# Patient Record
Sex: Female | Born: 1955 | Race: White | Hispanic: No | Marital: Married | Smoking: Former smoker
Health system: Southern US, Community
[De-identification: ages and names within clinical notes are randomized; demographics above are authoritative.]

## PROBLEM LIST (undated history)

## (undated) DIAGNOSIS — E785 Hyperlipidemia, unspecified: Secondary | ICD-10-CM

## (undated) DIAGNOSIS — D649 Anemia, unspecified: Secondary | ICD-10-CM

## (undated) DIAGNOSIS — F419 Anxiety disorder, unspecified: Secondary | ICD-10-CM

## (undated) DIAGNOSIS — Z9889 Other specified postprocedural states: Secondary | ICD-10-CM

## (undated) DIAGNOSIS — Z8489 Family history of other specified conditions: Secondary | ICD-10-CM

## (undated) DIAGNOSIS — R7303 Prediabetes: Secondary | ICD-10-CM

## (undated) DIAGNOSIS — T4145XA Adverse effect of unspecified anesthetic, initial encounter: Secondary | ICD-10-CM

## (undated) DIAGNOSIS — L409 Psoriasis, unspecified: Secondary | ICD-10-CM

## (undated) DIAGNOSIS — T8859XA Other complications of anesthesia, initial encounter: Secondary | ICD-10-CM

## (undated) DIAGNOSIS — I6529 Occlusion and stenosis of unspecified carotid artery: Secondary | ICD-10-CM

## (undated) DIAGNOSIS — R51 Headache: Secondary | ICD-10-CM

## (undated) DIAGNOSIS — R112 Nausea with vomiting, unspecified: Secondary | ICD-10-CM

## (undated) DIAGNOSIS — K219 Gastro-esophageal reflux disease without esophagitis: Secondary | ICD-10-CM

## (undated) DIAGNOSIS — H269 Unspecified cataract: Secondary | ICD-10-CM

## (undated) DIAGNOSIS — I2510 Atherosclerotic heart disease of native coronary artery without angina pectoris: Secondary | ICD-10-CM

## (undated) DIAGNOSIS — M1990 Unspecified osteoarthritis, unspecified site: Secondary | ICD-10-CM

## (undated) DIAGNOSIS — E039 Hypothyroidism, unspecified: Secondary | ICD-10-CM

## (undated) DIAGNOSIS — G709 Myoneural disorder, unspecified: Secondary | ICD-10-CM

## (undated) HISTORY — PX: DILATION AND CURETTAGE OF UTERUS: SHX78

## (undated) HISTORY — PX: TUBAL LIGATION: SHX77

---

## 2004-12-09 ENCOUNTER — Ambulatory Visit: Payer: Self-pay

## 2007-02-05 ENCOUNTER — Other Ambulatory Visit: Admission: RE | Admit: 2007-02-05 | Discharge: 2007-02-05 | Payer: Self-pay | Admitting: Gynecology

## 2007-03-26 HISTORY — PX: ABDOMINAL HYSTERECTOMY: SHX81

## 2007-04-01 ENCOUNTER — Ambulatory Visit (HOSPITAL_BASED_OUTPATIENT_CLINIC_OR_DEPARTMENT_OTHER): Admission: RE | Admit: 2007-04-01 | Discharge: 2007-04-01 | Payer: Self-pay | Admitting: Gynecology

## 2007-04-01 ENCOUNTER — Encounter: Payer: Self-pay | Admitting: Gynecology

## 2008-04-03 ENCOUNTER — Ambulatory Visit: Payer: Self-pay | Admitting: Internal Medicine

## 2008-04-28 ENCOUNTER — Ambulatory Visit: Payer: Self-pay | Admitting: Gynecology

## 2008-04-28 ENCOUNTER — Encounter: Payer: Self-pay | Admitting: Gynecology

## 2008-04-28 ENCOUNTER — Other Ambulatory Visit: Admission: RE | Admit: 2008-04-28 | Discharge: 2008-04-28 | Payer: Self-pay | Admitting: Gynecology

## 2008-07-24 ENCOUNTER — Ambulatory Visit: Payer: Self-pay | Admitting: Gynecology

## 2008-07-28 ENCOUNTER — Ambulatory Visit: Payer: Self-pay | Admitting: Gynecology

## 2008-08-25 HISTORY — PX: THYROID SURGERY: SHX805

## 2009-04-25 DIAGNOSIS — E039 Hypothyroidism, unspecified: Secondary | ICD-10-CM

## 2009-04-25 HISTORY — DX: Hypothyroidism, unspecified: E03.9

## 2009-05-29 ENCOUNTER — Ambulatory Visit: Payer: Self-pay | Admitting: Internal Medicine

## 2010-10-30 ENCOUNTER — Ambulatory Visit: Payer: Self-pay | Admitting: Internal Medicine

## 2011-01-07 NOTE — H&P (Signed)
NAME:  Annette [name], Annette [name] NO.:  [id number]   MEDICAL RECORD NO.:  [id number]          PATIENT TYPE:  AMB   LOCATION:  [facility]                         FACILITY:  [facility]   PHYSICIAN:  Juan H. Lily Peer, M.D.DATE OF BIRTH:  March 31, 1956   DATE OF ADMISSION:  DATE OF DISCHARGE:                              HISTORY & PHYSICAL   The patient is scheduled for surgery in [facility] on  Thursday, August 7th at 1:00 p.m.  Please have history and physical  available.   CHIEF COMPLAINT:  Menomenorrhagia and left ovarian cyst.   HISTORY:  The patient is a 55 year old gravida 3, para 2, AB 1, who is  scheduled to undergo a laparoscopic-assisted vaginal hysterectomy and  bilateral salpingo-oophorectomy in August 2008, secondary to  menomenorrhagia and ovarian cyst.  Her evaluation had consisted of  endometrial biopsy with proliferative endometrium.  Sonohystogram  demonstrated endometrial polyp, and her ultrasound recently had  demonstrated at that time of the sonohystogram that she had a new  vascular thin-wall cyst measuring 25 x 17 x 16 mm on the left ovary and  a thickened endometrium.  She had been kept on Megace 20 mg that was  increased to 40 mg to help her bleeding.   PAST MEDICAL HISTORY:  SHE IS ALLERGIC TO PENICILLIN.   PAST SURGICAL HISTORY:  She has had two cesarean sections.  She has a  midline Pfannenstiel scar.  She has had one spontaneous AB.   MEDICATIONS:  1. Cymbalta 30 mg q. daily for depression.  2. Megace 40 mg she is taking b.i.d. to help with her bleeding.  She      was treated for upper respiratory tract infection back in July, and      her hospitalizations have included two c-sections and one D&C.   FAMILY HISTORY:  Sister with non-insulin dependent diabetes.   PHYSICAL EXAMINATION:  VITAL SIGNS:  The patient weighs 162 pounds.  HEENT:  Unremarkable.  GENERAL:  Well-developed, well-nourished female in no acute distress.  NECK:  Supple,  trachea midline. No carotid bruits, no thyromegaly.  LUNGS:  Clear to auscultation without rhonchi or wheezes.  HEART:  Regular rate and rhythm without any murmurs or gallops.  BREAST:  Exam not done.  ABDOMEN:  Soft, nontender, no rebound or guarding.  PELVIC:  Bartholin's, urethra and Skene was within normal limits.  Vagina with some menstrual blood present.  Bimanual examination:  Anteverted uterus, upper limits of normal.  No palpable adnexal masses.  RECTAL:  Exam deferred.   ASSESSMENT:  A 55 year old gravida 3, para 2, AB 1 with menomenorrhagia,  evidence of endometrial polyp and right ovarian cyst.  The patient now  menopausal.  The patient is scheduled to proceed with a laparoscopic-  assisted vaginal hysterectomy and bilateral salpingo-oophorectomy.  The  risks, benefits and pros and cons of the operation have been discussed,  including infection, bleeding, trauma to internal organs.  In the event  of technical difficulty, open laparotomy technique may need to be  performed to complete the operation.  The risk of blood transfusion and  potential risk  of anaphylactic reaction, hepatitis and AIDs were  discussed as well.  All of these issues were discussed with the patient,  and all questions were answered and will follow accordingly to plan.  The patient was scheduled for laparoscopic-assisted vaginal  hysterectomy, bilateral salpingo-oophorectomy Thursday August 7th at  1:00 p.m. at [facility].  Please have history and  physical available.      Juan H. Lily Peer, M.D.  Electronically Signed     JHF/MEDQ  D:  03/31/2007  T:  03/31/2007  Job:  [id number]

## 2011-01-07 NOTE — Op Note (Signed)
NAME:  Annette [name], Annette [name] NO.:  [id number]   MEDICAL RECORD NO.:  [id number]          PATIENT TYPE:  AMB   LOCATION:  [facility]                         FACILITY:  [facility]   PHYSICIAN:  Juan H. Lily Peer, M.D.DATE OF BIRTH:  [birth year]   DATE OF PROCEDURE:  04/01/2007  DATE OF DISCHARGE:                               OPERATIVE REPORT   SURGEON:  Juan H. Lily Peer, M.D.   FIRST ASSISTANT:  Colin Broach, M.D.   INDICATIONS FOR OPERATION:  A 55 year old, gravida 3, para 2, AB1 with  symptomatic leiomyomatous uteri contributing to dysmenorrhea and  menorrhagia, along with a small left ovarian cyst.   PREOPERATIVE DIAGNOSES:  1. Symptomatic leiomyomatous uteri.  2. Left ovarian cyst.   POSTOPERATIVE DIAGNOSES:  1. Symptomatic leiomyomatous uteri.  2. Left ovarian cyst.   ANESTHESIA:  General endotracheal anesthesia.   SURGEON:  Juan H. Lily Peer, M.D.   FIRST ASSISTANT:  Colin Broach, M.D.   PROCEDURE PERFORMED:  Laparoscopic-assisted vaginal hysterectomy with  bilateral salpingo-oophorectomy.   FINDINGS:  A globular-appearing uterus, small what appeared to be a  hemorrhagic cyst of the left ovary.  No other abnormalities noted in the  pelvic cavity.   DESCRIPTION OF OPERATION:  After the patient was adequately counseled,  she was taken to the operating room where she underwent a successful  general endotracheal anesthesia.  The abdomen and vagina were prepped  and draped in usual sterile fashion.  The patient had received a gram of  cefoxitin IV for prophylaxis, and a Foley catheter had been placed for  monitoring urinary output.  She did have PSA stockings for DVT  prophylaxis.  Her legs were placed in the low lithotomy position.  Once  the drapes were in place, a small stab incision was made underneath the  umbilicus, and with the OptiVu, the peritoneal cavity was entered under  direct visualization with a laparoscopic and the OptiVu trocar.  The  sheath was left in place.  The trocar was removed.  The scope was  inserted.  The pneumoperitoneum was established to approximately 3  liters of carbon oxide.  Two additional ports were made approximately  five fingerbreadths from the midline in the lower abdomen on each side  under laparoscopic guidance.  Attention was placed to the right adnexal  region.  The right tube and ovary were placed on traction.  The right  infundibulopelvic ligament was identified, and the ureter was found to  be far from the infundibulopelvic ligament, and with the tripolar unit,  the infundibulopelvic ligament was cauterized and transected as was the  remainder of the round ligament, and the bladder flap was established  with the Endo shears to the level of the anterior cervical os.  Similar  procedure was carried out on the contralateral side, and both sides of  the parametrial tissue was cauterized and transected to include up to  the level of the uterine artery to the level of both lateral fornices.  The second part of the operation consisted of placing the patient in  high lithotomy position and completing the hysterectomy.  A short-  weighted speculum  was placed in the posterior vaginal vault.  Anterior,  posterior and cervical lip were grasped with a Lahey thyroid clamp and  placed under traction.  One percent lidocaine with 1:100,000 epinephrine  was infiltrated to the cervicovaginal fold in a circumferential manner  for a total 10 mL.  With a scalpel, a circumferential incision was made  at the cervicovaginal fold, and the posterior colpotomy was  accomplished, and the short-weighted speculum was changed for a long-  weighted billed speculum.  Both uterosacral ligaments were then clamped,  cut and suture ligated with 0 Vicryl, sutured and tagged.  An anterior  colpotomy was established, and the Deaver retractor was placed in the  anterior peritoneal cavity.  The remaining broad and cardinal ligaments   were serially clamped, cut and suture ligated with 0 Vicryl suture.  The  uterus required morcellation in an effort to be removed from the pelvic  cavity where the remaining pedicles were clamped, cut and suture  ligated.  The uterus, tubes and ovary were passed off the operative  field.  The area was copiously irrigated with normal saline solution.  After ascertaining adequate hemostasis, the short-weighted speculum was  then placed, and the long-weighted billed speculum was passed off the  operative field.  The posterior vaginal mucosa and peritoneum were  secured with a running stitch of 0 Vicryl suture in a basal stitch like  fashion, and the remaining vaginal cuff was then closed with interrupted  sutures of 0 Vicryl suture.  Once again, a second look of the pelvic  cavity was accomplished with the laparoscope.  After the  pneumoperitoneum was reestablished, the pelvic cavity was copiously  irrigated with normal saline solution.  Both infundibulopelvic ligaments  appeared to be dry, and the vaginal cuff was dry as well.  The  pneumoperitoneum was removed.  Both 5-mm trocars and 10-mm cord trocar  were removed.  The subumbilical fascial incision was closed with running  stitch of 0 Vicryl suture, and the subcutaneous tissue was  reapproximated with 3-0 Vicryl suture.  Both 5-mm trocar sites were  closed with Dermabond glue superficially.  Three Band-Aids were placed  on each incision port.  Prior to that, 0.25% percent Marcaine was  infiltrated at all three incision sites for postoperative analgesia for  a total 10 mL.  The patient was extubated and transferred to recovery  room with stable vital signs.  Blood loss was 300 mL, IV fluids 1700 mL  of lactated Ringer's and urine output was 350 mL.  Uterine weight was  232 grams.      Juan H. Lily Peer, M.D.  Electronically Signed     JHF/MEDQ  D:  04/01/2007  T:  04/02/2007  Job:  [id number]

## 2011-06-09 LAB — CBC
HCT: 29.3 — ABNORMAL LOW
MCV: 88.9
Platelets: 362
RDW: 13.3

## 2011-06-09 LAB — DIFFERENTIAL
Basophils Absolute: 0
Basophils Relative: 0
Eosinophils Absolute: 0.1
Eosinophils Relative: 1

## 2012-03-23 ENCOUNTER — Ambulatory Visit: Payer: Self-pay | Admitting: Internal Medicine

## 2012-11-07 ENCOUNTER — Emergency Department: Payer: Self-pay | Admitting: Emergency Medicine

## 2013-03-24 ENCOUNTER — Ambulatory Visit: Payer: Self-pay | Admitting: Internal Medicine

## 2013-10-26 ENCOUNTER — Ambulatory Visit: Payer: Self-pay | Admitting: Internal Medicine

## 2013-10-28 ENCOUNTER — Ambulatory Visit: Payer: Self-pay | Admitting: Internal Medicine

## 2014-01-04 DIAGNOSIS — M1990 Unspecified osteoarthritis, unspecified site: Secondary | ICD-10-CM | POA: Insufficient documentation

## 2014-01-04 DIAGNOSIS — G43909 Migraine, unspecified, not intractable, without status migrainosus: Secondary | ICD-10-CM | POA: Insufficient documentation

## 2014-01-04 DIAGNOSIS — L409 Psoriasis, unspecified: Secondary | ICD-10-CM | POA: Insufficient documentation

## 2014-01-31 ENCOUNTER — Ambulatory Visit: Payer: Self-pay | Admitting: Rheumatology

## 2014-05-02 DIAGNOSIS — S83239A Complex tear of medial meniscus, current injury, unspecified knee, initial encounter: Secondary | ICD-10-CM | POA: Insufficient documentation

## 2014-05-12 ENCOUNTER — Ambulatory Visit: Payer: Self-pay | Admitting: Unknown Physician Specialty

## 2014-05-12 HISTORY — PX: KNEE ARTHROSCOPY W/ PARTIAL MEDIAL MENISCECTOMY: SHX1882

## 2014-08-15 DIAGNOSIS — M25461 Effusion, right knee: Secondary | ICD-10-CM | POA: Insufficient documentation

## 2014-12-20 ENCOUNTER — Other Ambulatory Visit: Payer: Self-pay | Admitting: Internal Medicine

## 2014-12-20 DIAGNOSIS — E785 Hyperlipidemia, unspecified: Secondary | ICD-10-CM | POA: Insufficient documentation

## 2014-12-20 DIAGNOSIS — F32A Depression, unspecified: Secondary | ICD-10-CM | POA: Insufficient documentation

## 2014-12-20 DIAGNOSIS — Z1231 Encounter for screening mammogram for malignant neoplasm of breast: Secondary | ICD-10-CM

## 2014-12-20 DIAGNOSIS — E890 Postprocedural hypothyroidism: Secondary | ICD-10-CM | POA: Insufficient documentation

## 2014-12-20 DIAGNOSIS — F419 Anxiety disorder, unspecified: Secondary | ICD-10-CM

## 2014-12-20 DIAGNOSIS — F329 Major depressive disorder, single episode, unspecified: Secondary | ICD-10-CM | POA: Insufficient documentation

## 2015-01-09 ENCOUNTER — Ambulatory Visit
Admission: RE | Admit: 2015-01-09 | Discharge: 2015-01-09 | Disposition: A | Discharge status: Home / Self Care | Payer: PRIVATE HEALTH INSURANCE | Source: Ambulatory Visit | Attending: Internal Medicine | Admitting: Internal Medicine

## 2015-01-09 DIAGNOSIS — Z1231 Encounter for screening mammogram for malignant neoplasm of breast: Secondary | ICD-10-CM | POA: Diagnosis not present

## 2015-08-22 ENCOUNTER — Other Ambulatory Visit: Payer: Self-pay | Admitting: Unknown Physician Specialty

## 2015-08-22 DIAGNOSIS — M25562 Pain in left knee: Secondary | ICD-10-CM

## 2015-09-05 ENCOUNTER — Ambulatory Visit: Payer: PRIVATE HEALTH INSURANCE

## 2015-09-11 ENCOUNTER — Encounter: Payer: Self-pay | Admitting: *Deleted

## 2015-09-14 ENCOUNTER — Encounter
Admission: RE | Disposition: A | Discharge status: Home / Self Care | Payer: Self-pay | Source: Ambulatory Visit | Attending: Unknown Physician Specialty

## 2015-09-14 ENCOUNTER — Ambulatory Visit: Payer: PRIVATE HEALTH INSURANCE | Admitting: Anesthesiology

## 2015-09-14 ENCOUNTER — Ambulatory Visit
Admission: RE | Admit: 2015-09-14 | Discharge: 2015-09-14 | Disposition: A | Discharge status: Home / Self Care | Payer: PRIVATE HEALTH INSURANCE | Source: Ambulatory Visit | Attending: Unknown Physician Specialty | Admitting: Unknown Physician Specialty

## 2015-09-14 ENCOUNTER — Encounter: Payer: Self-pay | Admitting: *Deleted

## 2015-09-14 DIAGNOSIS — M94262 Chondromalacia, left knee: Secondary | ICD-10-CM | POA: Diagnosis not present

## 2015-09-14 DIAGNOSIS — E890 Postprocedural hypothyroidism: Secondary | ICD-10-CM | POA: Diagnosis not present

## 2015-09-14 DIAGNOSIS — M1991 Primary osteoarthritis, unspecified site: Secondary | ICD-10-CM | POA: Diagnosis not present

## 2015-09-14 DIAGNOSIS — Z9889 Other specified postprocedural states: Secondary | ICD-10-CM | POA: Insufficient documentation

## 2015-09-14 DIAGNOSIS — Z90710 Acquired absence of both cervix and uterus: Secondary | ICD-10-CM | POA: Insufficient documentation

## 2015-09-14 DIAGNOSIS — S83242A Other tear of medial meniscus, current injury, left knee, initial encounter: Secondary | ICD-10-CM | POA: Insufficient documentation

## 2015-09-14 DIAGNOSIS — Z803 Family history of malignant neoplasm of breast: Secondary | ICD-10-CM | POA: Insufficient documentation

## 2015-09-14 DIAGNOSIS — Z881 Allergy status to other antibiotic agents status: Secondary | ICD-10-CM | POA: Insufficient documentation

## 2015-09-14 DIAGNOSIS — Z8249 Family history of ischemic heart disease and other diseases of the circulatory system: Secondary | ICD-10-CM | POA: Insufficient documentation

## 2015-09-14 DIAGNOSIS — Z8261 Family history of arthritis: Secondary | ICD-10-CM | POA: Diagnosis not present

## 2015-09-14 DIAGNOSIS — E785 Hyperlipidemia, unspecified: Secondary | ICD-10-CM | POA: Diagnosis not present

## 2015-09-14 DIAGNOSIS — Z79899 Other long term (current) drug therapy: Secondary | ICD-10-CM | POA: Insufficient documentation

## 2015-09-14 DIAGNOSIS — X58XXXA Exposure to other specified factors, initial encounter: Secondary | ICD-10-CM | POA: Insufficient documentation

## 2015-09-14 DIAGNOSIS — Z8262 Family history of osteoporosis: Secondary | ICD-10-CM | POA: Insufficient documentation

## 2015-09-14 DIAGNOSIS — Z886 Allergy status to analgesic agent status: Secondary | ICD-10-CM | POA: Insufficient documentation

## 2015-09-14 DIAGNOSIS — Z8371 Family history of colonic polyps: Secondary | ICD-10-CM | POA: Diagnosis not present

## 2015-09-14 DIAGNOSIS — M1712 Unilateral primary osteoarthritis, left knee: Secondary | ICD-10-CM | POA: Insufficient documentation

## 2015-09-14 DIAGNOSIS — Z880 Allergy status to penicillin: Secondary | ICD-10-CM | POA: Insufficient documentation

## 2015-09-14 DIAGNOSIS — S83222A Peripheral tear of medial meniscus, current injury, left knee, initial encounter: Secondary | ICD-10-CM | POA: Diagnosis present

## 2015-09-14 HISTORY — DX: Headache: R51

## 2015-09-14 HISTORY — DX: Hyperlipidemia, unspecified: E78.5

## 2015-09-14 HISTORY — PX: KNEE ARTHROSCOPY: SHX127

## 2015-09-14 HISTORY — DX: Psoriasis, unspecified: L40.9

## 2015-09-14 HISTORY — DX: Hypothyroidism, unspecified: E03.9

## 2015-09-14 HISTORY — DX: Unspecified osteoarthritis, unspecified site: M19.90

## 2015-09-14 HISTORY — DX: Anxiety disorder, unspecified: F41.9

## 2015-09-14 SURGERY — ARTHROSCOPY, KNEE
Anesthesia: General | Laterality: Left

## 2015-09-14 MED ORDER — DEXAMETHASONE SODIUM PHOSPHATE 4 MG/ML IJ SOLN
INTRAMUSCULAR | Status: DC | PRN
  Administered 2015-09-14: 8 mg via INTRAVENOUS

## 2015-09-14 MED ORDER — LACTATED RINGERS IV SOLN
INTRAVENOUS | Status: DC
  Administered 2015-09-14 (×2): via INTRAVENOUS

## 2015-09-14 MED ORDER — NORCO 5-325 MG PO TABS: 1.0000 | ORAL_TABLET | Freq: Four times a day (QID) | ORAL | Status: DC | PRN

## 2015-09-14 MED ORDER — LIDOCAINE HCL (CARDIAC) 20 MG/ML IV SOLN
INTRAVENOUS | Status: DC | PRN
  Administered 2015-09-14: 50 mg via INTRATRACHEAL

## 2015-09-14 MED ORDER — ONDANSETRON HCL 4 MG/2ML IJ SOLN
INTRAMUSCULAR | Status: DC | PRN
  Administered 2015-09-14: 4 mg via INTRAVENOUS

## 2015-09-14 MED ORDER — ONDANSETRON HCL 4 MG/2ML IJ SOLN: 4.0000 mg | Freq: Once | INTRAMUSCULAR | Status: DC | PRN

## 2015-09-14 MED ORDER — PROPOFOL 10 MG/ML IV BOLUS
INTRAVENOUS | Status: DC | PRN
  Administered 2015-09-14: 50 mg via INTRAVENOUS
  Administered 2015-09-14: 150 mg via INTRAVENOUS

## 2015-09-14 MED ORDER — FENTANYL CITRATE (PF) 100 MCG/2ML IJ SOLN
INTRAMUSCULAR | Status: DC | PRN
  Administered 2015-09-14 (×2): 50 ug via INTRAVENOUS

## 2015-09-14 MED ORDER — FENTANYL CITRATE (PF) 100 MCG/2ML IJ SOLN
25.0000 ug | INTRAMUSCULAR | Status: DC | PRN
  Administered 2015-09-14 (×2): 25 ug via INTRAVENOUS

## 2015-09-14 MED ORDER — GLYCOPYRROLATE 0.2 MG/ML IJ SOLN
INTRAMUSCULAR | Status: DC | PRN
  Administered 2015-09-14: 0.2 mg via INTRAVENOUS

## 2015-09-14 MED ORDER — BUPIVACAINE HCL (PF) 0.5 % IJ SOLN
INTRAMUSCULAR | Status: DC | PRN
  Administered 2015-09-14: 20 mL

## 2015-09-14 MED ORDER — MIDAZOLAM HCL 5 MG/5ML IJ SOLN
INTRAMUSCULAR | Status: DC | PRN
  Administered 2015-09-14: 2 mg via INTRAVENOUS

## 2015-09-14 SURGICAL SUPPLY — 53 items
ARTHROWAND PARAGON T2 (SURGICAL WAND) ×2
BLADE ABRADER 4.5 (BLADE) ×1 IMPLANT
BLADE FULL RADIUS 3.5 (BLADE) IMPLANT
BLADE SHAVER 4.5X7 STR FR (MISCELLANEOUS) ×1 IMPLANT
BLADE SHAVER AGGRES 5.5  STR (CUTTER)
BLADE SHAVER AGGRES 5.5 STR (CUTTER) IMPLANT
BNDG ESMARK 6X12 TAN STRL LF (GAUZE/BANDAGES/DRESSINGS) IMPLANT
BUR 5.5 NOTCHBLASTER STR (BURR) IMPLANT
BUR ABRADER 4.0 W/FLUTE AQUA (MISCELLANEOUS) IMPLANT
BUR ABRADER 5.5 BLK (MISCELLANEOUS) IMPLANT
BUR ACROMIONIZER 4.0 (BURR) IMPLANT
BUR BR 5.5 WIDE MOUTH (BURR) IMPLANT
BUR RADIUS 3.5 (BURR) IMPLANT
BUR RADIUS 4.0X18.5 (BURR) IMPLANT
BUR ROUND 5.5 (BURR) IMPLANT
BURR 5.5 NOTCHBLASTER STR (BURR)
BURR ABRADER 4.0 W/FLUTE AQUA (MISCELLANEOUS) ×2
BURR ABRADER 5.5 BLK (MISCELLANEOUS)
BURR ROUND 12 FLUTE 4.0MM (BURR) IMPLANT
CANNULA THRD 8.5X72 DISP (CANNULA) IMPLANT
COVER LIGHT HANDLE UNIVERSAL (MISCELLANEOUS) ×4 IMPLANT
CUFF TOURN SGL QUICK 24 (TOURNIQUET CUFF) ×2
CUFF TOURN SGL QUICK 30 (MISCELLANEOUS)
CUFF TOURN SGL QUICK 34 (TOURNIQUET CUFF)
CUFF TRNQT CYL 24X4X40X1 (TOURNIQUET CUFF) IMPLANT
CUFF TRNQT CYL 34X4X40X1 (TOURNIQUET CUFF) IMPLANT
CUFF TRNQT CYL LO 30X4X (MISCELLANEOUS) IMPLANT
CUTTER SLOTTED WHISKER 4.0 (BURR) IMPLANT
DRAPE LEGGINS SURG 28X43 STRL (DRAPES) ×2 IMPLANT
DURAPREP 26ML APPLICATOR (WOUND CARE) ×2 IMPLANT
GAUZE SPONGE 4X4 12PLY STRL (GAUZE/BANDAGES/DRESSINGS) ×2 IMPLANT
GLOVE BIO SURGEON STRL SZ7.5 (GLOVE) ×2 IMPLANT
GLOVE BIO SURGEON STRL SZ8 (GLOVE) ×2 IMPLANT
GLOVE INDICATOR 8.0 STRL GRN (GLOVE) ×2 IMPLANT
GOWN STRL REIN 2XL XLG LVL4 (GOWN DISPOSABLE) ×4 IMPLANT
GOWN STRL REUS W/TWL 2XL LVL3 (GOWN DISPOSABLE) IMPLANT
IV LACTATED RINGER IRRG 3000ML (IV SOLUTION) ×4
IV LR IRRIG 3000ML ARTHROMATIC (IV SOLUTION) ×2 IMPLANT
KIT ROOM TURNOVER OR (KITS) ×2 IMPLANT
MANIFOLD 4PT FOR NEPTUNE1 (MISCELLANEOUS) ×2 IMPLANT
PACK ARTHROSCOPY KNEE (MISCELLANEOUS) ×2 IMPLANT
SET TUBE SUCT SHAVER OUTFL 24K (TUBING) ×2 IMPLANT
SOL PREP PVP 2OZ (MISCELLANEOUS) ×2
SOLUTION PREP PVP 2OZ (MISCELLANEOUS) ×1 IMPLANT
SUT ETHILON 3-0 FS-10 30 BLK (SUTURE) ×2
SUTURE EHLN 3-0 FS-10 30 BLK (SUTURE) ×1 IMPLANT
TAPE MICROFOAM 4IN (TAPE) ×2 IMPLANT
TUBING ARTHRO INFLOW-ONLY STRL (TUBING) ×2 IMPLANT
WAND ARTHRO PARAGON T2 (SURGICAL WAND) IMPLANT
WAND HAND CNTRL MULTIVAC 50 (MISCELLANEOUS) ×1 IMPLANT
WAND HAND CNTRL MULTIVAC 90 (MISCELLANEOUS) IMPLANT
WAND MEGAVAC 90 (MISCELLANEOUS) IMPLANT
WRAP KNEE W/COLD PACKS 25.5X14 (SOFTGOODS) ×1 IMPLANT

## 2015-09-14 NOTE — Anesthesia Postprocedure Evaluation (Signed)
Anesthesia Post Note  Patient: Annette [name]  Procedure(s) Performed: Procedure(s) (LRB): ARTHROSCOPY KNEE (Left)  Patient location during evaluation: PACU Anesthesia Type: General Level of consciousness: awake and alert and oriented Pain management: satisfactory to patient Vital Signs Assessment: post-procedure vital signs reviewed and stable Respiratory status: spontaneous breathing, nonlabored ventilation and respiratory function stable Cardiovascular status: blood pressure returned to baseline and stable Postop Assessment: Adequate PO intake and No signs of nausea or vomiting Anesthetic complications: no    Raliegh Ip

## 2015-09-14 NOTE — Anesthesia Procedure Notes (Signed)
Procedure Name: LMA Insertion Date/Time: 09/14/2015 10:31 AM Performed by: Londell Moh Pre-anesthesia Checklist: Patient identified, Emergency Drugs available, Suction available, Timeout performed and Patient being monitored Patient Re-evaluated:Patient Re-evaluated prior to inductionOxygen Delivery Method: Circle system utilized Preoxygenation: Pre-oxygenation with 100% oxygen Intubation Type: IV induction LMA: LMA inserted LMA Size: 4.0 Number of attempts: 1 Placement Confirmation: positive ETCO2 and breath sounds checked- equal and bilateral Tube secured with: Tape

## 2015-09-14 NOTE — H&P (Signed)
  H and P reviewed. No changes. Uploaded at later date. 

## 2015-09-14 NOTE — Discharge Instructions (Signed)
General Anesthesia, Adult, Care After °Refer to this sheet in the next few weeks. These instructions provide you with information on caring for yourself after your procedure. Your health care provider may also give you more specific instructions. Your treatment has been planned according to current medical practices, but problems sometimes occur. Call your health care provider if you have any problems or questions after your procedure. °WHAT TO EXPECT AFTER THE PROCEDURE °After the procedure, it is typical to experience: °· Sleepiness. °· Nausea and vomiting. °HOME CARE INSTRUCTIONS °· For the first 24 hours after general anesthesia: °¨ Have a responsible person with you. °¨ Do not drive a car. If you are alone, do not take public transportation. °¨ Do not drink alcohol. °¨ Do not take medicine that has not been prescribed by your health care provider. °¨ Do not sign important papers or make important decisions. °¨ You may resume a normal diet and activities as directed by your health care provider. °· Change bandages (dressings) as directed. °· If you have questions or problems that seem related to general anesthesia, call the hospital and ask for the anesthetist or anesthesiologist on call. °SEEK MEDICAL CARE IF: °· You have nausea and vomiting that continue the day after anesthesia. °· You develop a rash. °SEEK IMMEDIATE MEDICAL CARE IF:  °· You have difficulty breathing. °· You have chest pain. °· You have any allergic problems. °  °This information is not intended to replace advice given to you by your health care provider. Make sure you discuss any questions you have with your health care provider. °  °Document Released: 11/17/2000 Document Revised: 09/01/2014 Document Reviewed: 12/10/2011 °Elsevier Interactive Patient Education ©2016 [employer] ° ° °[facility]  °Jaquese Irving B. Elecia Serafin, Jr., M.D. [phone]  ° °KNEE ARTHROSCOPY POST OPERATION INSTRUCTIONS: ° °PLEASE  READ THESE INSTRUCTIONS ABOUT POST OPERATION CARE. THEY WILL ANSWER MOST OF YOUR QUESTIONS.  °You have been given a prescription for pain. Please take as directed for pain.  °You can walk, keeping the knee slightly stiff-avoid doing too much bending the first day. (if ACL reconstruction is performed, keep brace locked in extension when walking.)  °You will use crutches or cane if needed. Can weight bear as tolerated  °Plan to take three to four days off from work. You can resume work when you are comfortable. (This can be a week or more, depending on the type of work you do.)  °To reduce pain and swelling, place one to two pillows under the knee the first two or three days when sitting or lying. An ice pack may be placed on top of the area over the dressing. Instructions for making homemade icepack are as follow:  °Flexible homemade alcohol water ice pack  °2 cups water  °1 cup rubbing alcohol  °food coloring for the blue tint (optional)  °2 zip-top bags - gallon-size  °Mix the water and alcohol together in one of your zip-top bags and add food coloring. Release as much air as possible and seal the bag. Place in freezer for at least 12 hours.  °The small incisions in your knee are closed with nylon stitches. They will be removed in the office.  °The bulky dressing may be removed in the third day after surgery. (If ACL surgery-DO NOT REMOVE BANDAGES). Put a waterproof band-aid over each stitch. Do not put any creams or ointments on wounds. You may shower at this time, but change waterproof band-aids after showering. KEEP INCISIONS CLEAN   AND DRY UNTIL YOU RETURN TO THE OFFICE.  °Sometimes the operative area remains somewhat painful and swollen for several weeks. This is usually nothing to worry about, but call if you have any excessive symptoms, especially fever. It is not unusual to have a low grade fever of 99 degrees for the first few days. If persist after 3-4 days call the office. It is not uncommon for the pain  to be a little worse on the third day after surgery.  °Begin doing gentle exercises right away. They will be limited by the amount of pain and swelling you have.  Exercising will reduce the swelling, increase motion, and prevent muscle weakness. Exercises: Straight leg raising and gentle knee bending.  °Take 81 milligram aspirin twice a day for 2 weeks after meals or milk. This along with elevation will help reduce the possibility of phlebitis in your operated leg.  °Avoid strenuous athletics for a minimum of 4 to 6 weeks after arthroscopic surgery (approximately five months if ACL surgery).  °If the surgery included ACL reconstruction the brace that is supplied to the extremity post surgery is to be locked in extension when you are asleep and is to be locked in extension when you are ambulating. It can be unlocked for exercises or sitting.  °Keep your post surgery appointment that has been made for you. If you do not remember the date call [phone]. Your follow up appointment should be between 7-10 days.  ° °

## 2015-09-14 NOTE — Transfer of Care (Signed)
Immediate Anesthesia Transfer of Care Note  Patient: Annette [name]  Procedure(s) Performed: Procedure(s): ARTHROSCOPY KNEE (Left)  Patient Location: PACU  Anesthesia Type: General LMA  Level of Consciousness: awake, alert  and patient cooperative  Airway and Oxygen Therapy: Patient Spontanous Breathing and Patient connected to supplemental oxygen  Post-op Assessment: Post-op Vital signs reviewed, Patient's Cardiovascular Status Stable, Respiratory Function Stable, Patent Airway and No signs of Nausea or vomiting  Post-op Vital Signs: Reviewed and stable  Complications: No apparent anesthesia complications

## 2015-09-14 NOTE — Anesthesia Preprocedure Evaluation (Signed)
Anesthesia Evaluation  Patient identified by MRN, date of birth, ID band  Reviewed: Allergy & Precautions, H&P , NPO status , Patient's Chart, lab work & pertinent test results  Airway Mallampati: II  TM Distance: >3 FB Neck ROM: full    Dental no notable dental hx.    Pulmonary former smoker,    Pulmonary exam normal        Cardiovascular  Rhythm:regular Rate:Normal     Neuro/Psych    GI/Hepatic   Endo/Other  Hypothyroidism   Renal/GU      Musculoskeletal   Abdominal   Peds  Hematology   Anesthesia Other Findings   Reproductive/Obstetrics                             Anesthesia Physical Anesthesia Plan  ASA: II  Anesthesia Plan: General LMA   Post-op Pain Management:    Induction:   Airway Management Planned:   Additional Equipment:   Intra-op Plan:   Post-operative Plan:   Informed Consent: I have reviewed the patients History and Physical, chart, labs and discussed the procedure including the risks, benefits and alternatives for the proposed anesthesia with the patient or authorized representative who has indicated his/her understanding and acceptance.     Plan Discussed with: CRNA  Anesthesia Plan Comments:         Anesthesia Quick Evaluation

## 2015-09-14 NOTE — Op Note (Signed)
Patient: Annette [name], Annette [name].  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   History: Patient's had a long history of left knee pain.  The plain films revealed some medial compartment narrowing .  The patient had an MRI which revealed torn medial meniscus and some medial compartment chondral changes.The patient was scheduled for surgery due to persistent discomfort despite conservative treatment.  The patient was taken the operating room where satisfactory general anesthesia was achieved. A tourniquet and leg holder were was applied to the left thigh. A well leg support was applied to the nonoperative extremity. The left knee was prepped and draped in usual fashion for an arthroscopic procedure. An inflow cannula was introduced superomedially. The joint was distended with lactated Ringer's. Scope was introduced through an inferolateral puncture wound and a probe through an inferomedial puncture wound. Inspection of the medial compartment revealed  a torn posterior horn of the medial meniscus along with a grade 3 chondral lesion in the mid weightbearing portion of the medial femoral condyle. I went ahead and performed a partial medial meniscectomy using combination of basket biters and a motorized resector. The remaining meniscal rim was contoured with an angled ArthroCare wand. I then debrided the medial femoral chondral lesion. It measured about 6 mm in diameter and about a centimeter and a half in length. I used a small curette to remove the calcified cartilage layer and then a small round power bur was used to lightly  decorticate the lesion followed by microfracture of the lesion with a 45 awl. Inspection of the intercondylar notch revealed intact cruciates. Inspection of the the lateral compartment revealed no meniscal or chondral pathology.   Trochlear groove was inspected and appeared to be fairly smooth.  Retropatellar surface was visualized. There were grade 3 chondral changes about the lateral facet. I went ahead and coblated the lesion with a [employer] ArthroCare wand. I observed patella tracking from the superomedial portal. The patella seemed to track fairly well.  The instruments were removed from the joint at this time. The puncture wounds were closed with 3-0 nylon in vertical mattress fashion. I injected each puncture wound with several cc of half percent Marcaine without epinephrine. Betadine was applied the wounds followed by sterile dressing. An ice pack was applied to the right knee. The patient was awakened and transferred to the stretcher bed. The patient was taken to the recovery room in satisfactory condition.  The tourniquet was not inflated during the course of the procedure. Blood loss was negligible.

## 2015-09-17 ENCOUNTER — Encounter: Payer: Self-pay | Admitting: Unknown Physician Specialty

## 2015-11-12 ENCOUNTER — Other Ambulatory Visit: Payer: Self-pay | Admitting: Unknown Physician Specialty

## 2015-11-12 DIAGNOSIS — S83222A Peripheral tear of medial meniscus, current injury, left knee, initial encounter: Secondary | ICD-10-CM

## 2015-12-04 ENCOUNTER — Ambulatory Visit: Payer: PRIVATE HEALTH INSURANCE

## 2016-06-23 ENCOUNTER — Encounter: Payer: Self-pay | Admitting: Emergency Medicine

## 2016-06-23 ENCOUNTER — Emergency Department: Payer: PRIVATE HEALTH INSURANCE

## 2016-06-23 ENCOUNTER — Emergency Department
Admission: EM | Admit: 2016-06-23 | Discharge: 2016-06-23 | Disposition: A | Discharge status: Home / Self Care | Payer: PRIVATE HEALTH INSURANCE | Attending: Emergency Medicine | Admitting: Emergency Medicine

## 2016-06-23 DIAGNOSIS — E039 Hypothyroidism, unspecified: Secondary | ICD-10-CM | POA: Diagnosis not present

## 2016-06-23 DIAGNOSIS — R197 Diarrhea, unspecified: Secondary | ICD-10-CM | POA: Diagnosis not present

## 2016-06-23 DIAGNOSIS — Z87891 Personal history of nicotine dependence: Secondary | ICD-10-CM | POA: Diagnosis not present

## 2016-06-23 DIAGNOSIS — R1032 Left lower quadrant pain: Secondary | ICD-10-CM | POA: Diagnosis not present

## 2016-06-23 DIAGNOSIS — Z79899 Other long term (current) drug therapy: Secondary | ICD-10-CM | POA: Insufficient documentation

## 2016-06-23 DIAGNOSIS — K625 Hemorrhage of anus and rectum: Secondary | ICD-10-CM

## 2016-06-23 DIAGNOSIS — K649 Unspecified hemorrhoids: Secondary | ICD-10-CM | POA: Insufficient documentation

## 2016-06-23 LAB — CBC
HCT: 45 % (ref 35.0–47.0)
Hemoglobin: 15.4 g/dL (ref 12.0–16.0)
MCH: 31.6 pg (ref 26.0–34.0)
MCHC: 34.2 g/dL (ref 32.0–36.0)
MCV: 92.4 fL (ref 80.0–100.0)
PLATELETS: 275 10*3/uL (ref 150–440)
RBC: 4.87 MIL/uL (ref 3.80–5.20)
RDW: 13.2 % (ref 11.5–14.5)
WBC: 13.6 10*3/uL — ABNORMAL HIGH (ref 3.6–11.0)

## 2016-06-23 LAB — COMPREHENSIVE METABOLIC PANEL
ALBUMIN: 4.5 g/dL (ref 3.5–5.0)
ALK PHOS: 79 U/L (ref 38–126)
ALT: 45 U/L (ref 14–54)
AST: 34 U/L (ref 15–41)
Anion gap: 10 (ref 5–15)
BUN: 16 mg/dL (ref 6–20)
CALCIUM: 9.7 mg/dL (ref 8.9–10.3)
CHLORIDE: 104 mmol/L (ref 101–111)
CO2: 24 mmol/L (ref 22–32)
CREATININE: 0.81 mg/dL (ref 0.44–1.00)
GFR calc Af Amer: >60 mL/min (ref >60)
GFR calc non Af Amer: >60 mL/min (ref >60)
GLUCOSE: 123 mg/dL — AB (ref 65–99)
Potassium: 4.4 mmol/L (ref 3.5–5.1)
SODIUM: 138 mmol/L (ref 135–145)
Total Bilirubin: 0.5 mg/dL (ref 0.3–1.2)
Total Protein: 7.9 g/dL (ref 6.5–8.1)

## 2016-06-23 LAB — TYPE AND SCREEN
ABO/RH(D): A POS
Antibody Screen: NEGATIVE

## 2016-06-23 MED ORDER — IOPAMIDOL (ISOVUE-300) INJECTION 61%
30.0000 mL | Freq: Once | INTRAVENOUS | Status: AC | PRN
  Administered 2016-06-23: 30 mL via ORAL

## 2016-06-23 MED ORDER — IOPAMIDOL (ISOVUE-300) INJECTION 61%
100.0000 mL | Freq: Once | INTRAVENOUS | Status: AC | PRN
  Administered 2016-06-23: 100 mL via INTRAVENOUS

## 2016-06-23 NOTE — ED Notes (Signed)
Patient transported to CT 

## 2016-06-23 NOTE — ED Notes (Signed)
Pt states she does not need to have a BM at this time.

## 2016-06-23 NOTE — ED Provider Notes (Addendum)
[facility] Emergency Department Provider Note  ____________________________________________   I have reviewed the triage vital signs and the nursing notes.   HISTORY  Chief Complaint Rectal Bleeding    HPI Annette [name] is a 60 y.o. female with a history of chronic knee pain, anxiety, thyroid issues, who is not on any blood thinners, she states that she woke up this morning at 2:30 with a significant left lower quadrant pain and cramping, had a bowel movement that was diarrhea and then went back to bed. She has had multiple different diarrheal stools since that time but no significant recurrence of her pain. She called her primary care doctor who told her she needed to come immediately here for a CT scan, at least, this is what the patient understood from the conversation. She presents therefore no status soon and anxiety worried about having a stat CT. The patient has no history of GI bleeding in the past, she does take Excedrin sometimes. She's had no hematemesis or emesis or nausea. She denies any ongoing abdominal discomfort. She denies history of diverticular disease. She's never had a colonoscopy. She does have a recent urinary tract infection for which she took Bactrim but that was approximately week and a half ago.      Past Medical History:  Diagnosis Date  . Anxiety   . Arthritis    hands, knees  . Headache    migraines - precipitated by extended use of celebrex/meloxicam  . Hyperlipidemia   . Hypothyroidism 9/10   s/p ablation  . Psoriasis     There are no active problems to display for this patient.   Past Surgical History:  Procedure Laterality Date  . ABDOMINAL HYSTERECTOMY  8/08  . CESAREAN SECTION     x2  . KNEE ARTHROSCOPY Left 09/14/2015   Procedure: ARTHROSCOPY KNEE;  Surgeon: Leanor Kail, MD;  Location: [place];  Service: Orthopedics;  Laterality: Left;  . KNEE ARTHROSCOPY W/ PARTIAL MEDIAL MENISCECTOMY Right  05/12/14  . THYROID SURGERY  2010   ablation    Prior to Admission medications   Medication Sig Start Date End Date Taking? Authorizing Provider  ALPRAZolam (XANAX) 0.25 MG tablet Take 0.25 mg by mouth daily as needed for anxiety (1/2 to 1 tab. (usually uses at bedtime)).   Yes Historical Provider, MD  azelastine (ASTELIN) 0.1 % nasal spray Place into both nostrils as needed for rhinitis. Use in each nostril as directed   Yes Historical Provider, MD  calcium carbonate (OS-CAL) 600 MG tablet Take 600 mg by mouth daily.   Yes Historical Provider, MD  citalopram (CELEXA) 20 MG tablet Take 20 mg by mouth daily. am   Yes Historical Provider, MD  levothyroxine (SYNTHROID, LEVOTHROID) 50 MCG tablet Take 50 mcg by mouth daily before breakfast.   Yes Historical Provider, MD  NORCO 5-325 MG tablet Take 1-2 tablets by mouth every 6 (six) hours as needed for moderate pain. MAXIMUM TOTAL ACETAMINOPHEN DOSE IS 4000 MG PER DAY 09/14/15  Yes Leanor Kail, MD  Omega 3 1000 MG CAPS Take by mouth daily.   Yes Historical Provider, MD  traMADol (ULTRAM) 50 MG tablet Take 50 mg by mouth every 6 (six) hours as needed.   Yes Historical Provider, MD    Allergies Celebrex [celecoxib]; Clindamycin/lincomycin; and Penicillins  Family History  Problem Relation Age of Onset  . Breast cancer Paternal Grandmother 68  . Breast cancer Maternal Grandmother 68  . Breast cancer Sister 41  Social History Social History  Substance Use Topics  . Smoking status: Former Smoker    Quit date: 06/25/1990  . Smokeless tobacco: Never Used  . Alcohol use Yes     Comment: rare - 1 drink/month    Review of Systems Constitutional: No fever/chills Eyes: No visual changes. ENT: No sore throat. No stiff neck no neck pain Cardiovascular: Denies chest pain. Respiratory: Denies shortness of breath. Gastrointestinal:   no vomiting.  See history of present illness Genitourinary: Negative for dysuria. Musculoskeletal: Negative  lower extremity swelling Skin: Negative for rash. Neurological: Negative for severe headaches, focal weakness or numbness. 10-point ROS otherwise negative.  ____________________________________________   PHYSICAL EXAM:  VITAL SIGNS: ED Triage Vitals  Enc Vitals Group     BP 06/23/16 1036 (!) 176/67     Pulse Rate 06/23/16 1036 85     Resp 06/23/16 1036 18     Temp 06/23/16 1036 97.8 F (36.6 C)     Temp Source 06/23/16 1036 Oral     SpO2 06/23/16 1036 98 %     Weight 06/23/16 1038 156 lb (70.8 kg)     Height 06/23/16 1038 5\' 4"  (1.626 m)     Head Circumference --      Peak Flow --      Pain Score 06/23/16 1202 0     Pain Loc --      Pain Edu? --      Excl. in [place]? --     Constitutional: Alert and oriented. Well appearing and in no acute distress. Eyes: Conjunctivae are normal. PERRL. EOMI. Head: Atraumatic. Nose: No congestion/rhinnorhea. Mouth/Throat: Mucous membranes are moist.  Oropharynx non-erythematous. Neck: No stridor.   Nontender with no meningismus Cardiovascular: Normal rate, regular rhythm. Grossly normal heart sounds.  Good peripheral circulation. Respiratory: Normal respiratory effort.  No retractions. Lungs CTAB. Abdominal: Soft and nontender. No distention. No guarding no rebound Back:  There is no focal tenderness or step off.  there is no midline tenderness there are no lesions noted. there is no CVA tenderness Musculoskeletal: No lower extremity tenderness, no upper extremity tenderness. No joint effusions, no DVT signs strong distal pulses no edema Neurologic:  Normal speech and language. No gross focal neurologic deficits are appreciated.  Skin:  Skin is warm, dry and intact. No rash noted. Psychiatric: Mood and affect are quite anxious. Speech and behavior are normal.  ____________________________________________   LABS (all labs ordered are listed, but only abnormal results are displayed)  Labs Reviewed  COMPREHENSIVE METABOLIC PANEL -  Abnormal; Notable for the following:       Result Value   Glucose, Bld 123 (*)    All other components within normal limits  CBC - Abnormal; Notable for the following:    WBC 13.6 (*)    All other components within normal limits  C DIFFICILE QUICK SCREEN W PCR REFLEX  GASTROINTESTINAL PANEL BY PCR, STOOL (REPLACES STOOL CULTURE)  POC OCCULT BLOOD, ED  TYPE AND SCREEN   ____________________________________________  EKG  I personally interpreted any EKGs ordered by me or triage  ____________________________________________  RADIOLOGY  I reviewed any imaging ordered by me or triage that were performed during my shift and, if possible, patient and/or family made aware of any abnormal findings. ____________________________________________   PROCEDURES  Procedure(s) performed: None  Procedures  Critical Care performed: None  ____________________________________________   INITIAL IMPRESSION / ASSESSMENT AND PLAN / ED COURSE  Pertinent labs & imaging results that were available during my care  of the patient were reviewed by me and considered in my medical decision making (see chart for details).  Patient with left lower quadrant discomfort and diarrhea. Abdomen this time is benign. Concern for diverticular other pathologies exist. Also concern for infectious diarrhea which seems to be more likely. Patient has had some scant bright red blood per rectum however her hemoglobin is reassuring blood pressure is certainly not low and now that she is starting to calm down her blood pressures actually come down to more reassuring level. We will obtain CT scan although have low suspicion of surgical pathology, and we will see if she can send a stool sample for [place] to identify a possible enteroinvasive pathogen. fortunately, C. difficile does not usually cause bleeding, although patient has had recent antibiotics we will check that as well  ----------------------------------------- 2:05 PM  on 06/23/2016 -----------------------------------------  Patient remains in no acute distress abdomen benign CT shows only evidence of mild colitis, she has not been able to produce a bowel movement here. With a female nurse chaperone present, did do a rectal exam. Patient does have an actively inflamed hemorrhoid although it is not thrombosed and this certainly could be the cause of her rectal bleeding. There is no other seizure or other pathology noted. She is faintly guaiac positive clear secretions from her bottom. Patient therefore has hemorrhoids and diarrhea and minimal bleeding with reassuring blood work and vitals and no evidence of ongoing pathology or bleeding at this time. She had not have discussed further observation and she would prefer to be discharged at this time with close outpatient follow-up. I do not think this is unreasonable. There is no role for antibiotics at this point. Obviously we cannot [place] what the pathology is that is causing this is most likely a foodborne pathogen either viral or bacterial. Is unclear if the bleeding is from the diarrhea or the hemorrhoid because apparently she has had to wipe her bottom many times and there is some slight erythema down there. In any event, antibiotics certainly are not indicated. Extensive return precautions and follow-up has been given and understood and patient is very comfortable with this plan requesting discharge.  Clinical Course   ____________________________________________   FINAL CLINICAL IMPRESSION(S) / ED DIAGNOSES  Final diagnoses:  None      This chart was dictated using voice recognition software.  Despite best efforts to proofread,  errors can occur which can change meaning.      Schuyler Amor, MD 06/23/16 1256    Schuyler Amor, MD 06/23/16 [phone]

## 2016-06-23 NOTE — ED Triage Notes (Signed)
Woke up at 0230 this morning with LLQ pain/ cramping.  Moved bowels and saw some red blood in stool.  Has had rectal bleeding hourly since then.    Patient reports a recent UTI and treated with bactrim.

## 2016-06-26 ENCOUNTER — Telehealth: Payer: Self-pay

## 2016-06-26 ENCOUNTER — Other Ambulatory Visit: Payer: Self-pay

## 2016-06-26 DIAGNOSIS — Z1211 Encounter for screening for malignant neoplasm of colon: Secondary | ICD-10-CM

## 2016-06-26 NOTE — Telephone Encounter (Signed)
Gastroenterology Pre-Procedure Review  Request Date: 07/24/16 Requesting Physician: Dr. Allen Norris  PATIENT REVIEW QUESTIONS: The patient responded to the following health history questions as indicated:    1. Are you having any GI issues? Yes, Seen in ER for Bright Red Bleeding on 06/23/16- No bleeding since 2. Do you have a personal history of Polyps? No 3. Do you have a family history of Colon Cancer or Polyps? no 4. Diabetes Mellitus? no 5. Joint replacements in the past 12 months?no 6. Major health problems in the past 3 months?no 7. Any artificial heart valves, MVP, or defibrillator?no    MEDICATIONS & ALLERGIES:    Patient reports the following regarding taking any anticoagulation/antiplatelet therapy:   Plavix, Coumadin, Eliquis, Xarelto, Lovenox, Pradaxa, Brilinta, or Effient? no Aspirin? no  Patient confirms/reports the following medications:  Current Outpatient Prescriptions  Medication Sig Dispense Refill  . ALPRAZolam (XANAX) 0.25 MG tablet Take 0.25 mg by mouth daily as needed for anxiety (1/2 to 1 tab. (usually uses at bedtime)).    Marland Kitchen azelastine (ASTELIN) 0.1 % nasal spray Place into both nostrils as needed for rhinitis. Use in each nostril as directed    . calcium carbonate (OS-CAL) 600 MG tablet Take 600 mg by mouth daily.    . citalopram (CELEXA) 20 MG tablet Take 20 mg by mouth daily. am    . levothyroxine (SYNTHROID, LEVOTHROID) 50 MCG tablet Take 50 mcg by mouth daily before breakfast.    . NORCO 5-325 MG tablet Take 1-2 tablets by mouth every 6 (six) hours as needed for moderate pain. MAXIMUM TOTAL ACETAMINOPHEN DOSE IS 4000 MG PER DAY 25 tablet 0  . Omega 3 1000 MG CAPS Take by mouth daily.    . traMADol (ULTRAM) 50 MG tablet Take 50 mg by mouth every 6 (six) hours as needed.     No current facility-administered medications for this visit.     Patient confirms/reports the following allergies:  Allergies  Allergen Reactions  . Celebrex [Celecoxib] Other (See  Comments)    Precipitates migraines with extended use (meloxicam also)  . Clindamycin/Lincomycin Nausea And Vomiting  . Penicillins Swelling    And rash    No orders of the defined types were placed in this encounter.   AUTHORIZATION INFORMATION Primary Insurance: 1D#: Group #:  Secondary Insurance: 1D#: Group #:  SCHEDULE INFORMATION: Date: 07/24/16  Time: Location: [place]

## 2016-07-15 ENCOUNTER — Telehealth: Payer: Self-pay | Admitting: Gastroenterology

## 2016-07-15 NOTE — Telephone Encounter (Signed)
Patient needs to reschedule her colonoscopy °

## 2016-07-16 NOTE — Telephone Encounter (Signed)
Rescheduled to January 11th

## 2016-08-12 DIAGNOSIS — M1711 Unilateral primary osteoarthritis, right knee: Secondary | ICD-10-CM | POA: Insufficient documentation

## 2016-09-01 ENCOUNTER — Telehealth: Payer: Self-pay | Admitting: Gastroenterology

## 2016-09-01 NOTE — Telephone Encounter (Signed)
Patient would like to reschedule her colonoscopy to a Thursday in [place].

## 2016-09-02 ENCOUNTER — Other Ambulatory Visit: Payer: Self-pay | Admitting: Internal Medicine

## 2016-09-02 DIAGNOSIS — Z1231 Encounter for screening mammogram for malignant neoplasm of breast: Secondary | ICD-10-CM

## 2016-09-03 NOTE — Telephone Encounter (Signed)
Pt has rescheduled Colonoscopy to 10/17/16.

## 2016-09-04 NOTE — Telephone Encounter (Signed)
Correction with date. Suppose to be 10/16/16. Thursday as requested by pt. Left vm with [place] to change.

## 2016-09-26 ENCOUNTER — Ambulatory Visit: Payer: PRIVATE HEALTH INSURANCE

## 2016-09-29 ENCOUNTER — Other Ambulatory Visit: Payer: Self-pay | Admitting: Internal Medicine

## 2016-09-29 ENCOUNTER — Ambulatory Visit
Admission: RE | Admit: 2016-09-29 | Discharge: 2016-09-29 | Disposition: A | Discharge status: Home / Self Care | Payer: PRIVATE HEALTH INSURANCE | Source: Ambulatory Visit | Attending: Internal Medicine | Admitting: Internal Medicine

## 2016-09-29 DIAGNOSIS — Z1231 Encounter for screening mammogram for malignant neoplasm of breast: Secondary | ICD-10-CM | POA: Insufficient documentation

## 2016-10-02 ENCOUNTER — Ambulatory Visit: Payer: PRIVATE HEALTH INSURANCE

## 2016-10-16 NOTE — Discharge Instructions (Signed)

## 2016-10-20 ENCOUNTER — Ambulatory Visit
Admission: RE | Admit: 2016-10-20 | Discharge: 2016-10-20 | Disposition: A | Discharge status: Skilled Nursing Facility | Payer: PRIVATE HEALTH INSURANCE | Source: Ambulatory Visit | Attending: Gastroenterology | Admitting: Gastroenterology

## 2016-10-20 ENCOUNTER — Ambulatory Visit: Payer: PRIVATE HEALTH INSURANCE | Admitting: Student in an Organized Health Care Education/Training Program

## 2016-10-20 ENCOUNTER — Encounter
Admission: RE | Disposition: A | Discharge status: Skilled Nursing Facility | Payer: Self-pay | Source: Ambulatory Visit | Attending: Gastroenterology

## 2016-10-20 DIAGNOSIS — Z79899 Other long term (current) drug therapy: Secondary | ICD-10-CM | POA: Diagnosis not present

## 2016-10-20 DIAGNOSIS — F419 Anxiety disorder, unspecified: Secondary | ICD-10-CM | POA: Insufficient documentation

## 2016-10-20 DIAGNOSIS — Z87891 Personal history of nicotine dependence: Secondary | ICD-10-CM | POA: Insufficient documentation

## 2016-10-20 DIAGNOSIS — Z1211 Encounter for screening for malignant neoplasm of colon: Secondary | ICD-10-CM | POA: Diagnosis present

## 2016-10-20 DIAGNOSIS — E039 Hypothyroidism, unspecified: Secondary | ICD-10-CM | POA: Diagnosis not present

## 2016-10-20 DIAGNOSIS — D124 Benign neoplasm of descending colon: Secondary | ICD-10-CM

## 2016-10-20 DIAGNOSIS — K641 Second degree hemorrhoids: Secondary | ICD-10-CM | POA: Diagnosis not present

## 2016-10-20 DIAGNOSIS — M1990 Unspecified osteoarthritis, unspecified site: Secondary | ICD-10-CM | POA: Diagnosis not present

## 2016-10-20 DIAGNOSIS — D122 Benign neoplasm of ascending colon: Secondary | ICD-10-CM | POA: Diagnosis not present

## 2016-10-20 DIAGNOSIS — E0500 Thyrotoxicosis with diffuse goiter without thyrotoxic crisis or storm: Secondary | ICD-10-CM | POA: Insufficient documentation

## 2016-10-20 DIAGNOSIS — E785 Hyperlipidemia, unspecified: Secondary | ICD-10-CM | POA: Insufficient documentation

## 2016-10-20 HISTORY — DX: Myoneural disorder, unspecified: G70.9

## 2016-10-20 HISTORY — PX: POLYPECTOMY: SHX5525

## 2016-10-20 HISTORY — DX: Family history of other specified conditions: Z84.89

## 2016-10-20 HISTORY — PX: COLONOSCOPY WITH PROPOFOL: SHX5780

## 2016-10-20 SURGERY — COLONOSCOPY WITH PROPOFOL
Anesthesia: Monitor Anesthesia Care | Wound class: Contaminated

## 2016-10-20 MED ORDER — PROPOFOL 10 MG/ML IV BOLUS
INTRAVENOUS | Status: DC | PRN
  Administered 2016-10-20: 80 mg via INTRAVENOUS
  Administered 2016-10-20 (×2): 30 mg via INTRAVENOUS
  Administered 2016-10-20 (×4): 20 mg via INTRAVENOUS

## 2016-10-20 MED ORDER — LIDOCAINE HCL (CARDIAC) 20 MG/ML IV SOLN
INTRAVENOUS | Status: DC | PRN
  Administered 2016-10-20: 40 mg via INTRAVENOUS

## 2016-10-20 MED ORDER — STERILE WATER FOR IRRIGATION IR SOLN
Status: DC | PRN
  Administered 2016-10-20: 11:00:00

## 2016-10-20 MED ORDER — LACTATED RINGERS IV SOLN
INTRAVENOUS | Status: DC
  Administered 2016-10-20: 11:00:00 via INTRAVENOUS

## 2016-10-20 SURGICAL SUPPLY — 23 items
CANISTER SUCT 1200ML W/VALVE (MISCELLANEOUS) ×3 IMPLANT
CLIP HMST 235XBRD CATH ROT (MISCELLANEOUS) IMPLANT
CLIP RESOLUTION 360 11X235 (MISCELLANEOUS)
FCP ESCP3.2XJMB 240X2.8X (MISCELLANEOUS)
FORCEPS BIOP RAD 4 LRG CAP 4 (CUTTING FORCEPS) IMPLANT
FORCEPS BIOP RJ4 240 W/NDL (MISCELLANEOUS)
FORCEPS ESCP3.2XJMB 240X2.8X (MISCELLANEOUS) IMPLANT
GOWN CVR UNV OPN BCK APRN NK (MISCELLANEOUS) ×4 IMPLANT
GOWN ISOL THUMB LOOP REG UNIV (MISCELLANEOUS) ×6
INJECTOR VARIJECT VIN23 (MISCELLANEOUS) IMPLANT
KIT DEFENDO VALVE AND CONN (KITS) IMPLANT
KIT ENDO PROCEDURE OLY (KITS) ×3 IMPLANT
MARKER SPOT ENDO TATTOO 5ML (MISCELLANEOUS) IMPLANT
PAD GROUND ADULT SPLIT (MISCELLANEOUS) IMPLANT
PROBE APC STR FIRE (PROBE) IMPLANT
RETRIEVER NET ROTH 2.5X230 LF (MISCELLANEOUS) IMPLANT
SNARE SHORT THROW 13M SML OVAL (MISCELLANEOUS) ×1 IMPLANT
SNARE SHORT THROW 30M LRG OVAL (MISCELLANEOUS) IMPLANT
SNARE SNG USE RND 15MM (INSTRUMENTS) IMPLANT
SPOT EX ENDOSCOPIC TATTOO (MISCELLANEOUS)
TRAP ETRAP POLY (MISCELLANEOUS) ×1 IMPLANT
VARIJECT INJECTOR VIN23 (MISCELLANEOUS)
WATER STERILE IRR 250ML POUR (IV SOLUTION) ×3 IMPLANT

## 2016-10-20 NOTE — Anesthesia Postprocedure Evaluation (Signed)
Anesthesia Post Note  Patient: Annette [name]  Procedure(s) Performed: Procedure(s) (LRB): COLONOSCOPY WITH PROPOFOL (N/A) POLYPECTOMY  Patient location during evaluation: PACU Anesthesia Type: MAC Level of consciousness: awake and alert Pain management: pain level controlled Vital Signs Assessment: post-procedure vital signs reviewed and stable Respiratory status: spontaneous breathing, nonlabored ventilation, respiratory function stable and patient connected to nasal cannula oxygen Cardiovascular status: stable and blood pressure returned to baseline Anesthetic complications: no    Trecia Rogers

## 2016-10-20 NOTE — Transfer of Care (Signed)
Immediate Anesthesia Transfer of Care Note  Patient: TRESIA REVOLORIO  Procedure(s) Performed: Procedure(s): COLONOSCOPY WITH PROPOFOL (N/A) POLYPECTOMY  Patient Location: PACU  Anesthesia Type: MAC  Level of Consciousness: awake, alert  and patient cooperative  Airway and Oxygen Therapy: Patient Spontanous Breathing and Patient connected to supplemental oxygen  Post-op Assessment: Post-op Vital signs reviewed, Patient's Cardiovascular Status Stable, Respiratory Function Stable, Patent Airway and No signs of Nausea or vomiting  Post-op Vital Signs: Reviewed and stable  Complications: No apparent anesthesia complications

## 2016-10-20 NOTE — Op Note (Signed)
[facility] Patient Name: Annette [name] Procedure Date: 10/20/2016 11:14 AM MRN: [id number] Account #: [id number] Date of Birth: 11/28/1955 Admit Type: Outpatient Age: 61 Room: [facility] OR ROOM 01 Gender: Female Note Status: Finalized Procedure:            Colonoscopy Indications:          Screening for colorectal malignant neoplasm Providers:            Lucilla Lame MD, MD Referring MD:         Ramonita Lab, MD (Referring MD) Medicines:            Propofol per Anesthesia Complications:        No immediate complications. Procedure:            Pre-Anesthesia Assessment:                       - Prior to the procedure, a History and Physical was                        performed, and patient medications and allergies were                        reviewed. The patient's tolerance of previous                        anesthesia was also reviewed. The risks and benefits of                        the procedure and the sedation options and risks were                        discussed with the patient. All questions were                        answered, and informed consent was obtained. Prior                        Anticoagulants: The patient has taken no previous                        anticoagulant or antiplatelet agents. ASA Grade                        Assessment: II - A patient with mild systemic disease.                        After reviewing the risks and benefits, the patient was                        deemed in satisfactory condition to undergo the                        procedure.                       After obtaining informed consent, the colonoscope was                        passed under direct vision. Throughout the procedure,  the patient's blood pressure, pulse, and oxygen                        saturations were monitored continuously. The [place] (S#: [id number]) was introduced through                         the anus and advanced to the the cecum, identified by                        appendiceal orifice and ileocecal valve. The                        colonoscopy was performed without difficulty. The                        patient tolerated the procedure well. The quality of                        the bowel preparation was excellent. Findings:      The perianal and digital rectal examinations were normal.      A 4 mm polyp was found in the ascending colon. The polyp was sessile.       The polyp was removed with a cold snare. Resection and retrieval were       complete.      A 4 mm polyp was found in the descending colon. The polyp was sessile.       The polyp was removed with a cold snare. Resection and retrieval were       complete.      Non-bleeding internal hemorrhoids were found during retroflexion. The       hemorrhoids were Grade II (internal hemorrhoids that prolapse but reduce       spontaneously). Impression:           - One 4 mm polyp in the ascending colon, removed with a                        cold snare. Resected and retrieved.                       - One 4 mm polyp in the descending colon, removed with                        a cold snare. Resected and retrieved.                       - Non-bleeding internal hemorrhoids. Recommendation:       - Discharge patient to home.                       - Resume previous diet.                       - Continue present medications.                       - Await pathology results.                       -  Repeat colonoscopy in 5 years if polyp adenoma and 10                        years if hyperplastic Procedure Code(s):    --- Professional ---                       [phone], Colonoscopy, flexible; with removal of tumor(s),                        polyp(s), or other lesion(s) by snare technique Diagnosis Code(s):    --- Professional ---                       Z12.11, Encounter for screening for malignant neoplasm                         of colon                       D12.4, Benign neoplasm of descending colon                       D12.2, Benign neoplasm of ascending colon CPT copyright 2016 American Medical Association. All rights reserved. The codes documented in this report are preliminary and upon coder review may  be revised to meet current compliance requirements. Lucilla Lame MD, MD 10/20/2016 11:40:49 AM This report has been signed electronically. Number of Addenda: 0 Note Initiated On: 10/20/2016 11:14 AM Scope Withdrawal Time: 0 hours 7 minutes 24 seconds  Total Procedure Duration: 0 hours 9 minutes 36 seconds       [facility]

## 2016-10-20 NOTE — H&P (Signed)
Lucilla Lame, MD [facility] [address] [place], [place] [address] Phone: [phone] Fax : [phone]  Primary Care Physician:  Adin Hector, MD Primary Gastroenterologist:  Dr. Allen Norris  Pre-Procedure History & Physical: HPI:  MAGDA BOLENBAUGH is a 61 y.o. female is here for a screening colonoscopy.   Past Medical History:  Diagnosis Date  . Anxiety   . Arthritis    FINGERS, knees  . Family history of adverse reaction to anesthesia    SISTERS BOTH GET NAUSEATED  . Headache    migraines - precipitated by extended use of celebrex/meloxicam  . Hyperlipidemia   . Hypothyroidism 9/10   s/p ablation/ GRAVES DISEASE  . 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 Psoriasis     Past Surgical History:  Procedure Laterality Date  . ABDOMINAL HYSTERECTOMY  8/08  . CESAREAN SECTION     x2  . KNEE ARTHROSCOPY Left 09/14/2015   Procedure: ARTHROSCOPY KNEE;  Surgeon: Leanor Kail, MD;  Location: [place];  Service: Orthopedics;  Laterality: Left;  . KNEE ARTHROSCOPY W/ PARTIAL MEDIAL MENISCECTOMY Right 05/12/14  . THYROID SURGERY  2010   ablation    Prior to Admission medications   Medication Sig Start Date End Date Taking? Authorizing Provider  ALPRAZolam (XANAX) 0.25 MG tablet Take 0.25 mg by mouth daily as needed for anxiety (1/2 to 1 tab. (usually uses at bedtime)).   Yes Historical Provider, MD  aspirin-acetaminophen-caffeine (EXCEDRIN MIGRAINE) [phone] MG tablet Take 1 tablet by mouth every 6 (six) hours as needed for headache.   Yes Historical Provider, MD  atorvastatin (LIPITOR) 20 MG tablet Take 20 mg by mouth daily. PM   Yes Historical Provider, MD  citalopram (CELEXA) 20 MG tablet Take 20 mg by mouth daily. am   Yes Historical Provider, MD  levothyroxine (SYNTHROID, LEVOTHROID) 50 MCG tablet Take 50 mcg by mouth daily before breakfast.   Yes Historical Provider, MD  azelastine (ASTELIN) 0.1 % nasal spray Place into both  nostrils as needed for rhinitis. Use in each nostril as directed    Historical Provider, MD  calcium carbonate (OS-CAL) 600 MG tablet Take 600 mg by mouth daily.    Historical Provider, MD  NORCO 5-325 MG tablet Take 1-2 tablets by mouth every 6 (six) hours as needed for moderate pain. MAXIMUM TOTAL ACETAMINOPHEN DOSE IS 4000 MG PER DAY Patient not taking: Reported on 10/07/2016 09/14/15   Leanor Kail, MD  Omega 3 1000 MG CAPS Take by mouth daily.    Historical Provider, MD  traMADol (ULTRAM) 50 MG tablet Take 50 mg by mouth every 6 (six) hours as needed.    Historical Provider, MD    Allergies as of 06/26/2016 - Review Complete 06/23/2016  Allergen Reaction Noted  . Celebrex [celecoxib] Other (See Comments) 09/11/2015  . Clindamycin/lincomycin Nausea And Vomiting 09/11/2015  . Penicillins Swelling 09/11/2015    Family History  Problem Relation Age of Onset  . Breast cancer Paternal Grandmother 32  . Breast cancer Maternal Grandmother 51  . Breast cancer Sister 59    Social History   Social History  . Marital status: Married    Spouse name: N/A  . Number of children: N/A  . Years of education: N/A   Occupational History  . Not on file.   Social History Main Topics  . Smoking status: Former Smoker    Packs/day: 1.00    Years: 12.00    Types: Cigarettes  Quit date: 06/25/1990  . Smokeless tobacco: Never Used  . Alcohol use Yes     Comment: rare - 1 drink/month  . Drug use: Unknown  . Sexual activity: Not on file   Other Topics Concern  . Not on file   Social History Narrative  . No narrative on file    Review of Systems: See HPI, otherwise negative ROS  Physical Exam: BP (!) 158/79   Pulse 70   Temp 98.1 F (36.7 C)   Resp 16   Ht 5' (1.524 m)   Wt 162 lb (73.5 kg)   SpO2 100%   BMI 31.64 kg/m  General:   Alert,  pleasant and cooperative in NAD Head:  Normocephalic and atraumatic. Neck:  Supple; no masses or thyromegaly. Lungs:  Clear throughout  to auscultation.    Heart:  Regular rate and rhythm. Abdomen:  Soft, nontender and nondistended. Normal bowel sounds, without guarding, and without rebound.   Neurologic:  Alert and  oriented x4;  grossly normal neurologically.  Impression/Plan: MILIANA DOZOIS is now here to undergo a screening colonoscopy.  Risks, benefits, and alternatives regarding colonoscopy have been reviewed with the patient.  Questions have been answered.  All parties agreeable.

## 2016-10-20 NOTE — Anesthesia Procedure Notes (Signed)
Procedure Name: MAC Performed by: Mayme Genta Pre-anesthesia Checklist: Patient identified, Emergency Drugs available, Suction available, Timeout performed and Patient being monitored Patient Re-evaluated:Patient Re-evaluated prior to inductionOxygen Delivery Method: Nasal cannula Placement Confirmation: positive ETCO2

## 2016-10-20 NOTE — Anesthesia Preprocedure Evaluation (Signed)
Anesthesia Evaluation  Patient identified by MRN, date of birth, ID band Patient awake    Reviewed: Allergy & Precautions, H&P , NPO status , Patient's Chart, lab work & pertinent test results, reviewed documented beta blocker date and time   Airway Mallampati: II  TM Distance: >3 FB Neck ROM: full    Dental no notable dental hx.    Pulmonary former smoker,    Pulmonary exam normal breath sounds clear to auscultation       Cardiovascular Exercise Tolerance: Good negative cardio ROS   Rhythm:regular Rate:Normal     Neuro/Psych negative neurological ROS  negative psych ROS   GI/Hepatic negative GI ROS, Neg liver ROS,   Endo/Other  negative endocrine ROS  Renal/GU negative Renal ROS  negative genitourinary   Musculoskeletal   Abdominal   Peds  Hematology negative hematology ROS (+)   Anesthesia Other Findings   Reproductive/Obstetrics negative OB ROS                             Anesthesia Physical Anesthesia Plan  ASA: II  Anesthesia Plan: MAC   Post-op Pain Management:    Induction:   Airway Management Planned:   Additional Equipment:   Intra-op Plan:   Post-operative Plan:   Informed Consent: I have reviewed the patients History and Physical, chart, labs and discussed the procedure including the risks, benefits and alternatives for the proposed anesthesia with the patient or authorized representative who has indicated his/her understanding and acceptance.   Dental Advisory Given  Plan Discussed with: CRNA  Anesthesia Plan Comments:         Anesthesia Quick Evaluation

## 2016-10-21 ENCOUNTER — Encounter: Payer: Self-pay | Admitting: Gastroenterology

## 2016-10-22 ENCOUNTER — Encounter: Payer: Self-pay | Admitting: Gastroenterology

## 2016-10-27 ENCOUNTER — Telehealth: Payer: Self-pay | Admitting: Gastroenterology

## 2016-10-27 NOTE — Telephone Encounter (Signed)
Spoke to pt about letter received.

## 2016-10-27 NOTE — Telephone Encounter (Signed)
Patient left a voice message that she received a letter regarding her colonoscopy and has some questions.

## 2017-05-11 ENCOUNTER — Other Ambulatory Visit: Payer: Self-pay | Admitting: Unknown Physician Specialty

## 2017-05-11 DIAGNOSIS — R0989 Other specified symptoms and signs involving the circulatory and respiratory systems: Secondary | ICD-10-CM

## 2017-05-14 ENCOUNTER — Ambulatory Visit
Admission: RE | Admit: 2017-05-14 | Discharge: 2017-05-14 | Disposition: A | Discharge status: Home / Self Care | Payer: PRIVATE HEALTH INSURANCE | Source: Ambulatory Visit | Attending: Unknown Physician Specialty | Admitting: Unknown Physician Specialty

## 2017-05-14 DIAGNOSIS — I6523 Occlusion and stenosis of bilateral carotid arteries: Secondary | ICD-10-CM | POA: Insufficient documentation

## 2017-05-14 DIAGNOSIS — R0989 Other specified symptoms and signs involving the circulatory and respiratory systems: Secondary | ICD-10-CM

## 2017-05-15 ENCOUNTER — Encounter (INDEPENDENT_AMBULATORY_CARE_PROVIDER_SITE_OTHER): Payer: Self-pay | Admitting: Vascular Surgery

## 2017-05-15 ENCOUNTER — Ambulatory Visit (INDEPENDENT_AMBULATORY_CARE_PROVIDER_SITE_OTHER): Payer: PRIVATE HEALTH INSURANCE | Admitting: Vascular Surgery

## 2017-05-15 VITALS — BP 180/83 | HR 78 | Resp 17 | Ht 60.0 in | Wt 171.0 lb

## 2017-05-15 DIAGNOSIS — E785 Hyperlipidemia, unspecified: Secondary | ICD-10-CM | POA: Diagnosis not present

## 2017-05-15 DIAGNOSIS — H93A1 Pulsatile tinnitus, right ear: Secondary | ICD-10-CM | POA: Insufficient documentation

## 2017-05-15 DIAGNOSIS — I6523 Occlusion and stenosis of bilateral carotid arteries: Secondary | ICD-10-CM | POA: Diagnosis not present

## 2017-05-15 DIAGNOSIS — I6529 Occlusion and stenosis of unspecified carotid artery: Secondary | ICD-10-CM | POA: Insufficient documentation

## 2017-05-15 NOTE — Assessment & Plan Note (Signed)
ultrasound that I have reviewed.  This demonstrated a greater than 70% stenosis of the right internal carotid artery by duplex criteria. The left carotid artery had a less than 50% stenosis by duplex criteria.   The patient remains asymptomatic with respect to the carotid stenosis.  However, the patient has now progressed and has a lesion the is >70%.  Patient should undergo CT angiography of the carotid arteries to define the degree of stenosis of the internal carotid arteries bilaterally and the anatomic suitability for surgery vs. intervention.  If the patient does indeed need surgery cardiac clearance will be required, once cleared the patient will be scheduled for surgery.  The risks, benefits and alternative therapies were reviewed in detail with the patient.  All questions were answered.  The patient agrees to proceed with imaging.  Continue antiplatelet therapy as prescribed. Continue management of CAD, HTN and Hyperlipidemia. Healthy heart diet, encouraged exercise at least 4 times per week.

## 2017-05-15 NOTE — Assessment & Plan Note (Signed)
Likely secondary to carotid disease.

## 2017-05-15 NOTE — Progress Notes (Signed)
Patient ID: Annette [name], female   DOB: September 12, 1955, 61 y.o.   MRN: [id number]  Chief Complaint  Patient presents with  . Carotid    ref Tami Ribas 90% right and left is mild    HPI Annette [name] is a 61 y.o. female.  I am asked to see the patient by Dr. Tami Ribas for evaluation of carotid stenosis.  The patient reports pulsatile tinnitus for several months.  She has had occasional dizziness as well for several months.  She has no left pulsatile tinnitus.  She denies focal neurologic symptoms. Specifically, the patient denies amaurosis fugax, speech or swallowing difficulties, or arm or leg weakness or numbness.  She was seen by her ENT surgeon who heard a bruit and ordered an ultrasound that I have reviewed.  This demonstrated a greater than 70% stenosis of the right internal carotid artery by duplex criteria. The left carotid artery had a less than 50% stenosis by duplex criteria. She is then referred for further evaluation and treatment.    Past Medical History:  Diagnosis Date  . Anxiety   . Arthritis    FINGERS, knees  . Family history of adverse reaction to anesthesia    SISTERS BOTH GET NAUSEATED  . Headache    migraines - precipitated by extended use of celebrex/meloxicam  . Hyperlipidemia   . Hypothyroidism 9/10   s/p ablation/ GRAVES DISEASE  . 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 Psoriasis     Past Surgical History:  Procedure Laterality Date  . ABDOMINAL HYSTERECTOMY  8/08  . CESAREAN SECTION     x2  . COLONOSCOPY WITH PROPOFOL N/A 10/20/2016   Procedure: COLONOSCOPY WITH PROPOFOL;  Surgeon: Lucilla Lame, MD;  Location: [facility];  Service: Endoscopy;  Laterality: N/A;  . KNEE ARTHROSCOPY Left 09/14/2015   Procedure: ARTHROSCOPY KNEE;  Surgeon: Leanor Kail, MD;  Location: [facility];  Service: Orthopedics;  Laterality: Left;  . KNEE ARTHROSCOPY W/ PARTIAL MEDIAL MENISCECTOMY Right 05/12/14  . POLYPECTOMY   10/20/2016   Procedure: POLYPECTOMY;  Surgeon: Lucilla Lame, MD;  Location: [facility];  Service: Endoscopy;;  . THYROID SURGERY  2010   ablation    Family History  Problem Relation Age of Onset  . Breast cancer Paternal Grandmother 17  . Breast cancer Maternal Grandmother 19  . Breast cancer Sister 1  No bleeding or clotting disorders  Social History Social History  Substance Use Topics  . Smoking status: Former Smoker    Packs/day: 1.00    Years: 12.00    Types: Cigarettes    Quit date: 06/25/1990  . Smokeless tobacco: Never Used  . Alcohol use Yes     Comment: rare - 1 drink/month  No IVDU  Allergies  Allergen Reactions  . Celebrex [Celecoxib] Other (See Comments)    Precipitates migraines with extended use (meloxicam also)  . Clindamycin/Lincomycin Nausea And Vomiting  . Penicillins Swelling    And rash    Current Outpatient Prescriptions  Medication Sig Dispense Refill  . ALPRAZolam (XANAX) 0.25 MG tablet Take 0.25 mg by mouth daily as needed for anxiety (1/2 to 1 tab. (usually uses at bedtime)).    Marland Kitchen aspirin-acetaminophen-caffeine (EXCEDRIN MIGRAINE) 250-250-65 MG tablet Take 1 tablet by mouth every 6 (six) hours as needed for headache.    Marland Kitchen atorvastatin (LIPITOR) 20 MG tablet Take 20 mg by mouth daily. PM    . calcium carbonate (OS-CAL) 600 MG tablet  Take 600 mg by mouth daily.    . citalopram (CELEXA) 20 MG tablet Take 20 mg by mouth daily. am    . levothyroxine (SYNTHROID, LEVOTHROID) 50 MCG tablet Take 50 mcg by mouth daily before breakfast.    . azelastine (ASTELIN) 0.1 % nasal spray Place into both nostrils as needed for rhinitis. Use in each nostril as directed    . NORCO 5-325 MG tablet Take 1-2 tablets by mouth every 6 (six) hours as needed for moderate pain. MAXIMUM TOTAL ACETAMINOPHEN DOSE IS 4000 MG PER DAY (Patient not taking: Reported on 10/07/2016) 25 tablet 0  . Omega 3 1000 MG CAPS Take by mouth daily.    . traMADol (ULTRAM) 50 MG tablet Take  50 mg by mouth every 6 (six) hours as needed.     No current facility-administered medications for this visit.       REVIEW OF SYSTEMS (Negative unless checked)  Constitutional: [] Weight loss  [] Fever  [] Chills Cardiac: [] Chest pain   [] Chest pressure   [] Palpitations   [] Shortness of breath when laying flat   [] Shortness of breath at rest   [] Shortness of breath with exertion. Vascular:  [] Pain in legs with walking   [] Pain in legs at rest   [] Pain in legs when laying flat   [] Claudication   [] Pain in feet when walking  [] Pain in feet at rest  [] Pain in feet when laying flat   [] History of DVT   [] Phlebitis   [] Swelling in legs   [] Varicose veins   [] Non-healing ulcers Pulmonary:   [] Uses home oxygen   [] Productive cough   [] Hemoptysis   [] Wheeze  [] COPD   [] Asthma Neurologic:  [x] Dizziness  [] Blackouts   [] Seizures   [] History of stroke   [] History of TIA  [] Aphasia   [] Temporary blindness   [] Dysphagia   [] Weakness or numbness in arms   [] Weakness or numbness in legs Musculoskeletal:  [] Arthritis   [] Joint swelling   [] Joint pain   [] Low back pain Hematologic:  [] Easy bruising  [] Easy bleeding   [] Hypercoagulable state   [] Anemic  [] Hepatitis Gastrointestinal:  [] Blood in stool   [] Vomiting blood  [] Gastroesophageal reflux/heartburn   [] Abdominal pain Genitourinary:  [] Chronic kidney disease   [] Difficult urination  [] Frequent urination  [] Burning with urination   [] Hematuria Skin:  [] Rashes   [] Ulcers   [] Wounds Psychological:  [x] History of anxiety   []  History of major depression.    Physical Exam BP (!) 180/83 (BP Location: Left Arm)   Pulse 78   Resp 17   Ht 5' (1.524 m)   Wt 171 lb (77.6 kg)   BMI 33.40 kg/m  Gen:  WD/WN, NAD Head: [place]/AT, No temporalis wasting.  Ear/Nose/Throat: Hearing grossly intact, nares w/o erythema or drainage, oropharynx w/o Erythema/Exudate Eyes: Conjunctiva clear, sclera non-icteric  Neck: trachea midline.  Soft right carotid bruit. No left  carotid bruit  Pulmonary:  Good air movement, clear to auscultation bilaterally.  Cardiac: RRR, normal S1, S2, no Murmurs, rubs or gallops. Vascular:  Vessel Right Left  Radial Palpable Palpable                          PT Palpable Palpable  DP Palpable Palpable   Gastrointestinal: soft, non-tender/non-distended.  Musculoskeletal: M/S 5/5 throughout.  Extremities without ischemic changes.  No deformity or atrophy. No lower extremity edema. Neurologic: Sensation grossly intact in extremities.  Symmetrical.  Speech is fluent. Motor exam as listed above. Psychiatric: Judgment  intact, Mood & affect appropriate for pt's clinical situation. Dermatologic: No rashes or ulcers noted.  No cellulitis or open wounds.   Data Reviewed [place] as below  Radiology US Carotid Bilateral  Result Date: 05/14/2017 CLINICAL DATA:  Carotid bruit. EXAM: BILATERAL CAROTID DUPLEX ULTRASOUND TECHNIQUE: Pearline Cables scale imaging, color Doppler and duplex ultrasound were performed of bilateral carotid and vertebral arteries in the neck. COMPARISON:  No recent prior. FINDINGS: Criteria: Quantification of carotid stenosis is based on velocity parameters that correlate the residual internal carotid diameter with NASCET-based stenosis levels, using the diameter of the distal internal carotid lumen as the denominator for stenosis measurement. The following velocity measurements were obtained: RIGHT ICA:  347/50 cm/sec CCA:  387/56 cm/sec SYSTOLIC ICA/CCA RATIO:  3.4 DIASTOLIC ICA/CCA RATIO:  1.8 ECA:  174 cm/sec LEFT ICA:  172/35 cm/sec CCA:  150/20 set cm/sec SYSTOLIC ICA/CCA RATIO:  1.2 DIASTOLIC ICA/CCA RATIO:  1.3 ECA:  331 cm/sec RIGHT CAROTID ARTERY: Mild right common carotid atherosclerotic vascular disease Moderate to severe right carotid bifurcation stenosis is noted visually. Stenosis may be severe given the elevated flow velocity and velocity ratios. Stenosis may be greater than 90%. Carotid CTA suggested to further  evaluate. RIGHT VERTEBRAL ARTERY:  Patent antegrade flow. LEFT CAROTID ARTERY: Mild left common carotid and carotid bifurcation atherosclerotic vascular disease . LEFT VERTEBRAL ARTERY:  Patent with antegrade flow. IMPRESSION: 1. Moderate to severe right carotid bifurcation atherosclerotic vascular disease noted visually . However flow velocities and velocity ratios are significantly elevated on the right and stenosis may be greater than 90%. CTA should be considered to further evaluate. 2.  Mild left carotid vascular disease. 3.  Vertebrals are patent with antegrade flow bilaterally. Electronically Signed   By: Marcello Moores  Register   On: 05/14/2017 16:19    Labs No results found for this or any previous visit (from the past 2160 hour(s)).  Assessment/Plan:  Hyperlipidemia, unspecified lipid control important in reducing the progression of atherosclerotic disease. 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  Carotid artery stenosis ultrasound that I have reviewed.  This demonstrated a greater than 70% stenosis of the right internal carotid artery by duplex criteria. The left carotid artery had a less than 50% stenosis by duplex criteria.   The patient remains asymptomatic with respect to the carotid stenosis.  However, the patient has now progressed and has a lesion the is >70%.  Patient should undergo CT angiography of the carotid arteries to define the degree of stenosis of the internal carotid arteries bilaterally and the anatomic suitability for surgery vs. intervention.  If the patient does indeed need surgery cardiac clearance will be required, once cleared the patient will be scheduled for surgery.  The risks, benefits and alternative therapies were reviewed in detail with the patient.  All questions were answered.  The patient agrees to proceed with imaging.  Continue antiplatelet therapy as prescribed. Continue management of CAD, HTN  and Hyperlipidemia. Healthy heart diet, encouraged exercise at least 4 times per week.        Leotis Pain 05/15/2017, 10:27 AM   This note was created with Dragon medical transcription system.  Any errors from dictation are unintentional.

## 2017-05-15 NOTE — Patient Instructions (Signed)
Carotid Artery Disease The carotid arteries are arteries on both sides of the neck. They carry blood to the brain. Carotid artery disease is when the arteries get smaller (narrow) or get blocked. If these arteries get smaller or get blocked, you are more likely to have a stroke or warning stroke (transient ischemic attack). Follow these instructions at home:  Take medicines as told by your doctor. Make sure you understand all your medicine instructions. Do not stop your medicines without talking to your doctor first.  Follow your doctor's diet instructions. It is important to eat a healthy diet that includes plenty of: ? Fresh fruits. ? Vegetables. ? Lean meats.  Avoid: ? High-fat foods. ? High-sodium foods. ? Foods that are fried, overly processed, or have poor nutritional value.  Stay a healthy weight.  Stay active. Get at least 30 minutes of activity every day.  Do not smoke.  Limit alcohol use to: ? No more than 2 drinks a day for men. ? No more than 1 drink a day for women who are not pregnant.  Do not use illegal drugs.  Keep all doctor visits as told. Get help right away if:  You have sudden weakness or loss of feeling (numbness) on one side of the body, such as the face, arm, or leg.  You have sudden confusion.  You have trouble speaking (aphasia) or understanding.  You have sudden trouble seeing out of one or both eyes.  You have sudden trouble walking.  You have dizziness or feel like you might pass out (faint).  You have a loss of balance or your movements are not steady (uncoordinated).  You have a sudden, severe headache with no known cause.  You have trouble swallowing (dysphagia). Call your local emergency services (911 in [place] Do notdrive yourself to the clinic or hospital. This information is not intended to replace advice given to you by your health care provider. Make sure you discuss any questions you have with your health care  provider. Document Released: 07/28/2012 Document Revised: 01/17/2016 Document Reviewed: 02/09/2013 Elsevier Interactive Patient Education  2018 [employer]  

## 2017-05-15 NOTE — Assessment & Plan Note (Signed)
lipid control important in reducing the progression of atherosclerotic disease. Continue statin therapy  

## 2017-05-25 DIAGNOSIS — R519 Headache, unspecified: Secondary | ICD-10-CM

## 2017-05-25 HISTORY — DX: Headache, unspecified: R51.9

## 2017-05-26 ENCOUNTER — Ambulatory Visit
Admission: RE | Admit: 2017-05-26 | Discharge: 2017-05-26 | Disposition: A | Discharge status: Home / Self Care | Payer: PRIVATE HEALTH INSURANCE | Source: Ambulatory Visit | Attending: Vascular Surgery | Admitting: Vascular Surgery

## 2017-05-26 DIAGNOSIS — I6523 Occlusion and stenosis of bilateral carotid arteries: Secondary | ICD-10-CM

## 2017-05-26 LAB — POCT I-STAT CREATININE: CREATININE: 0.9 mg/dL (ref 0.44–1.00)

## 2017-05-26 MED ORDER — IOPAMIDOL (ISOVUE-370) INJECTION 76%
75.0000 mL | Freq: Once | INTRAVENOUS | Status: AC | PRN
  Administered 2017-05-26: 75 mL via INTRAVENOUS

## 2017-05-29 ENCOUNTER — Ambulatory Visit (INDEPENDENT_AMBULATORY_CARE_PROVIDER_SITE_OTHER): Payer: PRIVATE HEALTH INSURANCE | Admitting: Vascular Surgery

## 2017-05-29 ENCOUNTER — Encounter (INDEPENDENT_AMBULATORY_CARE_PROVIDER_SITE_OTHER): Payer: Self-pay | Admitting: Vascular Surgery

## 2017-05-29 DIAGNOSIS — H93A1 Pulsatile tinnitus, right ear: Secondary | ICD-10-CM | POA: Diagnosis not present

## 2017-05-29 DIAGNOSIS — I6523 Occlusion and stenosis of bilateral carotid arteries: Secondary | ICD-10-CM | POA: Diagnosis not present

## 2017-05-29 DIAGNOSIS — E785 Hyperlipidemia, unspecified: Secondary | ICD-10-CM

## 2017-05-29 NOTE — Progress Notes (Signed)
MRN : [id number]  Annette [name] is a 61 y.o. (1956/05/05) female who presents with chief complaint of  Chief Complaint  Patient presents with  . Follow-up  .  History of Present Illness: Patient returns today in follow up of carotid disease. Patient reports continued pulsatile tinnitus but no other focal neurologic symptoms since her last visit a couple of weeks ago. She is undergone a CT angiogram which I have independently reviewed. This demonstrates an approximately 80% stenosis of the right internal carotid artery with both calcified and noncalcified plaque. The left carotid artery does have some calcified plaque degree of stenosis is less than 50% on my interpretation.       Past Medical History:  Diagnosis Date  . Anxiety   . Arthritis    FINGERS, knees  . Family history of adverse reaction to anesthesia    SISTERS BOTH GET NAUSEATED  . Headache    migraines - precipitated by extended use of celebrex/meloxicam  . Hyperlipidemia   . Hypothyroidism 9/10   s/p ablation/ GRAVES DISEASE  . 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 Psoriasis          Past Surgical History:  Procedure Laterality Date  . ABDOMINAL HYSTERECTOMY  8/08  . CESAREAN SECTION     x2  . COLONOSCOPY WITH PROPOFOL N/A 10/20/2016   Procedure: COLONOSCOPY WITH PROPOFOL;  Surgeon: Lucilla Lame, MD;  Location: [facility];  Service: Endoscopy;  Laterality: N/A;  . KNEE ARTHROSCOPY Left 09/14/2015   Procedure: ARTHROSCOPY KNEE;  Surgeon: Leanor Kail, MD;  Location: [facility];  Service: Orthopedics;  Laterality: Left;  . KNEE ARTHROSCOPY W/ PARTIAL MEDIAL MENISCECTOMY Right 05/12/14  . POLYPECTOMY  10/20/2016   Procedure: POLYPECTOMY;  Surgeon: Lucilla Lame, MD;  Location: [facility];  Service: Endoscopy;;  . THYROID SURGERY  2010   ablation         Family History  Problem Relation Age of Onset  . Breast cancer  Paternal Grandmother 21  . Breast cancer Maternal Grandmother 76  . Breast cancer Sister 69  No bleeding or clotting disorders  Social History        Social History  Substance Use Topics  . Smoking status: Former Smoker    Packs/day: 1.00    Years: 12.00    Types: Cigarettes    Quit date: 06/25/1990  . Smokeless tobacco: Never Used  . Alcohol use Yes      Comment: rare - 1 drink/month  No IVDU       Allergies  Allergen Reactions  . Celebrex [Celecoxib] Other (See Comments)    Precipitates migraines with extended use (meloxicam also)  . Clindamycin/Lincomycin Nausea And Vomiting  . Penicillins Swelling    And rash          Current Outpatient Prescriptions  Medication Sig Dispense Refill  . ALPRAZolam (XANAX) 0.25 MG tablet Take 0.25 mg by mouth daily as needed for anxiety (1/2 to 1 tab. (usually uses at bedtime)).    Marland Kitchen aspirin-acetaminophen-caffeine (EXCEDRIN MIGRAINE) 250-250-65 MG tablet Take 1 tablet by mouth every 6 (six) hours as needed for headache.    Marland Kitchen atorvastatin (LIPITOR) 20 MG tablet Take 20 mg by mouth daily. PM    . calcium carbonate (OS-CAL) 600 MG tablet Take 600 mg by mouth daily.    . citalopram (CELEXA) 20 MG tablet Take 20 mg by mouth daily. am    . levothyroxine (SYNTHROID, LEVOTHROID)  50 MCG tablet Take 50 mcg by mouth daily before breakfast.    . azelastine (ASTELIN) 0.1 % nasal spray Place into both nostrils as needed for rhinitis. Use in each nostril as directed    . NORCO 5-325 MG tablet Take 1-2 tablets by mouth every 6 (six) hours as needed for moderate pain. MAXIMUM TOTAL ACETAMINOPHEN DOSE IS 4000 MG PER DAY (Patient not taking: Reported on 10/07/2016) 25 tablet 0  . Omega 3 1000 MG CAPS Take by mouth daily.    . traMADol (ULTRAM) 50 MG tablet Take 50 mg by mouth every 6 (six) hours as needed.     No current facility-administered medications for this visit.       REVIEW OF SYSTEMS (Negative unless  checked)  Constitutional: [] Weight loss  [] Fever  [] Chills Cardiac: [] Chest pain   [] Chest pressure   [] Palpitations   [] Shortness of breath when laying flat   [] Shortness of breath at rest   [] Shortness of breath with exertion. Vascular:  [] Pain in legs with walking   [] Pain in legs at rest   [] Pain in legs when laying flat   [] Claudication   [] Pain in feet when walking  [] Pain in feet at rest  [] Pain in feet when laying flat   [] History of DVT   [] Phlebitis   [] Swelling in legs   [] Varicose veins   [] Non-healing ulcers Pulmonary:   [] Uses home oxygen   [] Productive cough   [] Hemoptysis   [] Wheeze  [] COPD   [] Asthma Neurologic:  [x] Dizziness  [] Blackouts   [] Seizures   [] History of stroke   [] History of TIA  [] Aphasia   [] Temporary blindness   [] Dysphagia   [] Weakness or numbness in arms   [] Weakness or numbness in legs Musculoskeletal:  [] Arthritis   [] Joint swelling   [] Joint pain   [] Low back pain Hematologic:  [] Easy bruising  [] Easy bleeding   [] Hypercoagulable state   [] Anemic  [] Hepatitis Gastrointestinal:  [] Blood in stool   [] Vomiting blood  [] Gastroesophageal reflux/heartburn   [] Abdominal pain Genitourinary:  [] Chronic kidney disease   [] Difficult urination  [] Frequent urination  [] Burning with urination   [] Hematuria Skin:  [] Rashes   [] Ulcers   [] Wounds Psychological:  [x] History of anxiety   []  History of major depression.    Physical Examination  There were no vitals taken for this visit. Gen:  WD/WN, NAD Head: [place]/AT, No temporalis wasting. Ear/Nose/Throat: Hearing grossly intact, nares w/o erythema or drainage, trachea midline Eyes: Conjunctiva clear. Sclera non-icteric Neck: Supple.  No JVD.  Pulmonary:  Good air movement, no use of accessory muscles.  Cardiac: RRR, normal S1, S2 Vascular:  Vessel Right Left  Radial Palpable Palpable                          PT Palpable Palpable  DP Palpable Palpable    Musculoskeletal: M/S 5/5 throughout.  No deformity or  atrophy. No edema. Neurologic: Sensation grossly intact in extremities.  Symmetrical.  Speech is fluent.  Psychiatric: Judgment intact, Mood & affect appropriate for pt's clinical situation. Dermatologic: No rashes or ulcers noted.  No cellulitis or open wounds.       Labs Recent Results (from the past 2160 hour(s))  I-STAT creatinine     Status: None   Collection Time: 05/26/17  1:25 PM  Result Value Ref Range   Creatinine, Ser 0.90 0.44 - 1.00 mg/dL    Radiology Ct Angio Neck W/cm &/or Wo/cm  Result Date: 05/26/2017 CLINICAL DATA:  Pulsatile tinnitus, sensation of heartbeat in RIGHT ear for 4-6 weeks. EXAM: CT ANGIOGRAPHY NECK TECHNIQUE: Multidetector CT imaging of the neck was performed using the standard protocol during bolus administration of intravenous contrast. Multiplanar CT image reconstructions and MIPs were obtained to evaluate the vascular anatomy. Carotid stenosis measurements (when applicable) are obtained utilizing NASCET criteria, using the distal internal carotid diameter as the denominator. CONTRAST:  Isovue 370, 75 mL. COMPARISON:  Carotid Dopplers 05/14/2017. FINDINGS: Aortic arch: Standard branching. Imaged portion shows no evidence of aneurysm or dissection. No significant stenosis of the major arch vessel origins. Right carotid system: Significant calcific and soft plaque at the bifurcation, extending into the RIGHT internal carotid artery. Luminal measurements of 1.1/4.6 proximal/ distal correlate with a 75-80% stenosis, flow reducing. No evidence for dissection or significant ulceration. Left carotid system: Calcific and soft plaque at the bifurcation. Luminal measurements of 2.2/4.4 proximal/ distal correlate with a 50% stenosis, non flow reducing. No significant ulceration or dissection. Vertebral arteries: BILATERAL patent, LEFT dominant. No focal stenosis in the neck. Skeleton: Spondylosis.  No worrisome osseous findings. Other neck: No neck masses. No temporal bone  inflammatory process or aberrant ICA. Upper chest: No pneumothorax or mass. Good correlation with prior carotid Doppler exam. IMPRESSION: 75-80% RIGHT ICA stenosis related to both calcific and soft plaque. 50% LEFT ICA stenosis again related to calcific and soft plaque. Electronically Signed   By: Staci Righter M.D.   On: 05/26/2017 14:07   US Carotid Bilateral  Result Date: 05/14/2017 CLINICAL DATA:  Carotid bruit. EXAM: BILATERAL CAROTID DUPLEX ULTRASOUND TECHNIQUE: Pearline Cables scale imaging, color Doppler and duplex ultrasound were performed of bilateral carotid and vertebral arteries in the neck. COMPARISON:  No recent prior. FINDINGS: Criteria: Quantification of carotid stenosis is based on velocity parameters that correlate the residual internal carotid diameter with NASCET-based stenosis levels, using the diameter of the distal internal carotid lumen as the denominator for stenosis measurement. The following velocity measurements were obtained: RIGHT ICA:  347/50 cm/sec CCA:  132/44 cm/sec SYSTOLIC ICA/CCA RATIO:  3.4 DIASTOLIC ICA/CCA RATIO:  1.8 ECA:  174 cm/sec LEFT ICA:  172/35 cm/sec CCA:  150/20 set cm/sec SYSTOLIC ICA/CCA RATIO:  1.2 DIASTOLIC ICA/CCA RATIO:  1.3 ECA:  331 cm/sec RIGHT CAROTID ARTERY: Mild right common carotid atherosclerotic vascular disease Moderate to severe right carotid bifurcation stenosis is noted visually. Stenosis may be severe given the elevated flow velocity and velocity ratios. Stenosis may be greater than 90%. Carotid CTA suggested to further evaluate. RIGHT VERTEBRAL ARTERY:  Patent antegrade flow. LEFT CAROTID ARTERY: Mild left common carotid and carotid bifurcation atherosclerotic vascular disease . LEFT VERTEBRAL ARTERY:  Patent with antegrade flow. IMPRESSION: 1. Moderate to severe right carotid bifurcation atherosclerotic vascular disease noted visually . However flow velocities and velocity ratios are significantly elevated on the right and stenosis may be greater than  90%. CTA should be considered to further evaluate. 2.  Mild left carotid vascular disease. 3.  Vertebrals are patent with antegrade flow bilaterally. Electronically Signed   By: Marcello Moores  Register   On: 05/14/2017 16:19     Assessment/Plan Hyperlipidemia, unspecified lipid control important in reducing the progression of atherosclerotic disease. 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  Carotid artery stenosis This demonstrates an approximately 80% stenosis of the right internal carotid artery with both calcified and noncalcified plaque. The left carotid artery does have some calcified plaque degree of stenosis is less than 50% on my interpretation.  Recommend:  The patient remains asymptomatic with respect to the carotid stenosis.  However, the patient has now progressed and has a lesion the is >75%.  Patient's CT angiography of the carotid arteries confirms >75% right ICA stenosis.  The anatomical considerations support surgery over stenting.  This was discussed in detail with the patient.  The patient does indeed need surgery, therefore, cardiac clearance will be arranged. Once cleared the patient will be scheduled for surgery.  The risks, benefits and alternative therapies were reviewed in detail with the patient.  All questions were answered.  The patient agrees to proceed with surgery of the right carotid artery.  Continue antiplatelet therapy as prescribed. Continue management of CAD, HTN and Hyperlipidemia. Healthy heart diet, encouraged exercise at least 4 times per week.      Leotis Pain, MD  05/29/2017 11:07 AM    This note was created with Dragon medical transcription system.  Any errors from dictation are purely unintentional

## 2017-05-29 NOTE — Assessment & Plan Note (Signed)
This demonstrates an approximately 80% stenosis of the right internal carotid artery with both calcified and noncalcified plaque. The left carotid artery does have some calcified plaque degree of stenosis is less than 50% on my interpretation.  Recommend:  The patient remains asymptomatic with respect to the carotid stenosis.  However, the patient has now progressed and has a lesion the is >75%.  Patient's CT angiography of the carotid arteries confirms >75% right ICA stenosis.  The anatomical considerations support surgery over stenting.  This was discussed in detail with the patient.  The patient does indeed need surgery, therefore, cardiac clearance will be arranged. Once cleared the patient will be scheduled for surgery.  The risks, benefits and alternative therapies were reviewed in detail with the patient.  All questions were answered.  The patient agrees to proceed with surgery of the right carotid artery.  Continue antiplatelet therapy as prescribed. Continue management of CAD, HTN and Hyperlipidemia. Healthy heart diet, encouraged exercise at least 4 times per week.

## 2017-05-29 NOTE — Patient Instructions (Signed)
Carotid Endarterectomy A carotid endarterectomy is a surgery to remove a blockage in the carotid arteries. The carotid arteries are the large blood vessels on both sides of the neck that supply blood to the brain. Carotid artery disease, also called carotid artery stenosis, is the narrowing or blockage of one or both carotid arteries. Carotid artery disease is usually caused by atherosclerosis, which is a buildup of fat and plaques in the arteries. Some buildup of plaques normally occurs with aging. The plaques may partially or totally block blood flow or cause a clot to form in the carotid arteries. This may cause a stroke. Tell a health care provider about:  Any allergies you have.  All medicines you are taking, including vitamins, herbs, eye drops, creams, and over-the-counter medicines.  Any problems you or family members have had with anesthetic medicines.  Any blood disorders you have.  Any surgeries you have had.  Any medical conditions you have, including diabetes, kidney problems, and infections.  Whether you are pregnant or may be pregnant. What are the risks? Generally, this is a safe procedure. However, problems may occur, including:  Infection.  Bleeding.  Blood clots.  Allergic reactions to medicines.  Damage to nerves near the carotid arteries. This can cause a hoarse voice or weakness of muscles your face.  Stroke.  Seizures.  Heart attack (myocardial infarction).  Narrowing of the opened blood vessel (re-stenosis) . This may require another surgery.  What happens before the procedure? Medicines  Ask your health care provider about: ? Changing or stopping your regular medicines. This is especially important if you are taking diabetes medicines or blood thinners. ? Taking medicines such as aspirin and ibuprofen. These medicines can thin your blood. Do not take these medicines before your procedure if your health care provider instructs you not to.  You may  be given antibiotic medicine to help prevent infection. Staying hydrated Follow instructions from your health care provider about hydration, which may include:  Up to 2 hours before the procedure - you may continue to drink clear liquids, such as water, clear fruit juice, black coffee, and plain tea.  Eating and drinking restrictions Follow instructions from your health care provider about eating and drinking, which may include:  8 hours before the procedure - stop eating heavy meals or foods such as meat, fried foods, or fatty foods.  6 hours before the procedure - stop eating light meals or foods, such as toast or cereal.  6 hours before the procedure - stop drinking milk or drinks that contain milk.  2 hours before the procedure - stop drinking clear liquids.  General instructions  Do not use any products that contain nicotine or tobacco, such as cigarettes and e-cigarettes. These can delay healing after surgery. If you need help quitting, ask your health care provider.  You may need to have blood tests, a test to check heart rhythm (electrocardiogram), or a test to check blood flow (angiogram).  Plan to have someone take you home from the hospital or clinic.  Ask your health care provider how your surgical site will be marked or identified.  You may be asked to shower with a germ-killing soap. What happens during the procedure?  To lower your risk of infection: ? Your health care team will wash or sanitize their hands. ? Your skin will be washed with soap. ? Hair may be removed from the surgical area.  An IV tube will be inserted into one of your veins.  You   will be given one or more of the following: ? A medicine to help you relax (sedative). ? A medicine to make you fall asleep (general anesthetic).  The surgeon will make a small incision in your neck to expose the carotid artery.  A tube may be inserted into the carotid artery above and below the blockage. This tube  will temporarily allow blood to flow around the blockage during the surgery.  An incision will be made in the carotid artery at the location of the blockage, then the blockage will be removed. In some cases, a section of the carotid artery may be removed and a graft patch may be used to repair the artery.  The carotid artery will be closed with stitches (sutures).  If a tube was inserted into the artery to allow blood flow around the blockage during surgery, the tube will be removed. With the tube removed, blood flow to the brain will be restored through the carotid artery.  The incision in the neck will be closed with sutures.  A bandage (dressing) will be placed over your incision. The procedure may vary among health care providers and hospitals. What happens after the procedure?  Your blood pressure, heart rate, breathing rate, and blood oxygen level will be monitored until the medicines you were given have worn off.  You may have some pain or an ache in your neck for a couple weeks. This is normal.  Do not drive for 24 hours if you were given a sedative. Summary  A carotid endarterectomy is a surgery to remove a blockage in the carotid arteries.  The carotid arteries are the large blood vessels on both sides of the neck that supply blood to the brain.  Before the procedure, ask your health care provider about changing or stopping your regular medicines.  Follow instructions from your health care provider about eating and drinking before the procedure.  After the procedure, do not drive for 24 hours if you were given a sedative. This information is not intended to replace advice given to you by your health care provider. Make sure you discuss any questions you have with your health care provider. Document Released: 04/13/2013 Document Revised: 06/30/2016 Document Reviewed: 06/30/2016 Elsevier Interactive Patient Education  2017 [employer]  

## 2017-06-17 ENCOUNTER — Encounter
Admission: RE | Admit: 2017-06-17 | Discharge: 2017-06-17 | Disposition: A | Discharge status: Home / Self Care | Payer: PRIVATE HEALTH INSURANCE | Source: Ambulatory Visit | Attending: Vascular Surgery | Admitting: Vascular Surgery

## 2017-06-17 ENCOUNTER — Other Ambulatory Visit (INDEPENDENT_AMBULATORY_CARE_PROVIDER_SITE_OTHER): Payer: Self-pay | Admitting: Vascular Surgery

## 2017-06-17 DIAGNOSIS — Z0183 Encounter for blood typing: Secondary | ICD-10-CM | POA: Insufficient documentation

## 2017-06-17 DIAGNOSIS — F419 Anxiety disorder, unspecified: Secondary | ICD-10-CM | POA: Diagnosis not present

## 2017-06-17 DIAGNOSIS — L409 Psoriasis, unspecified: Secondary | ICD-10-CM | POA: Insufficient documentation

## 2017-06-17 DIAGNOSIS — Z880 Allergy status to penicillin: Secondary | ICD-10-CM | POA: Insufficient documentation

## 2017-06-17 DIAGNOSIS — M1990 Unspecified osteoarthritis, unspecified site: Secondary | ICD-10-CM | POA: Diagnosis not present

## 2017-06-17 DIAGNOSIS — Z87891 Personal history of nicotine dependence: Secondary | ICD-10-CM | POA: Insufficient documentation

## 2017-06-17 DIAGNOSIS — E785 Hyperlipidemia, unspecified: Secondary | ICD-10-CM | POA: Diagnosis not present

## 2017-06-17 DIAGNOSIS — Z888 Allergy status to other drugs, medicaments and biological substances status: Secondary | ICD-10-CM | POA: Insufficient documentation

## 2017-06-17 DIAGNOSIS — Z01812 Encounter for preprocedural laboratory examination: Secondary | ICD-10-CM | POA: Insufficient documentation

## 2017-06-17 DIAGNOSIS — I6523 Occlusion and stenosis of bilateral carotid arteries: Secondary | ICD-10-CM | POA: Diagnosis not present

## 2017-06-17 DIAGNOSIS — Z79899 Other long term (current) drug therapy: Secondary | ICD-10-CM | POA: Diagnosis not present

## 2017-06-17 DIAGNOSIS — E890 Postprocedural hypothyroidism: Secondary | ICD-10-CM | POA: Diagnosis not present

## 2017-06-17 LAB — CBC WITH DIFFERENTIAL/PLATELET
BASOS PCT: 1 %
Basophils Absolute: 0 10*3/uL (ref 0–0.1)
EOS ABS: 0.2 10*3/uL (ref 0–0.7)
Eosinophils Relative: 3 %
HEMATOCRIT: 42.1 % (ref 35.0–47.0)
HEMOGLOBIN: 14.3 g/dL (ref 12.0–16.0)
LYMPHS ABS: 1.5 10*3/uL (ref 1.0–3.6)
Lymphocytes Relative: 23 %
MCH: 32.2 pg (ref 26.0–34.0)
MCHC: 34.1 g/dL (ref 32.0–36.0)
MCV: 94.6 fL (ref 80.0–100.0)
Monocytes Absolute: 0.6 10*3/uL (ref 0.2–0.9)
Monocytes Relative: 8 %
NEUTROS ABS: 4.4 10*3/uL (ref 1.4–6.5)
NEUTROS PCT: 65 %
Platelets: 296 10*3/uL (ref 150–440)
RBC: 4.45 MIL/uL (ref 3.80–5.20)
RDW: 12.9 % (ref 11.5–14.5)
WBC: 6.7 10*3/uL (ref 3.6–11.0)

## 2017-06-17 LAB — SURGICAL PCR SCREEN
MRSA, PCR: NEGATIVE
Staphylococcus aureus: POSITIVE — AB

## 2017-06-17 LAB — TYPE AND SCREEN
ABO/RH(D): A POS
ANTIBODY SCREEN: NEGATIVE

## 2017-06-17 LAB — BASIC METABOLIC PANEL
ANION GAP: 8 (ref 5–15)
BUN: 21 mg/dL — ABNORMAL HIGH (ref 6–20)
CO2: 25 mmol/L (ref 22–32)
CREATININE: 0.7 mg/dL (ref 0.44–1.00)
Calcium: 9.4 mg/dL (ref 8.9–10.3)
Chloride: 107 mmol/L (ref 101–111)
GFR calc non Af Amer: >60 mL/min (ref >60)
Glucose, Bld: 97 mg/dL (ref 65–99)
POTASSIUM: 3.9 mmol/L (ref 3.5–5.1)
SODIUM: 140 mmol/L (ref 135–145)

## 2017-06-17 LAB — PROTIME-INR
INR: 0.97
PROTHROMBIN TIME: 12.8 s (ref 11.4–15.2)

## 2017-06-17 LAB — APTT: APTT: 34 s (ref 24–36)

## 2017-06-17 NOTE — Patient Instructions (Signed)
Your procedure is scheduled on: Wed. 06/24/17 Report to Day Surgery. To find out your arrival time please call [phone] between 1PM - 3PM on Tues. 06/23/17.  Remember: Instructions that are not followed completely may result in serious medical risk, up to and including death, or upon the discretion of your surgeon and anesthesiologist your surgery may need to be rescheduled.     _X__ 1. Do not eat food after midnight the night before your procedure.                 No gum chewing or hard candies. You may drink clear liquids up to 2 hours                 before you are scheduled to arrive for your surgery- DO not drink clear                 liquids within 2 hours of the start of your surgery.                 Clear Liquids include:  water, apple juice without pulp, clear carbohydrate                 drink such as Clearfast of Gartorade, Black Coffee or Tea (Do not add                 anything to coffee or tea).     _X__ 2.  No Alcohol for 24 hours before or after surgery.   _3__ 3.  Do Not Smoke or use e-cigarettes For 24 Hours Prior to Your Surgery.                 Do not use any chewable tobacco products for at least 6 hours prior to                 surgery.  ____  4.  Bring all medications with you on the day of surgery if instructed.   ____  5.  Notify your doctor if there is any change in your medical condition      (cold, fever, infections).     Do not wear jewelry, make-up, hairpins, clips or nail polish. Do not wear lotions, powders, or perfumes. You may wear deodorant. Do not shave 48 hours prior to surgery. Men may shave face and neck. Do not bring valuables to the hospital.    [facility] is not responsible for any belongings or valuables.  Contacts, dentures or bridgework may not be worn into surgery. Leave your suitcase in the car. After surgery it may be brought to your room. For patients admitted to the hospital, discharge time is determined  by your treatment team.   Patients discharged the day of surgery will not be allowed to drive home.   Please read over the following fact sheets that you were given:   MRSA Information   x____ Take these medicines the morning of surgery with A SIP OF WATER:    1. azelastine (ASTELIN) 0.1 % nasal spray if needed  2. citalopram (CELEXA) 20 MG tablet  3. levothyroxine (SYNTHROID, LEVOTHROID) 50 MCG tablet  4.metoprolol tartrate (LOPRESSOR) 25 MG tablet  5.  6.  ____ Fleet Enema (as directed)   __x__ Use CHG Soap as directed  ____ Use inhalers on the day of surgery  ____ Stop metformin 2 days prior to surgery    ____ Take 1/2 of usual insulin dose the night before surgery. No insulin  the morning          of surgery.   ____ Continue aspirin as per Dr. Bunnie Domino Instructions  ____ Stop Anti-inflammatories on    ____ Stop supplements until after surgery.    ____ Bring C-Pap to the hospital.

## 2017-06-23 MED ORDER — VANCOMYCIN HCL IN DEXTROSE 1-5 GM/200ML-% IV SOLN
1000.0000 mg | INTRAVENOUS | Status: AC
Start: 2017-06-24 — End: 2017-06-24
  Administered 2017-06-24: 1000 mg via INTRAVENOUS

## 2017-06-24 ENCOUNTER — Inpatient Hospital Stay: Payer: PRIVATE HEALTH INSURANCE | Admitting: Certified Registered"

## 2017-06-24 ENCOUNTER — Encounter
Admission: RE | Disposition: A | Discharge status: Home / Self Care | Payer: Self-pay | Source: Ambulatory Visit | Attending: Vascular Surgery

## 2017-06-24 ENCOUNTER — Encounter: Payer: Self-pay | Admitting: *Deleted

## 2017-06-24 ENCOUNTER — Inpatient Hospital Stay
Admission: RE | Admit: 2017-06-24 | Discharge: 2017-06-25 | DRG: 039 | Disposition: A | Discharge status: Home / Self Care | Payer: PRIVATE HEALTH INSURANCE | Source: Ambulatory Visit | Attending: Vascular Surgery | Admitting: Vascular Surgery

## 2017-06-24 DIAGNOSIS — I779 Disorder of arteries and arterioles, unspecified: Secondary | ICD-10-CM | POA: Insufficient documentation

## 2017-06-24 DIAGNOSIS — Z23 Encounter for immunization: Secondary | ICD-10-CM

## 2017-06-24 DIAGNOSIS — F419 Anxiety disorder, unspecified: Secondary | ICD-10-CM | POA: Diagnosis present

## 2017-06-24 DIAGNOSIS — Z880 Allergy status to penicillin: Secondary | ICD-10-CM

## 2017-06-24 DIAGNOSIS — J029 Acute pharyngitis, unspecified: Secondary | ICD-10-CM | POA: Diagnosis not present

## 2017-06-24 DIAGNOSIS — I6521 Occlusion and stenosis of right carotid artery: Secondary | ICD-10-CM | POA: Diagnosis not present

## 2017-06-24 DIAGNOSIS — E785 Hyperlipidemia, unspecified: Secondary | ICD-10-CM | POA: Diagnosis present

## 2017-06-24 DIAGNOSIS — Z888 Allergy status to other drugs, medicaments and biological substances status: Secondary | ICD-10-CM

## 2017-06-24 HISTORY — PX: ENDARTERECTOMY: SHX5162

## 2017-06-24 LAB — GLUCOSE, CAPILLARY: Glucose-Capillary: 146 mg/dL — ABNORMAL HIGH (ref 65–99)

## 2017-06-24 SURGERY — ENDARTERECTOMY, CAROTID
Anesthesia: General | Laterality: Right | Wound class: Clean

## 2017-06-24 MED ORDER — ESMOLOL HCL 100 MG/10ML IV SOLN
INTRAVENOUS | Status: DC | PRN
  Administered 2017-06-24: 40 mg via INTRAVENOUS
  Administered 2017-06-24: 20 mg via INTRAVENOUS
  Administered 2017-06-24: 40 mg via INTRAVENOUS

## 2017-06-24 MED ORDER — SUGAMMADEX SODIUM 200 MG/2ML IV SOLN
INTRAVENOUS | Status: AC
  Filled 2017-06-24: qty 2

## 2017-06-24 MED ORDER — SODIUM CHLORIDE 0.9 % IV SOLN: 500.0000 mL | Freq: Once | INTRAVENOUS | Status: DC | PRN

## 2017-06-24 MED ORDER — OXYCODONE-ACETAMINOPHEN 5-325 MG PO TABS: 1.0000 | ORAL_TABLET | ORAL | Status: DC | PRN

## 2017-06-24 MED ORDER — LEVOTHYROXINE SODIUM 50 MCG PO TABS
50.0000 ug | ORAL_TABLET | Freq: Every day | ORAL | Status: DC
  Administered 2017-06-25: 50 ug via ORAL
  Filled 2017-06-24: qty 1

## 2017-06-24 MED ORDER — ONDANSETRON HCL 4 MG/2ML IJ SOLN
INTRAMUSCULAR | Status: DC | PRN
  Administered 2017-06-24 (×2): 4 mg via INTRAVENOUS

## 2017-06-24 MED ORDER — METOPROLOL TARTRATE 5 MG/5ML IV SOLN: 2.0000 mg | INTRAVENOUS | Status: DC | PRN

## 2017-06-24 MED ORDER — METOPROLOL TARTRATE 25 MG PO TABS
25.0000 mg | ORAL_TABLET | Freq: Once | ORAL | Status: AC
  Administered 2017-06-24: 25 mg via ORAL
  Filled 2017-06-24: qty 1

## 2017-06-24 MED ORDER — NITROGLYCERIN IN D5W 200-5 MCG/ML-% IV SOLN: 5.0000 ug/min | INTRAVENOUS | Status: DC

## 2017-06-24 MED ORDER — VANCOMYCIN HCL IN DEXTROSE 1-5 GM/200ML-% IV SOLN
1000.0000 mg | Freq: Two times a day (BID) | INTRAVENOUS | Status: AC
  Administered 2017-06-24 – 2017-06-25 (×2): 1000 mg via INTRAVENOUS
  Filled 2017-06-24 (×2): qty 200

## 2017-06-24 MED ORDER — FAMOTIDINE IN NACL 20-0.9 MG/50ML-% IV SOLN
20.0000 mg | Freq: Two times a day (BID) | INTRAVENOUS | Status: DC
  Administered 2017-06-24 – 2017-06-25 (×3): 20 mg via INTRAVENOUS
  Filled 2017-06-24 (×3): qty 50

## 2017-06-24 MED ORDER — PROPOFOL 10 MG/ML IV BOLUS
INTRAVENOUS | Status: DC | PRN
  Administered 2017-06-24: 150 mg via INTRAVENOUS

## 2017-06-24 MED ORDER — PHENYLEPHRINE HCL 10 MG/ML IJ SOLN
INTRAMUSCULAR | Status: AC
  Filled 2017-06-24: qty 1

## 2017-06-24 MED ORDER — ESMOLOL HCL 100 MG/10ML IV SOLN
INTRAVENOUS | Status: AC
  Filled 2017-06-24: qty 10

## 2017-06-24 MED ORDER — SODIUM CHLORIDE 0.9 % IV SOLN
INTRAVENOUS | Status: DC
  Administered 2017-06-24 – 2017-06-25 (×2): via INTRAVENOUS

## 2017-06-24 MED ORDER — ASPIRIN-ACETAMINOPHEN-CAFFEINE 250-250-65 MG PO TABS
1.0000 | ORAL_TABLET | Freq: Four times a day (QID) | ORAL | Status: DC | PRN
  Administered 2017-06-24: 1 via ORAL
  Filled 2017-06-24 (×2): qty 1

## 2017-06-24 MED ORDER — ROCURONIUM BROMIDE 50 MG/5ML IV SOLN
INTRAVENOUS | Status: AC
  Filled 2017-06-24: qty 1

## 2017-06-24 MED ORDER — ONDANSETRON HCL 4 MG/2ML IJ SOLN: 4.0000 mg | Freq: Once | INTRAMUSCULAR | Status: DC | PRN

## 2017-06-24 MED ORDER — CHLORHEXIDINE GLUCONATE CLOTH 2 % EX PADS: 6.0000 | MEDICATED_PAD | Freq: Once | CUTANEOUS | Status: DC

## 2017-06-24 MED ORDER — ALUM & MAG HYDROXIDE-SIMETH 200-200-20 MG/5ML PO SUSP: 15.0000 mL | ORAL | Status: DC | PRN

## 2017-06-24 MED ORDER — LIDOCAINE HCL (PF) 2 % IJ SOLN
INTRAMUSCULAR | Status: AC
Start: 2017-06-24 — End: 2017-06-24
  Filled 2017-06-24: qty 10

## 2017-06-24 MED ORDER — ONDANSETRON HCL 4 MG/2ML IJ SOLN
4.0000 mg | Freq: Once | INTRAMUSCULAR | Status: DC | PRN
Start: 2017-06-24 — End: 2017-06-24

## 2017-06-24 MED ORDER — LACTATED RINGERS IV SOLN
INTRAVENOUS | Status: DC
  Administered 2017-06-24: 07:00:00 via INTRAVENOUS

## 2017-06-24 MED ORDER — FENTANYL CITRATE (PF) 100 MCG/2ML IJ SOLN: 25.0000 ug | INTRAMUSCULAR | Status: DC | PRN

## 2017-06-24 MED ORDER — LIDOCAINE HCL (CARDIAC) 20 MG/ML IV SOLN
INTRAVENOUS | Status: DC | PRN
  Administered 2017-06-24: 20 mg via INTRAVENOUS
  Administered 2017-06-24: 40 mg via INTRAVENOUS

## 2017-06-24 MED ORDER — FENTANYL CITRATE (PF) 100 MCG/2ML IJ SOLN
INTRAMUSCULAR | Status: DC | PRN
  Administered 2017-06-24: 50 ug via INTRAVENOUS
  Administered 2017-06-24: 100 ug via INTRAVENOUS

## 2017-06-24 MED ORDER — ACETAMINOPHEN 325 MG RE SUPP
325.0000 mg | RECTAL | Status: DC | PRN
  Filled 2017-06-24: qty 2

## 2017-06-24 MED ORDER — GUAIFENESIN-DM 100-10 MG/5ML PO SYRP: 15.0000 mL | ORAL_SOLUTION | ORAL | Status: DC | PRN

## 2017-06-24 MED ORDER — FENTANYL CITRATE (PF) 100 MCG/2ML IJ SOLN
INTRAMUSCULAR | Status: AC
  Filled 2017-06-24: qty 2

## 2017-06-24 MED ORDER — MORPHINE SULFATE (PF) 4 MG/ML IV SOLN: 2.0000 mg | INTRAVENOUS | Status: DC | PRN

## 2017-06-24 MED ORDER — FAMOTIDINE 20 MG PO TABS
ORAL_TABLET | ORAL | Status: AC
  Filled 2017-06-24: qty 1

## 2017-06-24 MED ORDER — HEPARIN SODIUM (PORCINE) 1000 UNIT/ML IJ SOLN
INTRAMUSCULAR | Status: DC | PRN
  Administered 2017-06-24: 5000 [IU] via INTRAVENOUS

## 2017-06-24 MED ORDER — VANCOMYCIN HCL IN DEXTROSE 1-5 GM/200ML-% IV SOLN
INTRAVENOUS | Status: AC
  Administered 2017-06-24: 1000 mg via INTRAVENOUS
  Filled 2017-06-24: qty 200

## 2017-06-24 MED ORDER — HEPARIN SODIUM (PORCINE) 10000 UNIT/ML IJ SOLN
INTRAMUSCULAR | Status: AC
  Filled 2017-06-24: qty 1

## 2017-06-24 MED ORDER — MAGNESIUM SULFATE 2 GM/50ML IV SOLN: 2.0000 g | Freq: Every day | INTRAVENOUS | Status: DC | PRN

## 2017-06-24 MED ORDER — GLYCOPYRROLATE 0.2 MG/ML IJ SOLN
INTRAMUSCULAR | Status: AC
  Filled 2017-06-24: qty 1

## 2017-06-24 MED ORDER — CLOPIDOGREL BISULFATE 75 MG PO TABS
75.0000 mg | ORAL_TABLET | Freq: Every day | ORAL | Status: DC
  Administered 2017-06-25: 75 mg via ORAL
  Filled 2017-06-24: qty 1

## 2017-06-24 MED ORDER — MIDAZOLAM HCL 2 MG/2ML IJ SOLN
INTRAMUSCULAR | Status: DC | PRN
  Administered 2017-06-24: 2 mg via INTRAVENOUS

## 2017-06-24 MED ORDER — ACETAMINOPHEN 325 MG PO TABS: 325.0000 mg | ORAL_TABLET | ORAL | Status: DC | PRN

## 2017-06-24 MED ORDER — PROMETHAZINE HCL 25 MG/ML IJ SOLN
12.5000 mg | Freq: Four times a day (QID) | INTRAMUSCULAR | Status: DC | PRN
  Administered 2017-06-24: 25 mg via INTRAVENOUS
  Filled 2017-06-24: qty 1

## 2017-06-24 MED ORDER — SUCCINYLCHOLINE CHLORIDE 20 MG/ML IJ SOLN
INTRAMUSCULAR | Status: AC
  Filled 2017-06-24: qty 1

## 2017-06-24 MED ORDER — DEXAMETHASONE SODIUM PHOSPHATE 10 MG/ML IJ SOLN
INTRAMUSCULAR | Status: DC | PRN
  Administered 2017-06-24: 4 mg via INTRAVENOUS

## 2017-06-24 MED ORDER — ESMOLOL HCL-SODIUM CHLORIDE 2000 MG/100ML IV SOLN: 25.0000 ug/kg/min | INTRAVENOUS | Status: DC

## 2017-06-24 MED ORDER — ALPRAZOLAM 0.5 MG PO TABS: 0.2500 mg | ORAL_TABLET | Freq: Every evening | ORAL | Status: DC | PRN

## 2017-06-24 MED ORDER — MIDAZOLAM HCL 2 MG/2ML IJ SOLN
INTRAMUSCULAR | Status: AC
  Filled 2017-06-24: qty 2

## 2017-06-24 MED ORDER — LABETALOL HCL 5 MG/ML IV SOLN
INTRAVENOUS | Status: DC | PRN
  Administered 2017-06-24 (×3): 10 mg via INTRAVENOUS

## 2017-06-24 MED ORDER — PROPOFOL 10 MG/ML IV BOLUS
INTRAVENOUS | Status: AC
  Filled 2017-06-24: qty 20

## 2017-06-24 MED ORDER — EVICEL 2 ML EX KIT
PACK | CUTANEOUS | Status: AC
  Filled 2017-06-24: qty 1

## 2017-06-24 MED ORDER — FAMOTIDINE 20 MG PO TABS
20.0000 mg | ORAL_TABLET | Freq: Once | ORAL | Status: AC
  Administered 2017-06-24: 20 mg via ORAL

## 2017-06-24 MED ORDER — LABETALOL HCL 5 MG/ML IV SOLN: 10.0000 mg | INTRAVENOUS | Status: DC | PRN

## 2017-06-24 MED ORDER — PHENOL 1.4 % MT LIQD: 1.0000 | OROMUCOSAL | Status: DC | PRN

## 2017-06-24 MED ORDER — LIDOCAINE HCL 1 % IJ SOLN
INTRAMUSCULAR | Status: DC | PRN
  Administered 2017-06-24: 10 mL

## 2017-06-24 MED ORDER — HYDRALAZINE HCL 20 MG/ML IJ SOLN: 5.0000 mg | INTRAMUSCULAR | Status: DC | PRN

## 2017-06-24 MED ORDER — SUGAMMADEX SODIUM 200 MG/2ML IV SOLN
INTRAVENOUS | Status: DC | PRN
  Administered 2017-06-24: 150 mg via INTRAVENOUS

## 2017-06-24 MED ORDER — ATORVASTATIN CALCIUM 10 MG PO TABS
10.0000 mg | ORAL_TABLET | Freq: Every day | ORAL | Status: DC
  Administered 2017-06-24 – 2017-06-25 (×2): 10 mg via ORAL
  Filled 2017-06-24 (×2): qty 1

## 2017-06-24 MED ORDER — CALCIUM CARBONATE ANTACID 500 MG PO CHEW
500.0000 mg | CHEWABLE_TABLET | Freq: Every day | ORAL | Status: DC
  Administered 2017-06-24 – 2017-06-25 (×2): 500 mg via ORAL
  Filled 2017-06-24 (×2): qty 3

## 2017-06-24 MED ORDER — CITALOPRAM HYDROBROMIDE 20 MG PO TABS
20.0000 mg | ORAL_TABLET | Freq: Every day | ORAL | Status: DC
  Administered 2017-06-24 – 2017-06-25 (×2): 20 mg via ORAL
  Filled 2017-06-24 (×2): qty 1

## 2017-06-24 MED ORDER — DOCUSATE SODIUM 100 MG PO CAPS
100.0000 mg | ORAL_CAPSULE | Freq: Every day | ORAL | Status: DC
  Administered 2017-06-25: 100 mg via ORAL
  Filled 2017-06-24: qty 1

## 2017-06-24 MED ORDER — SODIUM CHLORIDE 0.9 % IV SOLN
INTRAVENOUS | Status: DC | PRN
  Administered 2017-06-24: 20 ug/min via INTRAVENOUS

## 2017-06-24 MED ORDER — GLYCOPYRROLATE 0.2 MG/ML IJ SOLN
INTRAMUSCULAR | Status: DC | PRN
  Administered 2017-06-24: 0.2 mg via INTRAVENOUS

## 2017-06-24 MED ORDER — POTASSIUM CHLORIDE CRYS ER 20 MEQ PO TBCR
20.0000 meq | EXTENDED_RELEASE_TABLET | Freq: Every day | ORAL | Status: DC | PRN
Start: 2017-06-24 — End: 2017-06-25

## 2017-06-24 MED ORDER — SUCCINYLCHOLINE CHLORIDE 20 MG/ML IJ SOLN
INTRAMUSCULAR | Status: DC | PRN
  Administered 2017-06-24: 90 mg via INTRAVENOUS

## 2017-06-24 MED ORDER — ROCURONIUM BROMIDE 100 MG/10ML IV SOLN
INTRAVENOUS | Status: DC | PRN
  Administered 2017-06-24 (×2): 5 mg via INTRAVENOUS
  Administered 2017-06-24 (×2): 10 mg via INTRAVENOUS
  Administered 2017-06-24: 30 mg via INTRAVENOUS

## 2017-06-24 MED ORDER — ONDANSETRON HCL 4 MG/2ML IJ SOLN
4.0000 mg | Freq: Four times a day (QID) | INTRAMUSCULAR | Status: DC | PRN
  Administered 2017-06-24: 4 mg via INTRAVENOUS
  Filled 2017-06-24: qty 2

## 2017-06-24 MED ORDER — CEFAZOLIN SODIUM 1 G IJ SOLR
INTRAMUSCULAR | Status: AC
  Filled 2017-06-24: qty 10

## 2017-06-24 MED ORDER — NITROGLYCERIN IN D5W 200-5 MCG/ML-% IV SOLN
INTRAVENOUS | Status: AC
  Filled 2017-06-24: qty 250

## 2017-06-24 MED ORDER — LIDOCAINE HCL (PF) 4 % IJ SOLN
INTRAMUSCULAR | Status: DC | PRN
  Administered 2017-06-24: 1 mL

## 2017-06-24 MED ORDER — EPHEDRINE SULFATE 50 MG/ML IJ SOLN
INTRAMUSCULAR | Status: DC | PRN
  Administered 2017-06-24 (×2): 10 mg via INTRAVENOUS
  Administered 2017-06-24: 15 mg via INTRAVENOUS
  Administered 2017-06-24: 5 mg via INTRAVENOUS

## 2017-06-24 MED ORDER — DEXAMETHASONE SODIUM PHOSPHATE 10 MG/ML IJ SOLN
INTRAMUSCULAR | Status: AC
  Filled 2017-06-24: qty 1

## 2017-06-24 MED ORDER — ASPIRIN EC 81 MG PO TBEC
81.0000 mg | DELAYED_RELEASE_TABLET | Freq: Every day | ORAL | Status: DC
  Administered 2017-06-24 – 2017-06-25 (×2): 81 mg via ORAL
  Filled 2017-06-24 (×2): qty 1

## 2017-06-24 MED ORDER — LIDOCAINE HCL (PF) 1 % IJ SOLN
INTRAMUSCULAR | Status: AC
  Filled 2017-06-24: qty 30

## 2017-06-24 MED ORDER — ONDANSETRON HCL 4 MG/2ML IJ SOLN
INTRAMUSCULAR | Status: AC
  Filled 2017-06-24: qty 2

## 2017-06-24 SURGICAL SUPPLY — 64 items
ADH SKN CLS APL DERMABOND .7 (GAUZE/BANDAGES/DRESSINGS) ×1
BAG DECANTER FOR FLEXI CONT (MISCELLANEOUS) ×2 IMPLANT
BLADE SURG 15 STRL LF DISP TIS (BLADE) ×1 IMPLANT
BLADE SURG 15 STRL SS (BLADE) ×2
BLADE SURG SZ11 CARB STEEL (BLADE) ×2 IMPLANT
BOOT SUTURE AID YELLOW STND (SUTURE) ×2 IMPLANT
BRUSH SCRUB EZ  4% CHG (MISCELLANEOUS) ×1
BRUSH SCRUB EZ 4% CHG (MISCELLANEOUS) ×1 IMPLANT
CANISTER SUCT 1200ML W/VALVE (MISCELLANEOUS) ×2 IMPLANT
DERMABOND ADVANCED (GAUZE/BANDAGES/DRESSINGS) ×1
DERMABOND ADVANCED .7 DNX12 (GAUZE/BANDAGES/DRESSINGS) ×1 IMPLANT
DRAPE INCISE IOBAN 66X45 STRL (DRAPES) ×2 IMPLANT
DRAPE LAPAROTOMY 77X122 PED (DRAPES) ×2 IMPLANT
DRAPE SHEET LG 3/4 BI-LAMINATE (DRAPES) ×2 IMPLANT
DRSG TEGADERM 4X4.75 (GAUZE/BANDAGES/DRESSINGS) IMPLANT
DRSG TELFA 3X8 NADH (GAUZE/BANDAGES/DRESSINGS) IMPLANT
DURAPREP 26ML APPLICATOR (WOUND CARE) ×2 IMPLANT
ELECT CAUTERY BLADE 6.4 (BLADE) ×2 IMPLANT
ELECT REM PT RETURN 9FT ADLT (ELECTROSURGICAL) ×2
ELECTRODE REM PT RTRN 9FT ADLT (ELECTROSURGICAL) ×1 IMPLANT
GLOVE BIO SURGEON STRL SZ7 (GLOVE) ×6 IMPLANT
GLOVE INDICATOR 7.5 STRL GRN (GLOVE) ×2 IMPLANT
GOWN STRL REUS W/ TWL LRG LVL3 (GOWN DISPOSABLE) ×2 IMPLANT
GOWN STRL REUS W/ TWL XL LVL3 (GOWN DISPOSABLE) ×2 IMPLANT
GOWN STRL REUS W/TWL LRG LVL3 (GOWN DISPOSABLE) ×4
GOWN STRL REUS W/TWL XL LVL3 (GOWN DISPOSABLE) ×2
HEMOSTAT SURGICEL 2X3 (HEMOSTASIS) ×2 IMPLANT
IV NS 250ML (IV SOLUTION) ×2
IV NS 250ML BAXH (IV SOLUTION) ×1 IMPLANT
KIT RM TURNOVER STRD PROC AR (KITS) ×2 IMPLANT
LABEL OR SOLS (LABEL) ×2 IMPLANT
LOOP RED MAXI  1X406MM (MISCELLANEOUS) ×2
LOOP VESSEL MAXI 1X406 RED (MISCELLANEOUS) ×2 IMPLANT
LOOP VESSEL MINI 0.8X406 BLUE (MISCELLANEOUS) ×1 IMPLANT
LOOPS BLUE MINI 0.8X406MM (MISCELLANEOUS) ×1
NDL FILTER BLUNT 18X1 1/2 (NEEDLE) ×1 IMPLANT
NDL HYPO 25X1 1.5 SAFETY (NEEDLE) ×1 IMPLANT
NEEDLE FILTER BLUNT 18X 1/2SAF (NEEDLE) ×1
NEEDLE FILTER BLUNT 18X1 1/2 (NEEDLE) ×1 IMPLANT
NEEDLE HYPO 25X1 1.5 SAFETY (NEEDLE) ×2 IMPLANT
NS IRRIG 1000ML POUR BTL (IV SOLUTION) ×2 IMPLANT
PACK BASIN MAJOR ARMC (MISCELLANEOUS) ×2 IMPLANT
PAD DRESSING TELFA 3X8 NADH (GAUZE/BANDAGES/DRESSINGS) IMPLANT
PATCH CAROTID ECM VASC 1X10 (Prosthesis & Implant Heart) ×2 IMPLANT
PENCIL ELECTRO HAND CTR (MISCELLANEOUS) IMPLANT
SHUNT W TPORT 9FR PRUITT F3 (SHUNT) ×2 IMPLANT
SUT MNCRL 4-0 (SUTURE) ×2
SUT MNCRL 4-0 27XMFL (SUTURE) ×1
SUT PROLENE 6 0 BV (SUTURE) ×8 IMPLANT
SUT PROLENE 7 0 BV 1 (SUTURE) ×4 IMPLANT
SUT SILK 2 0 (SUTURE) ×2
SUT SILK 2-0 18XBRD TIE 12 (SUTURE) ×1 IMPLANT
SUT SILK 3 0 (SUTURE) ×2
SUT SILK 3-0 18XBRD TIE 12 (SUTURE) ×1 IMPLANT
SUT SILK 4 0 (SUTURE) ×2
SUT SILK 4-0 18XBRD TIE 12 (SUTURE) ×1 IMPLANT
SUT VIC AB 3-0 SH 27 (SUTURE) ×4
SUT VIC AB 3-0 SH 27X BRD (SUTURE) ×2 IMPLANT
SUTURE MNCRL 4-0 27XMF (SUTURE) ×1 IMPLANT
SYR 20CC LL (SYRINGE) ×2 IMPLANT
SYRINGE 10CC LL (SYRINGE) ×4 IMPLANT
TOWEL OR 17X26 4PK STRL BLUE (TOWEL DISPOSABLE) ×1 IMPLANT
TRAY FOLEY W/METER SILVER 16FR (SET/KITS/TRAYS/PACK) ×2 IMPLANT
TUBING CONNECTING 10 (TUBING) IMPLANT

## 2017-06-24 NOTE — H&P (Signed)
[facility] History & Physical Update  The patient was interviewed and re-examined.  The patient's previous History and Physical has been reviewed and is unchanged.  There is no change in the plan of care. We plan to proceed with the scheduled procedure.  Leotis Pain, MD  06/24/2017, 7:22 AM

## 2017-06-24 NOTE — Anesthesia Procedure Notes (Signed)
Arterial Line Insertion Performed by: Caprice Renshaw, CRNA  Patient location: OR. Preanesthetic checklist: patient identified, IV checked, site marked, risks and benefits discussed, surgical consent, monitors and equipment checked, pre-op evaluation, timeout performed and anesthesia consent Left, radial was placed Catheter size: 20 G Hand hygiene performed  and Seldinger technique used Allen's test indicative of satisfactory collateral circulation Attempts: 1 Following insertion, Biopatch and dressing applied. Patient tolerated the procedure well with no immediate complications.

## 2017-06-24 NOTE — Op Note (Signed)
VEIN AND VASCULAR SURGERY   OPERATIVE NOTE  PROCEDURE:   1.  Right carotid endarterectomy with CorMatrix arterial patch reconstruction  PRE-OPERATIVE DIAGNOSIS: 1.  Right carotid stenosis   POST-OPERATIVE DIAGNOSIS: same as above   SURGEON: Leotis Pain, MD  ASSISTANT(S): none  ANESTHESIA: general  ESTIMATED BLOOD LOSS: 60 cc  FINDING(S): 1.  Right carotid plaque.  SPECIMEN(S):  Carotid plaque (sent to Pathology)  INDICATIONS:   [name] is a 61 y.o. female who presents with Right carotid stenosis of >80%.  I discussed with the patient the risks, benefits, and alternatives to carotid endarterectomy.  I discussed the differences between carotid stenting and carotid endarterectomy. I discussed the procedural details of carotid endarterectomy with the patient.  The patient is aware that the risks of carotid endarterectomy include but are not limited to: bleeding, infection, stroke, myocardial infarction, death, cranial nerve injuries both temporary and permanent, neck hematoma, possible airway compromise, labile blood pressure post-operatively, cerebral hyperperfusion syndrome, and possible need for additional interventions in the future. The patient is aware of the risks and agrees to proceed forward with the procedure.  DESCRIPTION: After full informed written consent was obtained from the patient, the patient was brought back to the operating room and placed supine upon the operating table.  Prior to induction, the patient received IV antibiotics.  After obtaining adequate anesthesia, the patient was placed into a modified beach chair position with a shoulder roll in place and the patient's neck slightly hyperextended and rotated away from the surgical site.  The patient was prepped in the standard fashion for a carotid endarterectomy.  I made an incision anterior to the sternocleidomastoid muscle and dissected down through the subcutaneous tissue.  The platysmas was  opened with electrocautery.  Then I dissected down to the internal jugular vein and facial vein.  The facial vein is ligated and divided between 2-0 silk ties.  This was dissected posteriorly until I obtained visualization of the common carotid artery.  This was dissected out and then a vessel loop was placed around the common carotid artery.  I then dissected in a periadventitial fashion along the common carotid artery up to the bifurcation.  I then identified the external carotid artery and the superior thyroid artery.  I placed a vessel loop around the superior thyroid artery, and I also dissected out the external carotid artery and placed a vessel loop around it. In the process of this dissection, the hypoglossal nerve was identified and protected from harm.  I then dissected out the internal carotid artery until I identified an area in the internal carotid artery clearly above the stenosis.  I dissected slightly distal to this area, and placed a vessel loop around the artery.  At this point, we gave the patient 5000 units of intravenous heparin.  After this was allowed to circulate for several minutes, I pulled up control on the vessel loops to clamp the internal carotid artery, external carotid artery, superior thyroid artery, and then the common carotid artery.  I then made an arteriotomy in the common carotid artery with a 11 blade, and extended the arteriotomy with a Potts scissor down into the common carotid artery, then I carried the arteriotomy through the bifurcation into the internal carotid artery until I reached an area that was not diseased.  At this point, I took the Pruitt-[place] shunt that previously been prepared and I inserted it into the internal carotid artery first, and then into the common carotid artery taking  care to flush and de-air prior to release of control. At this point, I started the endarterectomy in the common carotid artery with a Penfield elevator and carried this dissection  down into the common carotid artery circumferentially.  Then I transected the plaque at a segment where it was adherent and transected the plaque with Potts scissors.  I then carried this dissection up into the external carotid artery.  The plaque was extracted by unclamping the external carotid artery and performing an eversion endarterectomy.  The dissection was then carried into the internal carotid artery where a nice feathered end point was created with gentle traction.  I passed the plaque off the field as a specimen. At this point I removed all loose flecks and remaining disease possible.  At this point, I was satisfied that the minimal remaining disease was densely adherent to the wall and wall integrity was intact. The distal endpoint was tacked down with two 7-0 Prolene sutures.  I then fashioned a CorMatrix arterial patch for the artery and sewed it in place with two running stitch of 6-0 Prolene.  I started at the distal endpoint and ran one half the length of the arteriotomy.  I then cut and beveled the patch to an appropriate length to match the arteriotomy.  I started the second 6-0 Prolene at the proximal end point.  The medial suture line was completed and the lateral suture line was run approximately one quarter the length of the arteriotomy.  Prior to completing this patch angioplasty, I removed the shunt first from the internal carotid artery, from which there was excellent backbleeding, and clamped it.  Then I removed the shunt from the common carotid artery, from which there was excellent antegrade bleeding, and then clamped it.  At this point, I allowed the external carotid artery to backbleed, which was excellent.  Then I instilled heparinized saline in this patched artery and then completed the patch angioplasty in the usual fashion.  First, I released the clamp on the external carotid artery, then I released it on the common carotid artery.  After waiting a few seconds, I then released it  on the internal carotid artery. Several minutes of pressure were held and 6-0 Prolene patch sutures were used as need for hemostasis.  At this point, I placed Surgicel and Evicel topical hemostatic agents.  There was no more active bleeding in the surgical site.  The sternocleidomastoid space was closed with three interrupted 3-0 Vicryl sutures. I then reapproximated the platysma muscle with a running stitch of 3-0 Vicryl.  The skin was then closed with a running subcuticular 4-0 Monocryl.  The skin was then cleaned, dried and Dermabond was used to reinforce the skin closure.  The patient awakened and was taken to the recovery room in stable condition, following commands and moving all four extremities without any apparent deficits.    COMPLICATIONS: none  CONDITION: stable  Leotis Pain  06/24/2017, 9:44 AM    This note was created with Dragon Medical transcription system. Any errors in dictation are purely unintentional.

## 2017-06-24 NOTE — Anesthesia Post-op Follow-up Note (Signed)
Anesthesia QCDR form completed.        

## 2017-06-24 NOTE — Transfer of Care (Signed)
Immediate Anesthesia Transfer of Care Note  Patient: Annette [name]  Procedure(s) Performed: ENDARTERECTOMY CAROTID (Right )  Patient Location: PACU  Anesthesia Type:General  Level of Consciousness: awake, alert  and responds to stimulation  Airway & Oxygen Therapy: Patient Spontanous Breathing and Patient connected to face mask oxygen  Post-op Assessment: Report given to RN and Post -op Vital signs reviewed and stable  Post vital signs: Reviewed and stable  Last Vitals:  Vitals:   06/24/17 0657 06/24/17 1000  BP: (!) 147/48 (!) 143/88  Pulse: 61 84  Resp: 16 (!) 24  Temp: 36.5 C   SpO2: 99% 99%    Last Pain:  Vitals:   06/24/17 0657  TempSrc: Oral         Complications: No apparent anesthesia complications

## 2017-06-24 NOTE — Anesthesia Preprocedure Evaluation (Addendum)
Anesthesia Evaluation  Patient identified by MRN, date of birth, ID band Patient awake    Reviewed: Allergy & Precautions, H&P , NPO status , Patient's Chart, lab work & pertinent test results  Airway Mallampati: III  TM Distance: <3 FB Neck ROM: full    Dental no notable dental hx.    Pulmonary former smoker,    Pulmonary exam normal        Cardiovascular + Peripheral Vascular Disease   Rhythm:regular Rate:Normal     Neuro/Psych  Headaches, Anxiety  Neuromuscular disease    GI/Hepatic negative GI ROS, Neg liver ROS,   Endo/Other  Hypothyroidism   Renal/GU   negative genitourinary   Musculoskeletal  (+) Arthritis , Osteoarthritis,    Abdominal   Peds negative pediatric ROS (+)  Hematology negative hematology ROS (+)   Anesthesia Other Findings Past Medical History: No date: Anxiety No date: Arthritis     Comment:  FINGERS, knees No date: Family history of adverse reaction to anesthesia     Comment:  SISTERS BOTH GET NAUSEATED No date: Headache     Comment:  migraines - precipitated by extended use of               celebrex/meloxicam No date: Hyperlipidemia 9/10: Hypothyroidism     Comment:  s/p ablation/ GRAVES DISEASE No date: Neuromuscular disorder (HCC)     Comment:  NUMBNESS IN HANDS OR LIPS OCCASIONALLY No date: Psoriasis  Reproductive/Obstetrics                            Anesthesia Physical  Anesthesia Plan  ASA: II  Anesthesia Plan: General   Post-op Pain Management:    Induction:   PONV Risk Score and Plan:   Airway Management Planned: Oral ETT  Additional Equipment: Arterial line  Intra-op Plan:   Post-operative Plan:   Informed Consent: I have reviewed the patients History and Physical, chart, labs and discussed the procedure including the risks, benefits and alternatives for the proposed anesthesia with the patient or authorized representative who has  indicated his/her understanding and acceptance.     Plan Discussed with: CRNA  Anesthesia Plan Comments:         Anesthesia Quick Evaluation

## 2017-06-24 NOTE — Anesthesia Procedure Notes (Addendum)
Procedure Name: Intubation Performed by: Lance Muss Pre-anesthesia Checklist: Patient identified, Patient being monitored, Timeout performed, Emergency Drugs available and Suction available Patient Re-evaluated:Patient Re-evaluated prior to induction Oxygen Delivery Method: Circle system utilized Preoxygenation: Pre-oxygenation with 100% oxygen Induction Type: IV induction Ventilation: Mask ventilation without difficulty Laryngoscope Size: 3 and McGraph Grade View: Grade II Tube type: Oral Tube size: 7.0 mm Number of attempts: 1 Airway Equipment and Method: Stylet and LTA kit utilized Placement Confirmation: ETT inserted through vocal cords under direct vision,  positive ETCO2 and breath sounds checked- equal and bilateral Secured at: 22 cm Tube secured with: Tape Dental Injury: Teeth and Oropharynx as per pre-operative assessment  Difficulty Due To: Difficult Airway- due to anterior larynx Future Recommendations: Recommend- induction with short-acting agent, and alternative techniques readily available

## 2017-06-24 NOTE — Progress Notes (Signed)
[facility]  Daily Progress Note   Subjective  - Day of Surgery  Feeling restless and uncomfortable.  Has had a fair bit of nausea.  Objective Vitals:   06/24/17 1200 06/24/17 1300 06/24/17 1400 06/24/17 1500  BP: (!) 130/59 (!) 112/52 125/63 136/61  Pulse: 66 78 86 89  Resp: 15 16 18 20   Temp: 98.1 F (36.7 C)     TempSrc:      SpO2: 95% 94% 95% 96%  Weight:      Height:        Intake/Output Summary (Last 24 hours) at 06/24/17 1545 Last data filed at 06/24/17 0943  Gross per 24 hour  Intake              700 ml  Output              310 ml  Net              390 ml    PULM  CTAB CV  RRR VASC  neck with minimal swelling and incision is clean, dry, and intact.  Neuro exam is normal  Laboratory CBC    Component Value Date/Time   WBC 6.7 06/17/2017 1201   HGB 14.3 06/17/2017 1201   HCT 42.1 06/17/2017 1201   PLT 296 06/17/2017 1201    BMET    Component Value Date/Time   NA 140 06/17/2017 1201   K 3.9 06/17/2017 1201   CL 107 06/17/2017 1201   CO2 25 06/17/2017 1201   GLUCOSE 97 06/17/2017 1201   BUN 21 (H) 06/17/2017 1201   CREATININE 0.70 06/17/2017 1201   CALCIUM 9.4 06/17/2017 1201   GFRNONAA >60 06/17/2017 1201   GFRAA >60 06/17/2017 1201    Assessment/Planning: POD #0 s/p right CEA   Overall is actually doing quite well.  Will stick with liquids overnight given her nausea  Check labs in the morning  Blood pressure and heart rate have been reasonably stable although we will continue to monitor these closely in the ICU tonight.      Leotis Pain  06/24/2017, 3:45 PM

## 2017-06-24 NOTE — OR Nursing (Signed)
5000 units of Heparin given by anesthesia at [phone]

## 2017-06-25 LAB — CBC
HEMATOCRIT: 34.2 % — AB (ref 35.0–47.0)
HEMOGLOBIN: 11.8 g/dL — AB (ref 12.0–16.0)
MCH: 32.9 pg (ref 26.0–34.0)
MCHC: 34.5 g/dL (ref 32.0–36.0)
MCV: 95.3 fL (ref 80.0–100.0)
Platelets: 253 10*3/uL (ref 150–440)
RBC: 3.59 MIL/uL — ABNORMAL LOW (ref 3.80–5.20)
RDW: 13.1 % (ref 11.5–14.5)
WBC: 8.4 10*3/uL (ref 3.6–11.0)

## 2017-06-25 LAB — BASIC METABOLIC PANEL
Anion gap: 4 — ABNORMAL LOW (ref 5–15)
BUN: 10 mg/dL (ref 6–20)
CHLORIDE: 110 mmol/L (ref 101–111)
CO2: 27 mmol/L (ref 22–32)
Calcium: 8.9 mg/dL (ref 8.9–10.3)
Creatinine, Ser: 0.84 mg/dL (ref 0.44–1.00)
GFR calc Af Amer: >60 mL/min (ref >60)
GFR calc non Af Amer: >60 mL/min (ref >60)
Glucose, Bld: 120 mg/dL — ABNORMAL HIGH (ref 65–99)
POTASSIUM: 4 mmol/L (ref 3.5–5.1)
SODIUM: 141 mmol/L (ref 135–145)

## 2017-06-25 LAB — SURGICAL PATHOLOGY

## 2017-06-25 MED ORDER — OXYCODONE-ACETAMINOPHEN 5-325 MG PO TABS: 1.0000 | ORAL_TABLET | ORAL | 0 refills | Status: DC | PRN

## 2017-06-25 MED ORDER — INFLUENZA VAC SPLIT QUAD 0.5 ML IM SUSY: 0.5000 mL | PREFILLED_SYRINGE | INTRAMUSCULAR | Status: DC

## 2017-06-25 MED ORDER — INFLUENZA VAC SPLIT QUAD 0.5 ML IM SUSY
0.5000 mL | PREFILLED_SYRINGE | Freq: Once | INTRAMUSCULAR | Status: AC
  Administered 2017-06-25: 0.5 mL via INTRAMUSCULAR
  Filled 2017-06-25: qty 0.5

## 2017-06-25 MED ORDER — CLOPIDOGREL BISULFATE 75 MG PO TABS: 75.0000 mg | ORAL_TABLET | Freq: Every day | ORAL | 3 refills | Status: DC

## 2017-06-25 NOTE — Discharge Summary (Signed)
[facility]    Discharge Summary    Patient ID:  [name] MRN: [id number] DOB/AGE: [birth year] 61 y.o.  Admit date: 06/24/2017 Discharge date: 06/25/2017 Date of Surgery: 06/24/2017 Surgeon: Surgeon(s): Dew, Erskine Squibb, MD  Admission Diagnosis: CAROTID ARTERY STENOSIS  Discharge Diagnoses:  CAROTID ARTERY STENOSIS  Secondary Diagnoses: Past Medical History:  Diagnosis Date  . Anxiety   . Arthritis    FINGERS, knees  . Family history of adverse reaction to anesthesia    SISTERS BOTH GET NAUSEATED  . Headache    migraines - precipitated by extended use of celebrex/meloxicam  . Hyperlipidemia   . Hypothyroidism 9/10   s/p ablation/ GRAVES DISEASE  . 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 Psoriasis     Procedure(s): ENDARTERECTOMY CAROTID  Discharged Condition: good  HPI:  Patient with 80% or greater right ICA stenosis.  Hospital Course:  Annette [name] is a 61 y.o. female is S/P Right Procedure(s): ENDARTERECTOMY CAROTID Extubated: POD # 0 Physical exam: neck with minimal swelling, neuro exam intact Post-op wounds clean, dry, intact or healing well Pt. Ambulating, voiding and taking PO diet without difficulty. Pt pain controlled with PO pain meds. Labs as below Complications:none  Consults:    Significant Diagnostic Studies: CBC Lab Results  Component Value Date   WBC 8.4 06/25/2017   HGB 11.8 (L) 06/25/2017   HCT 34.2 (L) 06/25/2017   MCV 95.3 06/25/2017   PLT 253 06/25/2017    BMET    Component Value Date/Time   NA 141 06/25/2017 0535   K 4.0 06/25/2017 0535   CL 110 06/25/2017 0535   CO2 27 06/25/2017 0535   GLUCOSE 120 (H) 06/25/2017 0535   BUN 10 06/25/2017 0535   CREATININE 0.84 06/25/2017 0535   CALCIUM 8.9 06/25/2017 0535   GFRNONAA >60 06/25/2017 0535   GFRAA >60 06/25/2017 0535   COAG Lab Results  Component Value Date   INR 0.97 06/17/2017      Disposition:  Discharge to :Home Discharge Instructions    Call MD for:  redness, tenderness, or signs of infection (pain, swelling, bleeding, redness, odor or green/yellow discharge around incision site)    Complete by:  As directed    Call MD for:  severe or increased pain, loss or decreased feeling  in affected limb(s)    Complete by:  As directed    Call MD for:  temperature >100.5    Complete by:  As directed    Driving Restrictions    Complete by:  As directed    No driving for one week   No dressing needed    Complete by:  As directed    Replace only if drainage present   Resume previous diet    Complete by:  As directed      Allergies as of 06/25/2017      Reactions   Penicillins Swelling   And rash   Celebrex [celecoxib] Other (See Comments)   Precipitates migraines with extended use (meloxicam also)   Clindamycin/lincomycin Nausea And Vomiting      Medication List    TAKE these medications   ALPRAZolam 0.25 MG tablet Commonly known as:  XANAX Take 0.25 mg by mouth at bedtime as needed for anxiety.   aspirin 81 MG tablet Take 81 mg by mouth daily.   aspirin-acetaminophen-caffeine 250-250-65 MG tablet Commonly known as:  EXCEDRIN MIGRAINE Take 1 tablet by mouth every 6 (six)  hours as needed for headache.   atorvastatin 20 MG tablet Commonly known as:  LIPITOR Take 10 mg by mouth daily. PM   azelastine 0.1 % nasal spray Commonly known as:  ASTELIN Place into both nostrils as needed for rhinitis. Use in each nostril as directed   calcium carbonate 600 MG tablet Commonly known as:  OS-CAL Take 600 mg by mouth daily.   citalopram 20 MG tablet Commonly known as:  CELEXA Take 20 mg by mouth daily. am   clopidogrel 75 MG tablet Commonly known as:  PLAVIX Take 1 tablet (75 mg total) by mouth daily with breakfast.   levothyroxine 50 MCG tablet Commonly known as:  SYNTHROID, LEVOTHROID Take 50 mcg by mouth daily before breakfast.   metoprolol  tartrate 25 MG tablet Commonly known as:  LOPRESSOR Take 25 mg by mouth once.   oxyCODONE-acetaminophen 5-325 MG tablet Commonly known as:  PERCOCET/ROXICET Take 1-2 tablets by mouth every 4 (four) hours as needed for moderate pain.      Verbal and written Discharge instructions given to the patient. Wound care per Discharge AVS Follow-up Information    Stegmayer, Joelene Millin A, PA-C Follow up in 3 week(s).   Specialty:  Physician Assistant Why:  with carotid duplex Contact information: [place] [address] [phone]           Signed: Leotis Pain, MD  06/25/2017, 2:43 PM

## 2017-06-25 NOTE — Progress Notes (Signed)
[facility]  Daily Progress Note   Subjective  - 1 Day Post-Op  Feels well.  Some sore throat and [name] but not bad.  Wants to go home  Objective Vitals:   06/25/17 1100 06/25/17 1200 06/25/17 1300 06/25/17 1400  BP: (!) 95/45 (!) 125/53 98/60 (!) 128/56  Pulse: (!) 58 (!) 59 (!) 56 (!) 55  Resp: 18 13 15 14   Temp:  98.2 F (36.8 C)    TempSrc:  Oral    SpO2: 96% 98% 96% 97%  Weight:      Height:        Intake/Output Summary (Last 24 hours) at 06/25/17 1440 Last data filed at 06/25/17 1200  Gross per 24 hour  Intake              300 ml  Output             2830 ml  Net            -2530 ml    PULM  CTAB CV  RRR VASC  Neck with minimal swelling. Neuro exam intact  Laboratory CBC    Component Value Date/Time   WBC 8.4 06/25/2017 0535   HGB 11.8 (L) 06/25/2017 0535   HCT 34.2 (L) 06/25/2017 0535   PLT 253 06/25/2017 0535    BMET    Component Value Date/Time   NA 141 06/25/2017 0535   K 4.0 06/25/2017 0535   CL 110 06/25/2017 0535   CO2 27 06/25/2017 0535   GLUCOSE 120 (H) 06/25/2017 0535   BUN 10 06/25/2017 0535   CREATININE 0.84 06/25/2017 0535   CALCIUM 8.9 06/25/2017 0535   GFRNONAA >60 06/25/2017 0535   GFRAA >60 06/25/2017 0535    Assessment/Planning: POD #1 s/p right CEA   Doing well  D/C home  RTC 2-3 weeks for wound check    Annette [name]  06/25/2017, 2:40 PM

## 2017-06-25 NOTE — Anesthesia Postprocedure Evaluation (Signed)
Anesthesia Post Note  Patient: Annette [name]  Procedure(s) Performed: ENDARTERECTOMY CAROTID (Right )  Patient location during evaluation: SICU Anesthesia Type: General Level of consciousness: awake and alert Pain management: pain level controlled Vital Signs Assessment: post-procedure vital signs reviewed and stable Respiratory status: spontaneous breathing, nonlabored ventilation and respiratory function stable Cardiovascular status: stable Postop Assessment: no apparent nausea or vomiting Anesthetic complications: no     Last Vitals:  Vitals:   06/25/17 0500 06/25/17 0700  BP:  (!) 125/55  Pulse:  (!) 52  Resp:  14  Temp: 36.9 C   SpO2:  99%    Last Pain:  Vitals:   06/25/17 0500  TempSrc: Oral  PainSc:                  [name]

## 2017-06-25 NOTE — Progress Notes (Signed)
Pt was explained all information related to new medications, aftercare of her surgery, pharmacy prescriptions, to the new follow up appointment and understood all information. Husband was also present.

## 2017-07-15 ENCOUNTER — Other Ambulatory Visit (INDEPENDENT_AMBULATORY_CARE_PROVIDER_SITE_OTHER): Payer: Self-pay | Admitting: Vascular Surgery

## 2017-07-15 DIAGNOSIS — I739 Peripheral vascular disease, unspecified: Secondary | ICD-10-CM

## 2017-07-21 ENCOUNTER — Ambulatory Visit (INDEPENDENT_AMBULATORY_CARE_PROVIDER_SITE_OTHER): Payer: PRIVATE HEALTH INSURANCE

## 2017-07-21 ENCOUNTER — Ambulatory Visit (INDEPENDENT_AMBULATORY_CARE_PROVIDER_SITE_OTHER): Payer: PRIVATE HEALTH INSURANCE | Admitting: Vascular Surgery

## 2017-07-21 ENCOUNTER — Encounter (INDEPENDENT_AMBULATORY_CARE_PROVIDER_SITE_OTHER): Payer: Self-pay | Admitting: Vascular Surgery

## 2017-07-21 ENCOUNTER — Other Ambulatory Visit (INDEPENDENT_AMBULATORY_CARE_PROVIDER_SITE_OTHER): Payer: Self-pay | Admitting: Vascular Surgery

## 2017-07-21 VITALS — BP 139/74 | HR 66 | Resp 17 | Ht 60.0 in | Wt 165.0 lb

## 2017-07-21 DIAGNOSIS — I6521 Occlusion and stenosis of right carotid artery: Secondary | ICD-10-CM

## 2017-07-21 DIAGNOSIS — E785 Hyperlipidemia, unspecified: Secondary | ICD-10-CM

## 2017-07-21 DIAGNOSIS — I6523 Occlusion and stenosis of bilateral carotid arteries: Secondary | ICD-10-CM

## 2017-07-21 DIAGNOSIS — Z9889 Other specified postprocedural states: Secondary | ICD-10-CM | POA: Diagnosis not present

## 2017-07-21 DIAGNOSIS — I739 Peripheral vascular disease, unspecified: Secondary | ICD-10-CM

## 2017-07-21 NOTE — Progress Notes (Signed)
Subjective:    Patient ID: Annette [name], female    DOB: Mar 18, 1956, 61 y.o.   MRN: [id number] Chief Complaint  Patient presents with  . Carotid    [facility] 3wks follow up   Patient presents for first postoperative follow-up.  The patient is status post a right carotid endarterectomy on June 24, 2017.  The patient presents today without complaint.  The patient's postoperative course has been unremarkable.  The patient underwent a bilateral carotid artery duplex exam which was notable for a patent right carotid endarterectomy with no right internal carotid artery stenosis.  Doppler velocities suggesting a 40-59% stenosis of the left proximal internal carotid artery.  When compared to the previous duplex on May 14, 2017 there has been a significant improvement in the right internal carotid artery velocities.  Patient denies any fever, nausea or vomiting.   Review of Systems  Constitutional: Negative.   HENT: Negative.   Eyes: Negative.   Respiratory: Negative.   Cardiovascular: Negative.   Gastrointestinal: Negative.   Endocrine: Negative.   Genitourinary: Negative.   Musculoskeletal: Negative.   Skin: Negative.   Allergic/Immunologic: Negative.   Neurological: Negative.   Hematological: Negative.   Psychiatric/Behavioral: Negative.       Objective:   Physical Exam  Constitutional: She is oriented to person, place, and time. She appears well-developed and well-nourished. No distress.  HENT:  Head: Normocephalic and atraumatic.  Eyes: Conjunctivae are normal. Pupils are equal, round, and reactive to light.  Neck: Normal range of motion.  Surgical incision is healing well.  No carotid bruits noted on exam.  Cardiovascular: Normal rate, regular rhythm, normal heart sounds and intact distal pulses.  Pulses:      Radial pulses are 2+ on the right side, and 2+ on the left side.  Pulmonary/Chest: Effort normal and breath sounds normal.  Musculoskeletal: Normal range of  motion. She exhibits no edema.  Neurological: She is alert and oriented to person, place, and time.  Skin: Skin is warm and dry. She is not diaphoretic.  Psychiatric: She has a normal mood and affect. Her behavior is normal. Judgment and thought content normal.  Vitals reviewed.  BP 139/74 (BP Location: Left Arm)   Pulse 66   Resp 17   Ht 5' (1.524 m)   Wt 165 lb (74.8 kg)   BMI 32.22 kg/m   Past Medical History:  Diagnosis Date  . Anxiety   . Arthritis    FINGERS, knees  . Family history of adverse reaction to anesthesia    SISTERS BOTH GET NAUSEATED  . Headache    migraines - precipitated by extended use of celebrex/meloxicam  . Hyperlipidemia   . Hypothyroidism 9/10   s/p ablation/ GRAVES DISEASE  . 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 Psoriasis    Social History   Socioeconomic History  . Marital status: Married    Spouse name: Not on file  . Number of children: Not on file  . Years of education: Not on file  . Highest education level: Not on file  Social Needs  . Financial resource strain: Not on file  . Food insecurity - worry: Not on file  . Food insecurity - inability: Not on file  . Transportation needs - medical: Not on file  . Transportation needs - non-medical: Not on file  Occupational History  . Not on file  Tobacco Use  . Smoking status: Former Smoker    Packs/day: 1.00  Years: 12.00    Pack years: 12.00    Types: Cigarettes    Last attempt to quit: 06/25/1990    Years since quitting: 27.0  . Smokeless tobacco: Never Used  Substance and Sexual Activity  . Alcohol use: Yes    Comment: rare - 1 drink/month  . Drug use: No  . Sexual activity: Not on file  Other Topics Concern  . Not on file  Social History Narrative  . Not on file   Past Surgical History:  Procedure Laterality Date  . ABDOMINAL HYSTERECTOMY  8/08  . CESAREAN SECTION     x2  . COLONOSCOPY WITH PROPOFOL N/A 10/20/2016   Procedure:  COLONOSCOPY WITH PROPOFOL;  Surgeon: Lucilla Lame, MD;  Location: [place];  Service: Endoscopy;  Laterality: N/A;  . ENDARTERECTOMY Right 06/24/2017   Procedure: ENDARTERECTOMY CAROTID;  Surgeon: Algernon Huxley, MD;  Location: [facility];  Service: Vascular;  Laterality: Right;  . KNEE ARTHROSCOPY Left 09/14/2015   Procedure: ARTHROSCOPY KNEE;  Surgeon: Leanor Kail, MD;  Location: [place];  Service: Orthopedics;  Laterality: Left;  . KNEE ARTHROSCOPY W/ PARTIAL MEDIAL MENISCECTOMY Right 05/12/14  . POLYPECTOMY  10/20/2016   Procedure: POLYPECTOMY;  Surgeon: Lucilla Lame, MD;  Location: [place];  Service: Endoscopy;;  . THYROID SURGERY  2010   ablation   Family History  Problem Relation Age of Onset  . Breast cancer Paternal Grandmother 33  . Breast cancer Maternal Grandmother 73  . Breast cancer Sister 14   Allergies  Allergen Reactions  . Penicillins Swelling    And rash  . Celebrex [Celecoxib] Other (See Comments)    Precipitates migraines with extended use (meloxicam also)  . Clindamycin/Lincomycin Nausea And Vomiting      Assessment & Plan:  Patient presents for first postoperative follow-up.  The patient is status post a right carotid endarterectomy on June 24, 2017.  The patient presents today without complaint.  The patient's postoperative course has been unremarkable.  The patient underwent a bilateral carotid artery duplex exam which was notable for a patent right carotid endarterectomy with no right internal carotid artery stenosis.  Doppler velocities suggesting a 40-59% stenosis of the left proximal internal carotid artery.  When compared to the previous duplex on May 14, 2017 there has been a significant improvement in the right internal carotid artery velocities.  Patient denies any fever, nausea or vomiting.  1. Bilateral carotid artery stenosis - Improvement Patient's postoperative course has been unremarkable Physical exam is  unremarkable Patient to follow-up in 3 months for repeat carotid duplex  - VAS US CAROTID; Future  2. Hyperlipidemia, unspecified hyperlipidemia type - Stable Encouraged good control as its slows the progression of atherosclerotic disease  Current Outpatient Medications on File Prior to Visit  Medication Sig Dispense Refill  . ALPRAZolam (XANAX) 0.25 MG tablet Take 0.25 mg by mouth at bedtime as needed for anxiety.    Marland Kitchen aspirin 81 MG tablet Take 81 mg by mouth daily.    Marland Kitchen aspirin-acetaminophen-caffeine (EXCEDRIN MIGRAINE) 250-250-65 MG tablet Take 1 tablet by mouth every 6 (six) hours as needed for headache.    Marland Kitchen atorvastatin (LIPITOR) 20 MG tablet Take 10 mg by mouth daily. PM     . azelastine (ASTELIN) 0.1 % nasal spray Place into both nostrils as needed for rhinitis. Use in each nostril as directed    . calcium carbonate (OS-CAL) 600 MG tablet Take 600 mg by mouth daily.    . citalopram (CELEXA)  20 MG tablet Take 20 mg by mouth daily. am    . clopidogrel (PLAVIX) 75 MG tablet Take 1 tablet (75 mg total) by mouth daily with breakfast. 30 tablet 3  . levothyroxine (SYNTHROID, LEVOTHROID) 50 MCG tablet Take 50 mcg by mouth daily before breakfast.    . oxyCODONE-acetaminophen (PERCOCET/ROXICET) 5-325 MG tablet Take 1-2 tablets by mouth every 4 (four) hours as needed for moderate pain. 30 tablet 0  . metoprolol tartrate (LOPRESSOR) 25 MG tablet Take 25 mg by mouth once.     No current facility-administered medications on file prior to visit.    There are no Patient Instructions on file for this visit. No Follow-up on file.  Zaul Hubers A Abou Sterkel, PA-C

## 2017-08-24 ENCOUNTER — Encounter (INDEPENDENT_AMBULATORY_CARE_PROVIDER_SITE_OTHER): Payer: Self-pay | Admitting: Vascular Surgery

## 2017-09-09 ENCOUNTER — Other Ambulatory Visit: Payer: Self-pay | Admitting: Internal Medicine

## 2017-09-09 DIAGNOSIS — Z1231 Encounter for screening mammogram for malignant neoplasm of breast: Secondary | ICD-10-CM

## 2017-09-11 ENCOUNTER — Encounter (INDEPENDENT_AMBULATORY_CARE_PROVIDER_SITE_OTHER): Payer: Self-pay | Admitting: Unknown Physician Specialty

## 2017-09-14 ENCOUNTER — Telehealth: Payer: Self-pay | Admitting: Gastroenterology

## 2017-09-14 NOTE — Telephone Encounter (Signed)
DR Bertram Millard [facility] CALLED STATING THEY FAXED A REFERRAL FOR A REPEAT COLONOSCOPY WITH DR Allen Norris. THEY FAXED IT OVER ON 09-09-17 & STATED THE PATIENT HAD NOT HEARD ANYTHING. PLEASE CALL [facility] TO CONFIRM REFERRAL  WAS RECEIVED.

## 2017-09-15 ENCOUNTER — Other Ambulatory Visit: Payer: Self-pay

## 2017-09-15 DIAGNOSIS — Z86010 Personal history of colonic polyps: Secondary | ICD-10-CM

## 2017-09-15 NOTE — Telephone Encounter (Signed)
Gastroenterology Pre-Procedure Review  Request Date: 11/06/17 Requesting Physician: Dr. Allen Norris  PATIENT REVIEW QUESTIONS: The patient responded to the following health history questions as indicated:    1. Are you having any GI issues? no 2. Do you have a personal history of Polyps? yes (1 year ago) 3. Do you have a family history of Colon Cancer or Polyps? yes (dad and sister polyps) 4. Diabetes Mellitus? no 5. Joint replacements in the past 12 months?no 6. Major health problems in the past 3 months?carotid surgery 06/24/17 pt has completed xarelto  7. Any artificial heart valves, MVP, or defibrillator?no    MEDICATIONS & ALLERGIES:    Patient reports the following regarding taking any anticoagulation/antiplatelet therapy:   Plavix, Coumadin, Eliquis, Xarelto, Lovenox, Pradaxa, Brilinta, or Effient? no Aspirin? yes (81 mg daily )  Patient confirms/reports the following medications:  Current Outpatient Medications  Medication Sig Dispense Refill  . ALPRAZolam (XANAX) 0.25 MG tablet Take 0.25 mg by mouth at bedtime as needed for anxiety.    Marland Kitchen aspirin 81 MG tablet Take 81 mg by mouth daily.    Marland Kitchen aspirin-acetaminophen-caffeine (EXCEDRIN MIGRAINE) 250-250-65 MG tablet Take 1 tablet by mouth every 6 (six) hours as needed for headache.    Marland Kitchen atorvastatin (LIPITOR) 20 MG tablet Take 10 mg by mouth daily. PM     . azelastine (ASTELIN) 0.1 % nasal spray Place into both nostrils as needed for rhinitis. Use in each nostril as directed    . calcium carbonate (OS-CAL) 600 MG tablet Take 600 mg by mouth daily.    . citalopram (CELEXA) 20 MG tablet Take 20 mg by mouth daily. am    . clopidogrel (PLAVIX) 75 MG tablet Take 1 tablet (75 mg total) by mouth daily with breakfast. 30 tablet 3  . levothyroxine (SYNTHROID, LEVOTHROID) 50 MCG tablet Take 50 mcg by mouth daily before breakfast.    . metoprolol tartrate (LOPRESSOR) 25 MG tablet Take 25 mg by mouth once.    Marland Kitchen oxyCODONE-acetaminophen  (PERCOCET/ROXICET) 5-325 MG tablet Take 1-2 tablets by mouth every 4 (four) hours as needed for moderate pain. 30 tablet 0   No current facility-administered medications for this visit.     Patient confirms/reports the following allergies:  Allergies  Allergen Reactions  . Penicillins Swelling    And rash  . Celebrex [Celecoxib] Other (See Comments)    Precipitates migraines with extended use (meloxicam also)  . Clindamycin/Lincomycin Nausea And Vomiting    No orders of the defined types were placed in this encounter.   AUTHORIZATION INFORMATION Primary Insurance: 1D#: Group #:  Secondary Insurance: 1D#: Group #:  SCHEDULE INFORMATION: Date: 11/06/17 Time: Location:[facility]

## 2017-09-18 ENCOUNTER — Telehealth: Payer: Self-pay | Admitting: Gastroenterology

## 2017-09-18 NOTE — Telephone Encounter (Signed)
Pt has rescheduled colonoscopy to 12/07/17. Left vm letting [place] know of the change.

## 2017-09-18 NOTE — Telephone Encounter (Signed)
Patient needs to r/s her procedure, please call on her cell phone.

## 2017-10-01 ENCOUNTER — Ambulatory Visit
Admission: RE | Admit: 2017-10-01 | Discharge: 2017-10-01 | Disposition: A | Discharge status: Home / Self Care | Payer: PRIVATE HEALTH INSURANCE | Source: Ambulatory Visit | Attending: Internal Medicine | Admitting: Internal Medicine

## 2017-10-01 DIAGNOSIS — Z1231 Encounter for screening mammogram for malignant neoplasm of breast: Secondary | ICD-10-CM | POA: Insufficient documentation

## 2017-10-07 ENCOUNTER — Other Ambulatory Visit: Payer: Self-pay

## 2017-10-23 ENCOUNTER — Encounter (INDEPENDENT_AMBULATORY_CARE_PROVIDER_SITE_OTHER): Payer: Self-pay | Admitting: Vascular Surgery

## 2017-10-23 ENCOUNTER — Ambulatory Visit (INDEPENDENT_AMBULATORY_CARE_PROVIDER_SITE_OTHER): Payer: PRIVATE HEALTH INSURANCE | Admitting: Vascular Surgery

## 2017-10-23 ENCOUNTER — Ambulatory Visit (INDEPENDENT_AMBULATORY_CARE_PROVIDER_SITE_OTHER): Payer: PRIVATE HEALTH INSURANCE

## 2017-10-23 VITALS — BP 155/77 | HR 64 | Resp 16 | Ht 60.0 in | Wt 170.0 lb

## 2017-10-23 DIAGNOSIS — I6523 Occlusion and stenosis of bilateral carotid arteries: Secondary | ICD-10-CM

## 2017-10-23 DIAGNOSIS — E785 Hyperlipidemia, unspecified: Secondary | ICD-10-CM | POA: Diagnosis not present

## 2017-10-23 NOTE — Progress Notes (Signed)
MRN : [id number]  Annette [name] is a 62 y.o. (1956-07-08) female who presents with chief complaint of  Chief Complaint  Patient presents with  . Carotid    3 month f/u  .  History of Present Illness: Patient returns in follow-up of carotid disease.  She is 4 months status post right carotid endarterectomy for high-grade stenosis.  She is operative complications.  Her duplex today shows a widely patent right carotid artery endarterectomy with stable 40-59% left ICA stenosis.  Current Outpatient Medications  Medication Sig Dispense Refill  . ALPRAZolam (XANAX) 0.25 MG tablet Take 0.25 mg by mouth at bedtime as needed for anxiety.    Marland Kitchen aspirin 81 MG tablet Take 81 mg by mouth daily.    Marland Kitchen atorvastatin (LIPITOR) 20 MG tablet Take 20 mg by mouth daily. PM     . calcium carbonate (OS-CAL) 600 MG tablet Take 600 mg by mouth daily.    . carbonyl iron (FEOSOL) 45 MG TABS tablet Take 45 mg by mouth.    . citalopram (CELEXA) 20 MG tablet Take 10 mg by mouth daily. am     . clopidogrel (PLAVIX) 75 MG tablet Take 1 tablet (75 mg total) by mouth daily with breakfast. 30 tablet 3  . Halobetasol Propionate (ULTRAVATE) 0.05 % LOTN APPLY TO THE AFFECTED AREA TWICE DAILY    . levothyroxine (SYNTHROID, LEVOTHROID) 50 MCG tablet Take 50 mcg by mouth daily before breakfast.    . prednisoLONE acetate (PRED FORTE) 1 % ophthalmic suspension   0  . aspirin-acetaminophen-caffeine (EXCEDRIN MIGRAINE) 250-250-65 MG tablet Take 1 tablet by mouth every 6 (six) hours as needed for headache.    . Aspirin-Calcium Carbonate (BAYER WOMENS) [phone] MG TABS Take by mouth.    Marland Kitchen azelastine (ASTELIN) 0.1 % nasal spray Place into both nostrils as needed for rhinitis. Use in each nostril as directed    . metoprolol tartrate (LOPRESSOR) 25 MG tablet Take 25 mg by mouth once.    Marland Kitchen oxyCODONE-acetaminophen (PERCOCET/ROXICET) 5-325 MG tablet Take 1-2 tablets by mouth every 4 (four) hours as needed for moderate pain. (Patient not  taking: Reported on 10/23/2017) 30 tablet 0   No current facility-administered medications for this visit.     Past Medical History:  Diagnosis Date  . Anxiety   . Arthritis    FINGERS, knees  . Family history of adverse reaction to anesthesia    SISTERS BOTH GET NAUSEATED  . Headache    migraines - precipitated by extended use of celebrex/meloxicam  . Hyperlipidemia   . Hypothyroidism 9/10   s/p ablation/ GRAVES DISEASE  . 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 Psoriasis     Past Surgical History:  Procedure Laterality Date  . ABDOMINAL HYSTERECTOMY  8/08  . CESAREAN SECTION     x2  . COLONOSCOPY WITH PROPOFOL N/A 10/20/2016   Procedure: COLONOSCOPY WITH PROPOFOL;  Surgeon: Lucilla Lame, MD;  Location: [place];  Service: Endoscopy;  Laterality: N/A;  . ENDARTERECTOMY Right 06/24/2017   Procedure: ENDARTERECTOMY CAROTID;  Surgeon: Algernon Huxley, MD;  Location: [facility];  Service: Vascular;  Laterality: Right;  . KNEE ARTHROSCOPY Left 09/14/2015   Procedure: ARTHROSCOPY KNEE;  Surgeon: Leanor Kail, MD;  Location: [place];  Service: Orthopedics;  Laterality: Left;  . KNEE ARTHROSCOPY W/ PARTIAL MEDIAL MENISCECTOMY Right 05/12/14  . POLYPECTOMY  10/20/2016   Procedure: POLYPECTOMY;  Surgeon: Lucilla Lame, MD;  Location: [facility];  Service: Endoscopy;;  . THYROID SURGERY  2010   ablation    Social History Social History   Tobacco Use  . Smoking status: Former Smoker    Packs/day: 1.00    Years: 12.00    Pack years: 12.00    Types: Cigarettes    Last attempt to quit: 06/25/1990    Years since quitting: 27.3  . Smokeless tobacco: Never Used  Substance Use Topics  . Alcohol use: Yes    Comment: rare - 1 drink/month  . Drug use: No      Family History Family History  Problem Relation Age of Onset  . Breast cancer Paternal Grandmother 82  . Breast cancer Maternal Grandmother 25  . Breast cancer  Sister 84     Allergies  Allergen Reactions  . Penicillins Swelling    And rash  . Celebrex [Celecoxib] Other (See Comments)    Precipitates migraines with extended use (meloxicam also)  . Clindamycin/Lincomycin Nausea And Vomiting      REVIEW OF SYSTEMS(Negative unless checked)  Constitutional: [] Weight loss[] Fever[] Chills Cardiac:[] Chest pain[] Chest pressure[] Palpitations [] Shortness of breath when laying flat [] Shortness of breath at rest [] Shortness of breath with exertion. Vascular: [] Pain in legs with walking[] Pain in legsat rest[] Pain in legs when laying flat [] Claudication [] Pain in feet when walking [] Pain in feet at rest [] Pain in feet when laying flat [] History of DVT [] Phlebitis [] Swelling in legs [] Varicose veins [] Non-healing ulcers Pulmonary: [] Uses home oxygen [] Productive cough[] Hemoptysis [] Wheeze [] COPD [] Asthma Neurologic: [x] Dizziness [] Blackouts [] Seizures [] History of stroke [] History of TIA[] Aphasia [] Temporary blindness[] Dysphagia [] Weaknessor numbness in arms [] Weakness or numbnessin legs Musculoskeletal: [] Arthritis [] Joint swelling [] Joint pain [] Low back pain Hematologic:[] Easy bruising[] Easy bleeding [] Hypercoagulable state [] Anemic [] Hepatitis Gastrointestinal:[] Blood in stool[] Vomiting blood[] Gastroesophageal reflux/heartburn[] Abdominal pain Genitourinary: [] Chronic kidney disease [] Difficulturination [] Frequenturination [] Burning with urination[] Hematuria Skin: [] Rashes [] Ulcers [] Wounds Psychological: [x] History of anxiety[] History of major depression.    Physical Examination  Vitals:   10/23/17 1144 10/23/17 1145  BP: 140/72 (!) 155/77  Pulse: (!) 59 64  Resp: 16   Weight: 77.1 kg (170 lb)   Height: 5' (1.524 m)    Body mass index is 33.2 kg/m. Gen:  WD/WN, NAD Head: [place]/AT, No temporalis wasting. Ear/Nose/Throat:  Hearing grossly intact, nares w/o erythema or drainage, trachea midline Eyes: Conjunctiva clear. Sclera non-icteric Neck: Supple.  No bruit or JVD.  Pulmonary:  Good air movement, equal and clear to auscultation bilaterally.  Cardiac: RRR, normal S1, S2 Vascular: right CEA incision well healed Vessel Right Left  Radial Palpable Palpable                                    Musculoskeletal: M/S 5/5 throughout.  No deformity or atrophy.  Neurologic: CN 2-12 intact. Sensation grossly intact in extremities.  Symmetrical.  Speech is fluent. Motor exam as listed above. Psychiatric: Judgment intact, Mood & affect appropriate for pt's clinical situation. Dermatologic: No rashes or ulcers noted.  No cellulitis or open wounds.      CBC Lab Results  Component Value Date   WBC 8.4 06/25/2017   HGB 11.8 (L) 06/25/2017   HCT 34.2 (L) 06/25/2017   MCV 95.3 06/25/2017   PLT 253 06/25/2017    BMET    Component Value Date/Time   NA 141 06/25/2017 0535   K 4.0 06/25/2017 0535   CL 110 06/25/2017 0535   CO2 27 06/25/2017 0535   GLUCOSE 120 (H) 06/25/2017 0535   BUN  10 06/25/2017 0535   CREATININE 0.84 06/25/2017 0535   CALCIUM 8.9 06/25/2017 0535   GFRNONAA >60 06/25/2017 0535   GFRAA >60 06/25/2017 0535   CrCl cannot be calculated (Patient's most recent lab result is older than the maximum 21 days allowed.).  COAG Lab Results  Component Value Date   INR 0.97 06/17/2017    Radiology Mm Screening Breast Tomo Bilateral  Result Date: 10/02/2017 CLINICAL DATA:  Screening. EXAM: DIGITAL SCREENING BILATERAL MAMMOGRAM WITH TOMO AND CAD COMPARISON:  Previous exam(s). ACR Breast Density Category b: There are scattered areas of fibroglandular density. FINDINGS: There are no findings suspicious for malignancy. Images were processed with CAD. IMPRESSION: No mammographic evidence of malignancy. A result letter of this screening mammogram will be mailed directly to the patient.  RECOMMENDATION: Screening mammogram in one year. (Code:SM-B-01Y) BI-RADS CATEGORY  1: Negative. Electronically Signed   By: Abelardo Diesel M.D.   On: 10/02/2017 09:04    Assessment/Plan Hyperlipidemia, unspecified lipid control important in reducing the progression of atherosclerotic disease. 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 Better after CEA   Carotid artery stenosis  Her duplex today shows a widely patent right carotid artery endarterectomy with stable 40-59% left ICA stenosis. Overall she is doing well.  She will continue her aspirin, Plavix, and statin agent.  Recheck in 6 months.    Leotis Pain, MD  10/23/2017 5:53 PM    This note was created with Dragon medical transcription system.  Any errors from dictation are purely unintentional

## 2017-10-23 NOTE — Assessment & Plan Note (Signed)
Her duplex today shows a widely patent right carotid artery endarterectomy with stable 40-59% left ICA stenosis. Overall she is doing well.  She will continue her aspirin, Plavix, and statin agent.  Recheck in 6 months.

## 2017-11-19 ENCOUNTER — Telehealth: Payer: Self-pay | Admitting: Gastroenterology

## 2017-11-19 NOTE — Telephone Encounter (Signed)
Pt left vm she needs to r/s her procedure for 04/15

## 2017-11-19 NOTE — Telephone Encounter (Signed)
Pt rescheduled colonoscopy to 01/11/18.

## 2018-01-05 ENCOUNTER — Telehealth: Payer: Self-pay | Admitting: Gastroenterology

## 2018-01-05 ENCOUNTER — Other Ambulatory Visit: Payer: Self-pay

## 2018-01-05 MED ORDER — NA SULFATE-K SULFATE-MG SULF 17.5-3.13-1.6 GM/177ML PO SOLN
1.0000 | ORAL | 0 refills | Status: DC
Start: 2018-01-05 — End: 2018-05-04

## 2018-01-05 NOTE — Telephone Encounter (Signed)
Pt left vm for G[place]  She states she received the instructions  Via mychart and it states the prescription is included but she states it is not she wants to make sure the rx was called in to [facility]. Please call

## 2018-01-05 NOTE — Telephone Encounter (Signed)
Pt advised rx was called in this morning after our conversation to [facility].

## 2018-01-07 NOTE — Discharge Instructions (Signed)
General Anesthesia, Adult, Care After °These instructions provide you with information about caring for yourself after your procedure. Your health care provider may also give you more specific instructions. Your treatment has been planned according to current medical practices, but problems sometimes occur. Call your health care provider if you have any problems or questions after your procedure. °What can I expect after the procedure? °After the procedure, it is common to have: °· Vomiting. °· A sore throat. °· Mental slowness. ° °It is common to feel: °· Nauseous. °· Cold or shivery. °· Sleepy. °· Tired. °· Sore or achy, even in parts of your body where you did not have surgery. ° °Follow these instructions at home: °For at least 24 hours after the procedure: °· Do not: °? Participate in activities where you could fall or become injured. °? Drive. °? Use heavy machinery. °? Drink alcohol. °? Take sleeping pills or medicines that cause drowsiness. °? Make important decisions or sign legal documents. °? Take care of children on your own. °· Rest. °Eating and drinking °· If you vomit, drink water, juice, or soup when you can drink without vomiting. °· Drink enough fluid to keep your urine clear or pale yellow. °· Make sure you have little or no nausea before eating solid foods. °· Follow the diet recommended by your health care provider. °General instructions °· Have a responsible adult stay with you until you are awake and alert. °· Return to your normal activities as told by your health care provider. Ask your health care provider what activities are safe for you. °· Take over-the-counter and prescription medicines only as told by your health care provider. °· If you smoke, do not smoke without supervision. °· Keep all follow-up visits as told by your health care provider. This is important. °Contact a health care provider if: °· You continue to have nausea or vomiting at home, and medicines are not helpful. °· You  cannot drink fluids or start eating again. °· You cannot urinate after 8-12 hours. °· You develop a skin rash. °· You have fever. °· You have increasing redness at the site of your procedure. °Get help right away if: °· You have difficulty breathing. °· You have chest pain. °· You have unexpected bleeding. °· You feel that you are having a life-threatening or urgent problem. °This information is not intended to replace advice given to you by your health care provider. Make sure you discuss any questions you have with your health care provider. °Document Released: 11/17/2000 Document Revised: 01/14/2016 Document Reviewed: 07/26/2015 °Elsevier Interactive Patient Education © 2018 [employer] ° °

## 2018-01-11 ENCOUNTER — Ambulatory Visit: Payer: PRIVATE HEALTH INSURANCE | Admitting: Anesthesiology

## 2018-01-11 ENCOUNTER — Ambulatory Visit
Admission: RE | Admit: 2018-01-11 | Discharge: 2018-01-11 | Disposition: A | Discharge status: Home / Self Care | Payer: PRIVATE HEALTH INSURANCE | Source: Ambulatory Visit | Attending: Gastroenterology | Admitting: Gastroenterology

## 2018-01-11 ENCOUNTER — Encounter
Admission: RE | Disposition: A | Discharge status: Home / Self Care | Payer: Self-pay | Source: Ambulatory Visit | Attending: Gastroenterology

## 2018-01-11 DIAGNOSIS — Z880 Allergy status to penicillin: Secondary | ICD-10-CM | POA: Insufficient documentation

## 2018-01-11 DIAGNOSIS — Z860100 Personal history of colon polyps, unspecified: Secondary | ICD-10-CM

## 2018-01-11 DIAGNOSIS — I2510 Atherosclerotic heart disease of native coronary artery without angina pectoris: Secondary | ICD-10-CM | POA: Insufficient documentation

## 2018-01-11 DIAGNOSIS — Z79899 Other long term (current) drug therapy: Secondary | ICD-10-CM | POA: Diagnosis not present

## 2018-01-11 DIAGNOSIS — F419 Anxiety disorder, unspecified: Secondary | ICD-10-CM | POA: Diagnosis not present

## 2018-01-11 DIAGNOSIS — D125 Benign neoplasm of sigmoid colon: Secondary | ICD-10-CM | POA: Diagnosis not present

## 2018-01-11 DIAGNOSIS — Z79890 Hormone replacement therapy (postmenopausal): Secondary | ICD-10-CM | POA: Diagnosis not present

## 2018-01-11 DIAGNOSIS — E785 Hyperlipidemia, unspecified: Secondary | ICD-10-CM | POA: Insufficient documentation

## 2018-01-11 DIAGNOSIS — Z7982 Long term (current) use of aspirin: Secondary | ICD-10-CM | POA: Insufficient documentation

## 2018-01-11 DIAGNOSIS — Z87891 Personal history of nicotine dependence: Secondary | ICD-10-CM | POA: Insufficient documentation

## 2018-01-11 DIAGNOSIS — Z881 Allergy status to other antibiotic agents status: Secondary | ICD-10-CM | POA: Diagnosis not present

## 2018-01-11 DIAGNOSIS — K635 Polyp of colon: Secondary | ICD-10-CM

## 2018-01-11 DIAGNOSIS — Z888 Allergy status to other drugs, medicaments and biological substances status: Secondary | ICD-10-CM | POA: Insufficient documentation

## 2018-01-11 DIAGNOSIS — D123 Benign neoplasm of transverse colon: Secondary | ICD-10-CM

## 2018-01-11 DIAGNOSIS — Z1211 Encounter for screening for malignant neoplasm of colon: Secondary | ICD-10-CM | POA: Diagnosis present

## 2018-01-11 DIAGNOSIS — E039 Hypothyroidism, unspecified: Secondary | ICD-10-CM | POA: Diagnosis not present

## 2018-01-11 DIAGNOSIS — D122 Benign neoplasm of ascending colon: Secondary | ICD-10-CM | POA: Insufficient documentation

## 2018-01-11 DIAGNOSIS — K621 Rectal polyp: Secondary | ICD-10-CM

## 2018-01-11 HISTORY — DX: Atherosclerotic heart disease of native coronary artery without angina pectoris: I25.10

## 2018-01-11 HISTORY — DX: Other complications of anesthesia, initial encounter: T88.59XA

## 2018-01-11 HISTORY — DX: Anemia, unspecified: D64.9

## 2018-01-11 HISTORY — DX: Adverse effect of unspecified anesthetic, initial encounter: T41.45XA

## 2018-01-11 HISTORY — PX: POLYPECTOMY: SHX5525

## 2018-01-11 HISTORY — PX: COLONOSCOPY WITH PROPOFOL: SHX5780

## 2018-01-11 SURGERY — COLONOSCOPY WITH PROPOFOL
Anesthesia: General | Wound class: Contaminated

## 2018-01-11 MED ORDER — STERILE WATER FOR IRRIGATION IR SOLN
Status: DC | PRN
  Administered 2018-01-11: .5 mL

## 2018-01-11 MED ORDER — LACTATED RINGERS IV SOLN
INTRAVENOUS | Status: DC
  Administered 2018-01-11: 08:00:00 via INTRAVENOUS

## 2018-01-11 MED ORDER — LIDOCAINE HCL (CARDIAC) PF 100 MG/5ML IV SOSY
PREFILLED_SYRINGE | INTRAVENOUS | Status: DC | PRN
  Administered 2018-01-11: 20 mg via INTRAVENOUS

## 2018-01-11 MED ORDER — SODIUM CHLORIDE 0.9 % IV SOLN: INTRAVENOUS | Status: DC

## 2018-01-11 MED ORDER — ACETAMINOPHEN 325 MG PO TABS: 325.0000 mg | ORAL_TABLET | Freq: Once | ORAL | Status: DC

## 2018-01-11 MED ORDER — LACTATED RINGERS IV SOLN
INTRAVENOUS | Status: DC
  Administered 2018-01-11: 07:00:00 via INTRAVENOUS

## 2018-01-11 MED ORDER — ACETAMINOPHEN 160 MG/5ML PO SOLN: 325.0000 mg | Freq: Once | ORAL | Status: DC

## 2018-01-11 MED ORDER — PROPOFOL 10 MG/ML IV BOLUS
INTRAVENOUS | Status: DC | PRN
  Administered 2018-01-11: 20 mg via INTRAVENOUS
  Administered 2018-01-11: 40 mg via INTRAVENOUS
  Administered 2018-01-11: 20 mg via INTRAVENOUS
  Administered 2018-01-11 (×2): 60 mg via INTRAVENOUS
  Administered 2018-01-11: 20 mg via INTRAVENOUS
  Administered 2018-01-11: 60 mg via INTRAVENOUS
  Administered 2018-01-11 (×4): 20 mg via INTRAVENOUS

## 2018-01-11 SURGICAL SUPPLY — 24 items
CANISTER SUCT 1200ML W/VALVE (MISCELLANEOUS) ×3 IMPLANT
CLIP HMST 235XBRD CATH ROT (MISCELLANEOUS) IMPLANT
CLIP RESOLUTION 360 11X235 (MISCELLANEOUS)
ELECT REM PT RETURN 9FT ADLT (ELECTROSURGICAL)
ELECTRODE REM PT RTRN 9FT ADLT (ELECTROSURGICAL) IMPLANT
FCP ESCP3.2XJMB 240X2.8X (MISCELLANEOUS)
FORCEPS BIOP RAD 4 LRG CAP 4 (CUTTING FORCEPS) ×1 IMPLANT
FORCEPS BIOP RJ4 240 W/NDL (MISCELLANEOUS)
FORCEPS ESCP3.2XJMB 240X2.8X (MISCELLANEOUS) IMPLANT
GOWN CVR UNV OPN BCK APRN NK (MISCELLANEOUS) ×4 IMPLANT
GOWN ISOL THUMB LOOP REG UNIV (MISCELLANEOUS) ×6
INJECTOR VARIJECT VIN23 (MISCELLANEOUS) IMPLANT
KIT DEFENDO VALVE AND CONN (KITS) IMPLANT
KIT ENDO PROCEDURE OLY (KITS) ×3 IMPLANT
MARKER SPOT ENDO TATTOO 5ML (MISCELLANEOUS) IMPLANT
PROBE APC STR FIRE (PROBE) IMPLANT
RETRIEVER NET ROTH 2.5X230 LF (MISCELLANEOUS) IMPLANT
SNARE SHORT THROW 13M SML OVAL (MISCELLANEOUS) ×1 IMPLANT
SNARE SHORT THROW 30M LRG OVAL (MISCELLANEOUS) IMPLANT
SNARE SNG USE RND 15MM (INSTRUMENTS) IMPLANT
SPOT EX ENDOSCOPIC TATTOO (MISCELLANEOUS)
TRAP ETRAP POLY (MISCELLANEOUS) ×1 IMPLANT
VARIJECT INJECTOR VIN23 (MISCELLANEOUS)
WATER STERILE IRR 250ML POUR (IV SOLUTION) ×3 IMPLANT

## 2018-01-11 NOTE — Op Note (Signed)
[facility] Patient Name: Annette [name] Procedure Date: 01/11/2018 7:44 AM MRN: [id number] Account #: [id number] Date of Birth: 1955/08/28 Admit Type: Outpatient Age: 62 Room: [facility] OR ROOM 01 Gender: Female Note Status: Finalized Procedure:            Colonoscopy Indications:          High risk colon cancer surveillance: Personal history                        of colonic polyps Providers:            Lucilla Lame MD, MD Referring MD:         Ramonita Lab, MD (Referring MD) Medicines:            Monitored Anesthesia Care Complications:        No immediate complications. Procedure:            Pre-Anesthesia Assessment:                       - Prior to the procedure, a History and Physical was                        performed, and patient medications and allergies were                        reviewed. The patient's tolerance of previous                        anesthesia was also reviewed. The risks and benefits of                        the procedure and the sedation options and risks were                        discussed with the patient. All questions were                        answered, and informed consent was obtained. Prior                        Anticoagulants: The patient has taken no previous                        anticoagulant or antiplatelet agents. ASA Grade                        Assessment: II - A patient with mild systemic disease.                        After reviewing the risks and benefits, the patient was                        deemed in satisfactory condition to undergo the                        procedure.                       After obtaining informed consent, the colonoscope was  passed under direct vision. Throughout the procedure,                        the patient's blood pressure, pulse, and oxygen                        saturations were monitored continuously. The Bells [id number]) was introduced through the                        anus and advanced to the the cecum, identified by                        appendiceal orifice and ileocecal valve. The                        colonoscopy was performed without difficulty. The                        patient tolerated the procedure well. The quality of                        the bowel preparation was excellent. Findings:      The perianal and digital rectal examinations were normal.      Two sessile polyps were found in the ascending colon. The polyps were 2       to 3 mm in size. These polyps were removed with a cold snare. Resection       and retrieval were complete.      Two sessile polyps were found in the sigmoid colon. The polyps were 2 to       3 mm in size. These polyps were removed with a cold biopsy forceps.       Resection and retrieval were complete.      A 3 mm polyp was found in the transverse colon. The polyp was sessile.       The polyp was removed with a cold snare. Resection and retrieval were       complete.      Two sessile polyps were found in the rectum. The polyps were 1 to 2 mm       in size. Impression:           - Two 2 to 3 mm polyps in the ascending colon, removed                        with a cold snare. Resected and retrieved.                       - Two 2 to 3 mm polyps in the sigmoid colon, removed                        with a cold biopsy forceps. Resected and retrieved.                       - One 3 mm polyp in the transverse colon, removed with  a cold snare. Resected and retrieved.                       - Two 1 to 2 mm polyps in the rectum. Recommendation:       - Discharge patient to home.                       - Resume previous diet.                       - Continue present medications.                       - Await pathology results.                       - Repeat colonoscopy in 3 years for surveillance. Procedure Code(s):    ---  Professional ---                       [phone], Colonoscopy, flexible; with removal of tumor(s),                        polyp(s), or other lesion(s) by snare technique                       45380, 26, Colonoscopy, flexible; with biopsy, single                        or multiple Diagnosis Code(s):    --- Professional ---                       Z86.010, Personal history of colonic polyps                       D12.2, Benign neoplasm of ascending colon                       D12.5, Benign neoplasm of sigmoid colon                       K62.1, Rectal polyp                       D12.3, Benign neoplasm of transverse colon (hepatic                        flexure or splenic flexure) CPT copyright 2017 American Medical Association. All rights reserved. The codes documented in this report are preliminary and upon coder review may  be revised to meet current compliance requirements. Lucilla Lame MD, MD 01/11/2018 8:11:43 AM This report has been signed electronically. Number of Addenda: 0 Note Initiated On: 01/11/2018 7:44 AM Scope Withdrawal Time: 0 hours 13 minutes 46 seconds  Total Procedure Duration: 0 hours 16 minutes 14 seconds       [facility]

## 2018-01-11 NOTE — Transfer of Care (Signed)
Immediate Anesthesia Transfer of Care Note  Patient: Annette [name]  Procedure(s) Performed: COLONOSCOPY WITH PROPOFOL (N/A ) POLYPECTOMY  Patient Location: PACU  Anesthesia Type: General  Level of Consciousness: awake, alert  and patient cooperative  Airway and Oxygen Therapy: Patient Spontanous Breathing and Patient connected to supplemental oxygen  Post-op Assessment: Post-op Vital signs reviewed, Patient's Cardiovascular Status Stable, Respiratory Function Stable, Patent Airway and No signs of Nausea or vomiting  Post-op Vital Signs: Reviewed and stable  Complications: No apparent anesthesia complications

## 2018-01-11 NOTE — H&P (Signed)
 Darren Wohl, MD FACG [address] [place], [place] [address] Phone:[phone] Fax : [phone]  Primary Care Physician:  Klein, Bert J III, MD Primary Gastroenterologist:  Dr. Wohl  Pre-Procedure History & Physical: HPI:  Andriana S Knupp is a 62 y.o. female is here for an colonoscopy.   Past Medical History:  Diagnosis Date  . Anemia    Jan slightly anemic  . Anxiety   . Arthritis    FINGERS, knees  . CAD (coronary artery disease)    had carotid endarterectomy  . Complication of anesthesia    nausea, headaches, achy legs  . Family history of adverse reaction to anesthesia    SISTERS BOTH GET NAUSEATED  . Headache 05/2017   migraines - precipitated by extended use of celebrex/meloxicam  . Hyperlipidemia   . Hypothyroidism 9/10   s/p ablation/ GRAVES DISEASE  . Neuromuscular disorder (HCC)    NUMBNESS IN HANDS OR LIPS OCCASIONALLY/ carpal tunnel  . Psoriasis     Past Surgical History:  Procedure Laterality Date  . ABDOMINAL HYSTERECTOMY  8/08  . CESAREAN SECTION     x2  . COLONOSCOPY WITH PROPOFOL N/A 10/20/2016   Procedure: COLONOSCOPY WITH PROPOFOL;  Surgeon: Darren Wohl, MD;  Location: [facility];  Service: Endoscopy;  Laterality: N/A;  . ENDARTERECTOMY Right 06/24/2017   Procedure: ENDARTERECTOMY CAROTID;  Surgeon: Dew, Jason S, MD;  Location: [facility];  Service: Vascular;  Laterality: Right;  . KNEE ARTHROSCOPY Left 09/14/2015   Procedure: ARTHROSCOPY KNEE;  Surgeon: Harold Kernodle, MD;  Location: [facility];  Service: Orthopedics;  Laterality: Left;  . KNEE ARTHROSCOPY W/ PARTIAL MEDIAL MENISCECTOMY Right 05/12/14  . POLYPECTOMY  10/20/2016   Procedure: POLYPECTOMY;  Surgeon: Darren Wohl, MD;  Location: [facility];  Service: Endoscopy;;  . THYROID SURGERY  2010   ablation    Prior to Admission medications   Medication Sig Start Date End Date Taking? Authorizing Provider  ALPRAZolam (XANAX) 0.25 MG tablet Take 0.25 mg  by mouth at bedtime as needed for anxiety.   Yes [provider]  aspirin 81 MG tablet Take 81 mg by mouth daily. Early am   Yes [provider]  aspirin-acetaminophen-caffeine (EXCEDRIN MIGRAINE) 250-250-65 MG tablet Take 1 tablet by mouth daily. Late morning   Yes [provider]  atorvastatin (LIPITOR) 20 MG tablet Take 20 mg by mouth daily. PM    Yes [provider]  azelastine (ASTELIN) 0.1 % nasal spray Place into both nostrils as needed for rhinitis. Use in each nostril as directed   Yes [provider]  calcium carbonate (OS-CAL) 600 MG tablet Take 600 mg by mouth daily.   Yes [provider]  carbonyl iron (FEOSOL) 45 MG TABS tablet Take 45 mg by mouth.   Yes [provider]  citalopram (CELEXA) 20 MG tablet Take 10 mg by mouth daily. am    Yes [provider]  Halobetasol Propionate (ULTRAVATE) 0.05 % LOTN APPLY TO THE AFFECTED AREA TWICE DAILY 12/02/16  Yes [provider]  levothyroxine (SYNTHROID, LEVOTHROID) 50 MCG tablet Take 50 mcg by mouth daily before breakfast.   Yes [provider]  Na Sulfate-K Sulfate-Mg Sulf (SUPREP BOWEL PREP KIT) 17.5-3.13-1.6 GM/177ML SOLN Take 1 kit by mouth as directed. 01/05/18  Yes Wohl, Darren, MD  clopidogrel (PLAVIX) 75 MG tablet Take 1 tablet (75 mg total) by mouth daily with breakfast. Patient not taking: Reported on 01/05/2018 06/26/17   Dew, Jason S, MD    metoprolol tartrate (LOPRESSOR) 25 MG tablet Take 25 mg by mouth once.    [provider]  oxyCODONE-acetaminophen (PERCOCET/ROXICET) 5-325 MG tablet Take 1-2 tablets by mouth every 4 (four) hours as needed for moderate pain. Patient not taking: Reported on 10/23/2017 06/25/17   Algernon Huxley, MD  prednisoLONE acetate (PRED FORTE) 1 % ophthalmic suspension  08/04/17   [provider]    Allergies as of 09/15/2017 - Review Complete 07/21/2017  Allergen Reaction Noted  . Penicillins Swelling  09/11/2015  . Celebrex [celecoxib] Other (See Comments) 09/11/2015  . Clindamycin/lincomycin Nausea And Vomiting 09/11/2015    Family History  Problem Relation Age of Onset  . Breast cancer Paternal Grandmother 75  . Breast cancer Maternal Grandmother 34  . Breast cancer Sister 53    Social History   Socioeconomic History  . Marital status: Married    Spouse name: Not on file  . Number of children: Not on file  . Years of education: Not on file  . Highest education level: Not on file  Occupational History  . Not on file  Social Needs  . Financial resource strain: Not on file  . Food insecurity:    Worry: Not on file    Inability: Not on file  . Transportation needs:    Medical: Not on file    Non-medical: Not on file  Tobacco Use  . Smoking status: Former Smoker    Packs/day: 1.00    Years: 12.00    Pack years: 12.00    Types: Cigarettes    Last attempt to quit: 06/25/1985    Years since quitting: 32.5  . Smokeless tobacco: Never Used  Substance and Sexual Activity  . Alcohol use: Yes    Comment: rare - 1 drink/month  . Drug use: No  . Sexual activity: Not on file  Lifestyle  . Physical activity:    Days per week: Not on file    Minutes per session: Not on file  . Stress: Not on file  Relationships  . Social connections:    Talks on phone: Not on file    Gets together: Not on file    Attends religious service: Not on file    Active member of club or organization: Not on file    Attends meetings of clubs or organizations: Not on file    Relationship status: Not on file  . Intimate partner violence:    Fear of current or ex partner: Not on file    Emotionally abused: Not on file    Physically abused: Not on file    Forced sexual activity: Not on file  Other Topics Concern  . Not on file  Social History Narrative  . Not on file    Review of Systems: See HPI, otherwise negative ROS  Physical Exam: BP (!) 147/72   Pulse 75   Temp 98.1 F (36.7 C)  (Temporal)   Resp 16   Ht 5' (1.524 m)   Wt 164 lb (74.4 kg)   SpO2 98%   BMI 32.03 kg/m  General:   Alert,  pleasant and cooperative in NAD Head:  Normocephalic and atraumatic. Neck:  Supple; no masses or thyromegaly. Lungs:  Clear throughout to auscultation.    Heart:  Regular rate and rhythm. Abdomen:  Soft, nontender and nondistended. Normal bowel sounds, without guarding, and without rebound.   Neurologic:  Alert and  oriented x4;  grossly normal neurologically.  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  Questions have been answered.  All parties agreeable.   Lucilla Lame, MD  01/11/2018, 7:27 AM

## 2018-01-11 NOTE — Anesthesia Postprocedure Evaluation (Signed)
Anesthesia Post Note  Patient: Annette [name]  Procedure(s) Performed: COLONOSCOPY WITH PROPOFOL (N/A ) POLYPECTOMY  Patient location during evaluation: PACU Anesthesia Type: General Level of consciousness: awake and alert and oriented Pain management: satisfactory to patient Vital Signs Assessment: post-procedure vital signs reviewed and stable Respiratory status: spontaneous breathing, nonlabored ventilation and respiratory function stable Cardiovascular status: blood pressure returned to baseline and stable Postop Assessment: Adequate PO intake and No signs of nausea or vomiting Anesthetic complications: no    Raliegh Ip

## 2018-01-11 NOTE — Anesthesia Preprocedure Evaluation (Signed)
Anesthesia Evaluation  Patient identified by MRN, date of birth, ID band Patient awake    Reviewed: Allergy & Precautions, H&P , NPO status , Patient's Chart, lab work & pertinent test results  Airway Mallampati: III  TM Distance: >3 FB Neck ROM: full    Dental no notable dental hx.    Pulmonary former smoker,    Pulmonary exam normal breath sounds clear to auscultation       Cardiovascular + CAD and + Peripheral Vascular Disease  Normal cardiovascular exam Rhythm:regular Rate:Normal     Neuro/Psych    GI/Hepatic   Endo/Other  Hypothyroidism   Renal/GU      Musculoskeletal   Abdominal   Peds  Hematology   Anesthesia Other Findings   Reproductive/Obstetrics                             Anesthesia Physical Anesthesia Plan  ASA: II  Anesthesia Plan: General   Post-op Pain Management:    Induction: Intravenous  PONV Risk Score and Plan: 3 and Propofol infusion and Treatment may vary due to age or medical condition  Airway Management Planned: Natural Airway  Additional Equipment:   Intra-op Plan:   Post-operative Plan:   Informed Consent: I have reviewed the patients History and Physical, chart, labs and discussed the procedure including the risks, benefits and alternatives for the proposed anesthesia with the patient or authorized representative who has indicated his/her understanding and acceptance.     Plan Discussed with: CRNA  Anesthesia Plan Comments:         Anesthesia Quick Evaluation

## 2018-01-11 NOTE — Anesthesia Procedure Notes (Signed)
Procedure Name: MAC Performed by: Chevy Sweigert, CRNA Pre-anesthesia Checklist: Patient identified, Emergency Drugs available, Suction available, Patient being monitored and Timeout performed Patient Re-evaluated:Patient Re-evaluated prior to induction Oxygen Delivery Method: Nasal cannula       

## 2018-01-12 ENCOUNTER — Telehealth: Payer: Self-pay | Admitting: Gastroenterology

## 2018-01-12 ENCOUNTER — Encounter: Payer: Self-pay | Admitting: Gastroenterology

## 2018-01-13 ENCOUNTER — Encounter: Payer: Self-pay | Admitting: Gastroenterology

## 2018-01-13 ENCOUNTER — Other Ambulatory Visit: Payer: Self-pay

## 2018-01-13 NOTE — Telephone Encounter (Signed)
error 

## 2018-01-15 DIAGNOSIS — E66811 Obesity, class 1: Secondary | ICD-10-CM | POA: Insufficient documentation

## 2018-01-15 DIAGNOSIS — E669 Obesity, unspecified: Secondary | ICD-10-CM | POA: Insufficient documentation

## 2018-04-30 ENCOUNTER — Encounter (INDEPENDENT_AMBULATORY_CARE_PROVIDER_SITE_OTHER): Payer: PRIVATE HEALTH INSURANCE

## 2018-04-30 ENCOUNTER — Ambulatory Visit (INDEPENDENT_AMBULATORY_CARE_PROVIDER_SITE_OTHER): Payer: PRIVATE HEALTH INSURANCE | Admitting: Vascular Surgery

## 2018-05-04 ENCOUNTER — Ambulatory Visit (INDEPENDENT_AMBULATORY_CARE_PROVIDER_SITE_OTHER): Payer: PRIVATE HEALTH INSURANCE | Admitting: Vascular Surgery

## 2018-05-04 ENCOUNTER — Ambulatory Visit (INDEPENDENT_AMBULATORY_CARE_PROVIDER_SITE_OTHER): Payer: PRIVATE HEALTH INSURANCE

## 2018-05-04 ENCOUNTER — Encounter (INDEPENDENT_AMBULATORY_CARE_PROVIDER_SITE_OTHER): Payer: Self-pay | Admitting: Vascular Surgery

## 2018-05-04 VITALS — BP 149/71 | HR 59 | Resp 16 | Ht 60.0 in | Wt 171.0 lb

## 2018-05-04 DIAGNOSIS — H93A1 Pulsatile tinnitus, right ear: Secondary | ICD-10-CM

## 2018-05-04 DIAGNOSIS — I6523 Occlusion and stenosis of bilateral carotid arteries: Secondary | ICD-10-CM

## 2018-05-04 DIAGNOSIS — I6521 Occlusion and stenosis of right carotid artery: Secondary | ICD-10-CM | POA: Diagnosis not present

## 2018-05-04 DIAGNOSIS — E785 Hyperlipidemia, unspecified: Secondary | ICD-10-CM

## 2018-05-04 NOTE — Progress Notes (Signed)
MRN : [id number]  Annette [name] is a 62 y.o. (December 21, 1955) female who presents with chief complaint of  Chief Complaint  Patient presents with  . Carotid    6 month Caorid follow up  .  History of Present Illness: Patient returns in follow-up of her carotid artery stenosis.  She is doing well.  She denies any focal neurologic symptoms.  She is almost 1 year status post right carotid endarterectomy and the pulsatile tinnitus she had prior to surgery has completely resolved.  Her duplex today shows mild intimal hyperplasia and mildly elevated velocities in the carotid endarterectomy with stable mildly elevated velocities in the 40 to 59% range in the left carotid artery without progression from previous study.  Current Outpatient Medications  Medication Sig Dispense Refill  . ALPRAZolam (XANAX) 0.25 MG tablet Take 0.25 mg by mouth at bedtime as needed for anxiety.    Marland Kitchen aspirin 81 MG tablet Take 81 mg by mouth daily. Early am    . aspirin-acetaminophen-caffeine (EXCEDRIN MIGRAINE) 250-250-65 MG tablet Take 1 tablet by mouth daily. Late morning    . atorvastatin (LIPITOR) 40 MG tablet 20 mg.   11  . azelastine (ASTELIN) 0.1 % nasal spray Place into both nostrils as needed for rhinitis. Use in each nostril as directed    . calcium carbonate (OS-CAL) 600 MG tablet Take 600 mg by mouth daily.    . citalopram (CELEXA) 20 MG tablet Take 10 mg by mouth daily. am     . Halobetasol Propionate (ULTRAVATE) 0.05 % LOTN APPLY TO THE AFFECTED AREA TWICE DAILY    . levothyroxine (SYNTHROID, LEVOTHROID) 50 MCG tablet Take 50 mcg by mouth daily before breakfast.    . metoprolol tartrate (LOPRESSOR) 25 MG tablet Take 25 mg by mouth once.    . carbonyl iron (FEOSOL) 45 MG TABS tablet Take 45 mg by mouth.    . clopidogrel (PLAVIX) 75 MG tablet Take 1 tablet (75 mg total) by mouth daily with breakfast. (Patient not taking: Reported on 05/04/2018) 30 tablet 3  . prednisoLONE acetate (PRED FORTE) 1 % ophthalmic  suspension   0   No current facility-administered medications for this visit.     Past Medical History:  Diagnosis Date  . Anemia    Jan slightly anemic  . Anxiety   . Arthritis    FINGERS, knees  . CAD (coronary artery disease)    had carotid endarterectomy  . Complication of anesthesia    nausea, headaches, achy legs  . Family history of adverse reaction to anesthesia    SISTERS BOTH GET NAUSEATED  . Headache 05/2017   migraines - precipitated by extended use of celebrex/meloxicam  . Hyperlipidemia   . Hypothyroidism 9/10   s/p ablation/ GRAVES DISEASE  . Neuromuscular disorder (HCC)    NUMBNESS IN HANDS OR LIPS OCCASIONALLY/ carpal tunnel  . Psoriasis     Past Surgical History:  Procedure Laterality Date  . ABDOMINAL HYSTERECTOMY  8/08  . CESAREAN SECTION     x2  . COLONOSCOPY WITH PROPOFOL N/A 10/20/2016   Procedure: COLONOSCOPY WITH PROPOFOL;  Surgeon: Lucilla Lame, MD;  Location: [facility];  Service: Endoscopy;  Laterality: N/A;  . COLONOSCOPY WITH PROPOFOL N/A 01/11/2018   Procedure: COLONOSCOPY WITH PROPOFOL;  Surgeon: Lucilla Lame, MD;  Location: [place];  Service: Endoscopy;  Laterality: N/A;  . ENDARTERECTOMY Right 06/24/2017   Procedure: ENDARTERECTOMY CAROTID;  Surgeon: Algernon Huxley, MD;  Location: [facility];  Service:  Vascular;  Laterality: Right;  . KNEE ARTHROSCOPY Left 09/14/2015   Procedure: ARTHROSCOPY KNEE;  Surgeon: Leanor Kail, MD;  Location: [place];  Service: Orthopedics;  Laterality: Left;  . KNEE ARTHROSCOPY W/ PARTIAL MEDIAL MENISCECTOMY Right 05/12/14  . POLYPECTOMY  10/20/2016   Procedure: POLYPECTOMY;  Surgeon: Lucilla Lame, MD;  Location: [place];  Service: Endoscopy;;  . POLYPECTOMY  01/11/2018   Procedure: POLYPECTOMY;  Surgeon: Lucilla Lame, MD;  Location: [place];  Service: Endoscopy;;  . THYROID SURGERY  2010   ablation    Social History        Tobacco Use  . Smoking status:  Former Smoker    Packs/day: 1.00    Years: 12.00    Pack years: 12.00    Types: Cigarettes    Last attempt to quit: 06/25/1990    Years since quitting: 27.3  . Smokeless tobacco: Never Used  Substance Use Topics  . Alcohol use: Yes    Comment: rare - 1 drink/month  . Drug use: No      Family History      Family History  Problem Relation Age of Onset  . Breast cancer Paternal Grandmother 86  . Breast cancer Maternal Grandmother 79  . Breast cancer Sister 44          Allergies  Allergen Reactions  . Penicillins Swelling    And rash  . Celebrex [Celecoxib] Other (See Comments)    Precipitates migraines with extended use (meloxicam also)  . Clindamycin/Lincomycin Nausea And Vomiting      REVIEW OF SYSTEMS(Negative unless checked)  Constitutional: [] Weight loss[] Fever[] Chills Cardiac:[] Chest pain[] Chest pressure[] Palpitations [] Shortness of breath when laying flat [] Shortness of breath at rest [] Shortness of breath with exertion. Vascular: [] Pain in legs with walking[] Pain in legsat rest[] Pain in legs when laying flat [] Claudication [] Pain in feet when walking [] Pain in feet at rest [] Pain in feet when laying flat [] History of DVT [] Phlebitis [] Swelling in legs [] Varicose veins [] Non-healing ulcers Pulmonary: [] Uses home oxygen [] Productive cough[] Hemoptysis [] Wheeze [] COPD [] Asthma Neurologic: [x] Dizziness [] Blackouts [] Seizures [] History of stroke [] History of TIA[] Aphasia [] Temporary blindness[] Dysphagia [] Weaknessor numbness in arms [] Weakness or numbnessin legs Musculoskeletal: [] Arthritis [] Joint swelling [] Joint pain [] Low back pain Hematologic:[] Easy bruising[] Easy bleeding [] Hypercoagulable state [] Anemic [] Hepatitis Gastrointestinal:[] Blood in stool[] Vomiting blood[] Gastroesophageal reflux/heartburn[] Abdominal pain Genitourinary:  [] Chronic kidney disease [] Difficulturination [] Frequenturination [] Burning with urination[] Hematuria Skin: [] Rashes [] Ulcers [] Wounds Psychological: [x] History of anxiety[] History of major depression.     Physical Examination  Vitals:   05/04/18 1158 05/04/18 1159  BP: (!) 151/71 (!) 149/71  Pulse: 65 (!) 59  Resp: 16   Weight: 171 lb (77.6 kg)   Height: 5' (1.524 m)    Body mass index is 33.4 kg/m. Gen:  WD/WN, NAD Head: [place]/AT, No temporalis wasting. Ear/Nose/Throat: Hearing grossly intact, nares w/o erythema or drainage, trachea midline Eyes: Conjunctiva clear. Sclera non-icteric Neck: Supple.  No bruit  Pulmonary:  Good air movement, equal and clear to auscultation bilaterally.  Cardiac: RRR, No JVD Vascular:  Vessel Right Left  Radial Palpable Palpable                                    Musculoskeletal: M/S 5/5 throughout.  No deformity or atrophy. No edema. Neurologic: CN 2-12 intact. Sensation grossly intact in extremities.  Symmetrical.  Speech is fluent. Motor exam as listed above. Psychiatric: Judgment intact, Mood & affect appropriate for pt's clinical situation. Dermatologic: No rashes or ulcers noted.  No cellulitis or open wounds. Lymph : No Cervical, Axillary, or Inguinal lymphadenopathy.     CBC Lab Results  Component Value Date   WBC 8.4 06/25/2017   HGB 11.8 (L) 06/25/2017   HCT 34.2 (L) 06/25/2017   MCV 95.3 06/25/2017   PLT 253 06/25/2017    BMET    Component Value Date/Time   NA 141 06/25/2017 0535   K 4.0 06/25/2017 0535   CL 110 06/25/2017 0535   CO2 27 06/25/2017 0535   GLUCOSE 120 (H) 06/25/2017 0535   BUN 10 06/25/2017 0535   CREATININE 0.84 06/25/2017 0535   CALCIUM 8.9 06/25/2017 0535   GFRNONAA >60 06/25/2017 0535   GFRAA >60 06/25/2017 0535   CrCl cannot be calculated (Patient's most recent lab result is older than the maximum 21 days allowed.).  COAG Lab Results  Component Value Date    INR 0.97 06/17/2017    Radiology No results found.   Assessment/Plan Hyperlipidemia, unspecified lipid control important in reducing the progression of atherosclerotic disease. 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 Better after CEA  Carotid stenosis, asymptomatic, right Her duplex today shows mild intimal hyperplasia and mildly elevated velocities in the carotid endarterectomy with stable mildly elevated velocities in the 40 to 59% range in the left carotid artery without progression from previous study. We will see her back in 1 year.  She will continue her current medical regimen.  She will contact our office with any problems in the interim    Leotis Pain, MD  05/04/2018 1:10 PM    This note was created with Dragon medical transcription system.  Any errors from dictation are purely unintentional

## 2018-05-04 NOTE — Assessment & Plan Note (Signed)
Her duplex today shows mild intimal hyperplasia and mildly elevated velocities in the carotid endarterectomy with stable mildly elevated velocities in the 40 to 59% range in the left carotid artery without progression from previous study. We will see her back in 1 year.  She will continue her current medical regimen.  She will contact our office with any problems in the interim

## 2018-05-04 NOTE — Patient Instructions (Signed)
Carotid Artery Disease The carotid arteries are arteries on both sides of the neck. They carry blood to the brain. Carotid artery disease is when the arteries get smaller (narrow) or get blocked. If these arteries get smaller or get blocked, you are more likely to have a stroke or warning stroke (transient ischemic attack). Follow these instructions at home:  Take medicines as told by your doctor. Make sure you understand all your medicine instructions. Do not stop your medicines without talking to your doctor first.  Follow your doctor's diet instructions. It is important to eat a healthy diet that includes plenty of: ? Fresh fruits. ? Vegetables. ? Lean meats.  Avoid: ? High-fat foods. ? High-sodium foods. ? Foods that are fried, overly processed, or have poor nutritional value.  Stay a healthy weight.  Stay active. Get at least 30 minutes of activity every day.  Do not smoke.  Limit alcohol use to: ? No more than 2 drinks a day for men. ? No more than 1 drink a day for women who are not pregnant.  Do not use illegal drugs.  Keep all doctor visits as told. Get help right away if:  You have sudden weakness or loss of feeling (numbness) on one side of the body, such as the face, arm, or leg.  You have sudden confusion.  You have trouble speaking (aphasia) or understanding.  You have sudden trouble seeing out of one or both eyes.  You have sudden trouble walking.  You have dizziness or feel like you might pass out (faint).  You have a loss of balance or your movements are not steady (uncoordinated).  You have a sudden, severe headache with no known cause.  You have trouble swallowing (dysphagia). Call your local emergency services (911 in [place] Do notdrive yourself to the clinic or hospital. This information is not intended to replace advice given to you by your health care provider. Make sure you discuss any questions you have with your health care  provider. Document Released: 07/28/2012 Document Revised: 01/17/2016 Document Reviewed: 02/09/2013 Elsevier Interactive Patient Education  2018 [employer]  

## 2018-06-08 ENCOUNTER — Encounter: Payer: Self-pay | Admitting: *Deleted

## 2018-06-08 ENCOUNTER — Other Ambulatory Visit: Payer: Self-pay

## 2018-06-09 ENCOUNTER — Encounter: Payer: Self-pay | Admitting: Anesthesiology

## 2018-06-11 NOTE — Discharge Instructions (Signed)

## 2018-06-16 ENCOUNTER — Ambulatory Visit: Payer: PRIVATE HEALTH INSURANCE | Admitting: Anesthesiology

## 2018-06-16 ENCOUNTER — Ambulatory Visit
Admission: RE | Admit: 2018-06-16 | Discharge: 2018-06-16 | Disposition: A | Discharge status: Home / Self Care | Payer: PRIVATE HEALTH INSURANCE | Source: Ambulatory Visit | Attending: Ophthalmology | Admitting: Ophthalmology

## 2018-06-16 ENCOUNTER — Encounter
Admission: RE | Disposition: A | Discharge status: Home / Self Care | Payer: Self-pay | Source: Ambulatory Visit | Attending: Ophthalmology

## 2018-06-16 DIAGNOSIS — Z87891 Personal history of nicotine dependence: Secondary | ICD-10-CM | POA: Diagnosis not present

## 2018-06-16 DIAGNOSIS — H2512 Age-related nuclear cataract, left eye: Secondary | ICD-10-CM | POA: Diagnosis present

## 2018-06-16 DIAGNOSIS — I2510 Atherosclerotic heart disease of native coronary artery without angina pectoris: Secondary | ICD-10-CM | POA: Insufficient documentation

## 2018-06-16 HISTORY — PX: CATARACT EXTRACTION W/PHACO: SHX586

## 2018-06-16 SURGERY — PHACOEMULSIFICATION, CATARACT, WITH IOL INSERTION
Anesthesia: Monitor Anesthesia Care | Site: Eye | Laterality: Left

## 2018-06-16 MED ORDER — MOXIFLOXACIN HCL 0.5 % OP SOLN
1.0000 [drp] | OPHTHALMIC | Status: DC | PRN
  Administered 2018-06-16 (×3): 1 [drp] via OPHTHALMIC

## 2018-06-16 MED ORDER — MOXIFLOXACIN HCL 0.5 % OP SOLN
OPHTHALMIC | Status: DC | PRN
  Administered 2018-06-16: 0.2 mL via OPHTHALMIC

## 2018-06-16 MED ORDER — ONDANSETRON HCL 4 MG/2ML IJ SOLN
INTRAMUSCULAR | Status: DC | PRN
  Administered 2018-06-16: 4 mg via INTRAVENOUS

## 2018-06-16 MED ORDER — EPINEPHRINE PF 1 MG/ML IJ SOLN
INTRAOCULAR | Status: DC | PRN
  Administered 2018-06-16: 44 mL via OPHTHALMIC

## 2018-06-16 MED ORDER — FENTANYL CITRATE (PF) 100 MCG/2ML IJ SOLN
INTRAMUSCULAR | Status: DC | PRN
  Administered 2018-06-16 (×2): 50 ug via INTRAVENOUS

## 2018-06-16 MED ORDER — LACTATED RINGERS IV SOLN: 1000.0000 mL | INTRAVENOUS | Status: DC

## 2018-06-16 MED ORDER — ARMC OPHTHALMIC DILATING DROPS
1.0000 "application " | OPHTHALMIC | Status: DC | PRN
  Administered 2018-06-16 (×3): 1 via OPHTHALMIC

## 2018-06-16 MED ORDER — BRIMONIDINE TARTRATE-TIMOLOL 0.2-0.5 % OP SOLN
OPHTHALMIC | Status: DC | PRN
  Administered 2018-06-16: 1 [drp] via OPHTHALMIC

## 2018-06-16 MED ORDER — MIDAZOLAM HCL 2 MG/2ML IJ SOLN
INTRAMUSCULAR | Status: DC | PRN
  Administered 2018-06-16: 2 mg via INTRAVENOUS

## 2018-06-16 MED ORDER — TETRACAINE HCL 0.5 % OP SOLN
1.0000 [drp] | OPHTHALMIC | Status: DC | PRN
  Administered 2018-06-16 (×2): 1 [drp] via OPHTHALMIC

## 2018-06-16 MED ORDER — LIDOCAINE HCL (PF) 2 % IJ SOLN
INTRAOCULAR | Status: DC | PRN
  Administered 2018-06-16: 1 mL

## 2018-06-16 MED ORDER — NA HYALUR & NA CHOND-NA HYALUR 0.4-0.35 ML IO KIT
PACK | INTRAOCULAR | Status: DC | PRN
  Administered 2018-06-16: 1 mL via INTRAOCULAR

## 2018-06-16 SURGICAL SUPPLY — 20 items
CANNULA ANT/CHMB 27G (MISCELLANEOUS) ×1 IMPLANT
CANNULA ANT/CHMB 27GA (MISCELLANEOUS) ×2 IMPLANT
GLOVE SURG LX 7.5 STRW (GLOVE) ×1
GLOVE SURG LX STRL 7.5 STRW (GLOVE) ×1 IMPLANT
GLOVE SURG TRIUMPH 8.0 PF LTX (GLOVE) ×2 IMPLANT
GOWN STRL REUS W/ TWL LRG LVL3 (GOWN DISPOSABLE) ×2 IMPLANT
GOWN STRL REUS W/TWL LRG LVL3 (GOWN DISPOSABLE) ×4
MARKER SKIN DUAL TIP RULER LAB (MISCELLANEOUS) ×2 IMPLANT
NDL FILTER BLUNT 18X1 1/2 (NEEDLE) ×1 IMPLANT
NEEDLE FILTER BLUNT 18X 1/2SAF (NEEDLE) ×1
NEEDLE FILTER BLUNT 18X1 1/2 (NEEDLE) ×1 IMPLANT
PACK CATARACT BRASINGTON (MISCELLANEOUS) ×2 IMPLANT
PACK EYE AFTER SURG (MISCELLANEOUS) ×2 IMPLANT
PACK OPTHALMIC (MISCELLANEOUS) ×2 IMPLANT
SYR 3ML LL SCALE MARK (SYRINGE) ×2 IMPLANT
SYR 5ML LL (SYRINGE) ×2 IMPLANT
SYR TB 1ML LUER SLIP (SYRINGE) ×2 IMPLANT
WATER STERILE IRR 500ML POUR (IV SOLUTION) ×2 IMPLANT
WIPE NON LINTING 3.25X3.25 (MISCELLANEOUS) ×2 IMPLANT
multifocal intraocular lens (Intraocular Lens) ×1 IMPLANT

## 2018-06-16 NOTE — Anesthesia Procedure Notes (Signed)
Procedure Name: MAC Performed by: Izetta Dakin, CRNA Pre-anesthesia Checklist: Patient identified, Emergency Drugs available, Suction available, Patient being monitored and Timeout performed Patient Re-evaluated:Patient Re-evaluated prior to induction Oxygen Delivery Method: Nasal cannula

## 2018-06-16 NOTE — Anesthesia Postprocedure Evaluation (Signed)
Anesthesia Post Note  Patient: Annette [name]  Procedure(s) Performed: CATARACT EXTRACTION PHACO AND INTRAOCULAR LENS PLACEMENT (IOC) LEFT (Left Eye)  Patient location during evaluation: PACU Anesthesia Type: MAC Level of consciousness: awake Pain management: pain level controlled Vital Signs Assessment: post-procedure vital signs reviewed and stable Respiratory status: spontaneous breathing Cardiovascular status: blood pressure returned to baseline Postop Assessment: no headache Anesthetic complications: no    Lavonna Monarch

## 2018-06-16 NOTE — Anesthesia Procedure Notes (Signed)
Procedure Name: MAC Performed by: Anieya Helman M, CRNA Pre-anesthesia Checklist: Patient identified, Emergency Drugs available, Suction available, Patient being monitored and Timeout performed Patient Re-evaluated:Patient Re-evaluated prior to induction Oxygen Delivery Method: Nasal cannula       

## 2018-06-16 NOTE — Anesthesia Preprocedure Evaluation (Addendum)
Anesthesia Evaluation  Patient identified by MRN, date of birth, ID band Patient awake    Reviewed: Allergy & Precautions, NPO status , Patient's Chart, lab work & pertinent test results, reviewed documented beta blocker date and time   Airway Mallampati: II  TM Distance: >3 FB Neck ROM: Full    Dental no notable dental hx.    Pulmonary former smoker,    Pulmonary exam normal breath sounds clear to auscultation       Cardiovascular + Peripheral Vascular Disease (Carotid stenosis s/p CEA)  (-) CAD (Negative stress test 2018) Normal cardiovascular exam Rhythm:Regular Rate:Normal     Neuro/Psych  Headaches, Anxiety Depression  Neuromuscular disease    GI/Hepatic negative GI ROS,   Endo/Other  Hypothyroidism   Renal/GU negative Renal ROS     Musculoskeletal  (+) Arthritis ,   Abdominal Normal abdominal exam  (+)   Peds  Hematology  (+) anemia ,   Anesthesia Other Findings   Reproductive/Obstetrics                            Anesthesia Physical Anesthesia Plan  ASA: III  Anesthesia Plan: MAC   Post-op Pain Management:    Induction: Intravenous  PONV Risk Score and Plan:   Airway Management Planned: Natural Airway  Additional Equipment: None  Intra-op Plan:   Post-operative Plan:   Informed Consent: I have reviewed the patients History and Physical, chart, labs and discussed the procedure including the risks, benefits and alternatives for the proposed anesthesia with the patient or authorized representative who has indicated his/her understanding and acceptance.     Plan Discussed with: CRNA, Anesthesiologist and Surgeon  Anesthesia Plan Comments:         Anesthesia Quick Evaluation

## 2018-06-16 NOTE — Op Note (Signed)
OPERATIVE NOTE  JAKALA HERFORD [id number] 06/16/2018   PREOPERATIVE DIAGNOSIS:  Nuclear sclerotic cataract left eye. H25.12   POSTOPERATIVE DIAGNOSIS:    Nuclear sclerotic cataract left eye.     PROCEDURE:  Phacoemusification with posterior chamber intraocular lens placement of the left eye   LENS:   Implant Name Type Inv. Item Serial No. Manufacturer Lot No. LRB No. Used  multifocal intraocular lens Intraocular Lens  [id number] [employer]  Left 1        ULTRASOUND TIME: 19  % of 0 minutes 48 seconds, CDE 9.1  SURGEON:  Wyonia Hough, MD   ANESTHESIA:  Topical with tetracaine drops and 2% Xylocaine jelly, augmented with 1% preservative-free intracameral lidocaine.    COMPLICATIONS:  None.   DESCRIPTION OF PROCEDURE:  The patient was identified in the holding room and transported to the operating room and placed in the supine position under the operating microscope.  The left eye was identified as the operative eye and it was prepped and draped in the usual sterile ophthalmic fashion.   A 1 millimeter clear-corneal paracentesis was made at the 1:30 position.  0.5 ml of preservative-free 1% lidocaine was injected into the anterior chamber.  The anterior chamber was filled with Viscoat viscoelastic.  A 2.4 millimeter keratome was used to make a near-clear corneal incision at the 10:30 position.  .  A curvilinear capsulorrhexis was made with a cystotome and capsulorrhexis forceps.  Balanced salt solution was used to hydrodissect and hydrodelineate the nucleus.   Phacoemulsification was then used in stop and chop fashion to remove the lens nucleus and epinucleus.  The remaining cortex was then removed using the irrigation and aspiration handpiece. Provisc was then placed into the capsular bag to distend it for lens placement.  A lens was then injected into the capsular bag.  The remaining viscoelastic was aspirated.   Wounds were hydrated with balanced salt solution.   The anterior chamber was inflated to a physiologic pressure with balanced salt solution.  No wound leaks were noted. Vigamox 0.2 ml of a 1mg  per ml solution was injected into the anterior chamber for a dose of 0.2 mg of intracameral antibiotic at the completion of the case.   Timolol and Brimonidine drops were applied to the eye.  The patient was taken to the recovery room in stable condition without complications of anesthesia or surgery.  Keilin Gamboa 06/16/2018, 9:55 AM

## 2018-06-16 NOTE — Transfer of Care (Signed)
Immediate Anesthesia Transfer of Care Note  Patient: Annette [name]  Procedure(s) Performed: CATARACT EXTRACTION PHACO AND INTRAOCULAR LENS PLACEMENT (IOC) LEFT (Left Eye)  Patient Location: PACU  Anesthesia Type: MAC  Level of Consciousness: awake, alert  and patient cooperative  Airway and Oxygen Therapy: Patient Spontanous Breathing and Patient connected to supplemental oxygen  Post-op Assessment: Post-op Vital signs reviewed, Patient's Cardiovascular Status Stable, Respiratory Function Stable, Patent Airway and No signs of Nausea or vomiting  Post-op Vital Signs: Reviewed and stable  Complications: No apparent anesthesia complications

## 2018-06-16 NOTE — H&P (Signed)
The History and Physical notes are on paper, have been signed, and are to be scanned. The patient remains stable and unchanged from the H&P.   Previous H&P reviewed, patient examined, and there are no changes.  Annette [name] 06/16/2018 9:04 AM

## 2018-10-11 ENCOUNTER — Other Ambulatory Visit: Payer: Self-pay | Admitting: Internal Medicine

## 2018-10-11 DIAGNOSIS — Z1231 Encounter for screening mammogram for malignant neoplasm of breast: Secondary | ICD-10-CM

## 2018-10-27 ENCOUNTER — Ambulatory Visit
Admission: RE | Admit: 2018-10-27 | Discharge: 2018-10-27 | Disposition: A | Discharge status: Home / Self Care | Payer: PRIVATE HEALTH INSURANCE | Source: Ambulatory Visit | Attending: Internal Medicine | Admitting: Internal Medicine

## 2018-10-27 DIAGNOSIS — Z1231 Encounter for screening mammogram for malignant neoplasm of breast: Secondary | ICD-10-CM

## 2019-05-06 ENCOUNTER — Encounter (INDEPENDENT_AMBULATORY_CARE_PROVIDER_SITE_OTHER): Payer: PRIVATE HEALTH INSURANCE

## 2019-05-06 ENCOUNTER — Ambulatory Visit (INDEPENDENT_AMBULATORY_CARE_PROVIDER_SITE_OTHER): Payer: PRIVATE HEALTH INSURANCE | Admitting: Vascular Surgery

## 2019-05-31 ENCOUNTER — Encounter (INDEPENDENT_AMBULATORY_CARE_PROVIDER_SITE_OTHER): Payer: Self-pay | Admitting: Vascular Surgery

## 2019-05-31 ENCOUNTER — Other Ambulatory Visit: Payer: Self-pay

## 2019-05-31 ENCOUNTER — Ambulatory Visit (INDEPENDENT_AMBULATORY_CARE_PROVIDER_SITE_OTHER): Payer: PRIVATE HEALTH INSURANCE | Admitting: Vascular Surgery

## 2019-05-31 ENCOUNTER — Ambulatory Visit (INDEPENDENT_AMBULATORY_CARE_PROVIDER_SITE_OTHER): Payer: PRIVATE HEALTH INSURANCE

## 2019-05-31 VITALS — BP 172/78 | HR 58 | Resp 12 | Ht 60.0 in | Wt 171.0 lb

## 2019-05-31 DIAGNOSIS — H93A1 Pulsatile tinnitus, right ear: Secondary | ICD-10-CM

## 2019-05-31 DIAGNOSIS — E785 Hyperlipidemia, unspecified: Secondary | ICD-10-CM

## 2019-05-31 DIAGNOSIS — I6523 Occlusion and stenosis of bilateral carotid arteries: Secondary | ICD-10-CM

## 2019-05-31 NOTE — Progress Notes (Signed)
MRN : [id number]  Annette [name] is a 63 y.o. ([birth year]) female who presents with chief complaint of  Chief Complaint  Patient presents with  . Follow-up  .  History of Present Illness: Patient returns in follow-up of her carotid disease.  She is doing well and has no complaints today.  She is about 2 years status post right carotid endarterectomy for high-grade stenosis.  The pulsatile tinnitus she was having prior to that has essentially resolved.  She has no new complaints today. Carotid duplex today reveals slightly improved velocities bilaterally following on the lower end of the 40 to 59% range on the right now on the 1 to 39% range on the left.  The mild intimal hyperplasia from her previous endarterectomy 2 years ago is stable.  Current Outpatient Medications  Medication Sig Dispense Refill  . ALPRAZolam (XANAX) 0.25 MG tablet Take 0.25 mg by mouth at bedtime as needed for anxiety.    Marland Kitchen aspirin 81 MG tablet Take 81 mg by mouth daily. Early am    . atorvastatin (LIPITOR) 40 MG tablet 20 mg.   11  . calcium carbonate (OS-CAL) 600 MG tablet Take 600 mg by mouth daily.    . citalopram (CELEXA) 20 MG tablet Take 10 mg by mouth daily. am     . levothyroxine (SYNTHROID, LEVOTHROID) 50 MCG tablet Take 50 mcg by mouth daily before breakfast.     No current facility-administered medications for this visit.     Past Medical History:  Diagnosis Date  . Anemia    Jan slightly anemic  . Anxiety   . Arthritis    FINGERS, knees  . CAD (coronary artery disease)    had carotid endarterectomy  . Complication of anesthesia    nausea, headaches, achy legs  . Family history of adverse reaction to anesthesia    SISTERS BOTH GET NAUSEATED  . Headache 05/2017   migraines - precipitated by extended use of celebrex/meloxicam  . Hyperlipidemia   . Hypothyroidism 9/10   s/p ablation/ GRAVES DISEASE  . Neuromuscular disorder (HCC)    NUMBNESS IN HANDS OR LIPS OCCASIONALLY/ carpal tunnel   . Psoriasis     Past Surgical History:  Procedure Laterality Date  . ABDOMINAL HYSTERECTOMY  8/08  . CATARACT EXTRACTION W/PHACO Left 06/16/2018   Procedure: CATARACT EXTRACTION PHACO AND INTRAOCULAR LENS PLACEMENT ([facility]) LEFT;  Surgeon: Leandrew Koyanagi, MD;  Location: [facility];  Service: Ophthalmology;  Laterality: Left;  . CESAREAN SECTION     x2  . COLONOSCOPY WITH PROPOFOL N/A 10/20/2016   Procedure: COLONOSCOPY WITH PROPOFOL;  Surgeon: Lucilla Lame, MD;  Location: [facility];  Service: Endoscopy;  Laterality: N/A;  . COLONOSCOPY WITH PROPOFOL N/A 01/11/2018   Procedure: COLONOSCOPY WITH PROPOFOL;  Surgeon: Lucilla Lame, MD;  Location: [facility];  Service: Endoscopy;  Laterality: N/A;  . ENDARTERECTOMY Right 06/24/2017   Procedure: ENDARTERECTOMY CAROTID;  Surgeon: Algernon Huxley, MD;  Location: [facility];  Service: Vascular;  Laterality: Right;  . KNEE ARTHROSCOPY Left 09/14/2015   Procedure: ARTHROSCOPY KNEE;  Surgeon: Leanor Kail, MD;  Location: [facility];  Service: Orthopedics;  Laterality: Left;  . KNEE ARTHROSCOPY W/ PARTIAL MEDIAL MENISCECTOMY Right 05/12/14  . POLYPECTOMY  10/20/2016   Procedure: POLYPECTOMY;  Surgeon: Lucilla Lame, MD;  Location: [facility];  Service: Endoscopy;;  . POLYPECTOMY  01/11/2018   Procedure: POLYPECTOMY;  Surgeon: Lucilla Lame, MD;  Location: [place];  Service: Endoscopy;;  .  THYROID SURGERY  2010   ablation    Social History Social History   Tobacco Use  . Smoking status: Former Smoker    Packs/day: 1.00    Years: 12.00    Pack years: 12.00    Types: Cigarettes    Quit date: 06/25/1985    Years since quitting: 33.9  . Smokeless tobacco: Never Used  Substance Use Topics  . Alcohol use: Yes    Comment: rare - 1 drink/month  . Drug use: No     Family History Family History  Problem Relation Age of Onset  . Breast cancer Paternal Grandmother 77  . Breast cancer Maternal  Grandmother 59  . Breast cancer Sister 60    Allergies  Allergen Reactions  . Clindamycin/Lincomycin Nausea And Vomiting    headaches  . Penicillins Swelling    And rash  . Celebrex [Celecoxib] Nausea Only and Other (See Comments)    Precipitates migraines with extended use (meloxicam also)  . Doxycycline     Other reaction(s): Headache     REVIEW OF SYSTEMS (Negative unless checked)  Constitutional: [] Weight loss  [] Fever  [] Chills Cardiac: [] Chest pain   [] Chest pressure   [] Palpitations   [] Shortness of breath when laying flat   [] Shortness of breath at rest   [] Shortness of breath with exertion. Vascular:  [] Pain in legs with walking   [] Pain in legs at rest   [] Pain in legs when laying flat   [] Claudication   [] Pain in feet when walking  [] Pain in feet at rest  [] Pain in feet when laying flat   [] History of DVT   [] Phlebitis   [] Swelling in legs   [] Varicose veins   [] Non-healing ulcers Pulmonary:   [] Uses home oxygen   [] Productive cough   [] Hemoptysis   [] Wheeze  [] COPD   [] Asthma Neurologic:  [x] Dizziness  [] Blackouts   [] Seizures   [] History of stroke   [] History of TIA  [] Aphasia   [] Temporary blindness   [] Dysphagia   [] Weakness or numbness in arms   [] Weakness or numbness in legs Musculoskeletal:  [] Arthritis   [] Joint swelling   [] Joint pain   [] Low back pain Hematologic:  [] Easy bruising  [] Easy bleeding   [] Hypercoagulable state   [] Anemic  [] Hepatitis Gastrointestinal:  [] Blood in stool   [] Vomiting blood  [] Gastroesophageal reflux/heartburn   [] Difficulty swallowing. Genitourinary:  [] Chronic kidney disease   [] Difficult urination  [] Frequent urination  [] Burning with urination   [] Blood in urine Skin:  [] Rashes   [] Ulcers   [] Wounds Psychological:  [x] History of anxiety   []  History of major depression.  Physical Examination  Vitals:   05/31/19 1110  BP: (!) 172/78  Pulse: (!) 58  Resp: 12  Weight: 171 lb (77.6 kg)  Height: 5' (1.524 m)   Body mass index is  33.4 kg/m. Gen:  WD/WN, NAD Head: [place]/AT, No temporalis wasting. Ear/Nose/Throat: Hearing grossly intact, nares w/o erythema or drainage, trachea midline Eyes: Conjunctiva clear. Sclera non-icteric Neck: Supple.  Right carotid bruit  Pulmonary:  Good air movement, equal and clear to auscultation bilaterally.  Cardiac: RRR, No JVD Vascular:  Vessel Right Left  Radial Palpable Palpable               Musculoskeletal: M/S 5/5 throughout.  No deformity or atrophy. No edema. Neurologic: CN 2-12 intact. Sensation grossly intact in extremities.  Symmetrical.  Speech is fluent. Motor exam as listed above. Psychiatric: Judgment intact, Mood & affect appropriate for pt's clinical situation. Dermatologic: No  rashes or ulcers noted.  No cellulitis or open wounds.      CBC Lab Results  Component Value Date   WBC 8.4 06/25/2017   HGB 11.8 (L) 06/25/2017   HCT 34.2 (L) 06/25/2017   MCV 95.3 06/25/2017   PLT 253 06/25/2017    BMET    Component Value Date/Time   NA 141 06/25/2017 0535   K 4.0 06/25/2017 0535   CL 110 06/25/2017 0535   CO2 27 06/25/2017 0535   GLUCOSE 120 (H) 06/25/2017 0535   BUN 10 06/25/2017 0535   CREATININE 0.84 06/25/2017 0535   CALCIUM 8.9 06/25/2017 0535   GFRNONAA >60 06/25/2017 0535   GFRAA >60 06/25/2017 0535   CrCl cannot be calculated (Patient's most recent lab result is older than the maximum 21 days allowed.).  COAG Lab Results  Component Value Date   INR 0.97 06/17/2017    Radiology No results found.   Assessment/Plan Hyperlipidemia, unspecified lipid control important in reducing the progression of atherosclerotic disease. Continue statin therapy   Pulsatile tinnitus of right ear Likely secondary to carotid disease.Better after CEA  Carotid artery stenosis Carotid duplex today reveals slightly improved velocities bilaterally following on the lower end of the 40 to 59% range on the right now on the 1 to 39% range on the left.  The  mild intimal hyperplasia from her previous endarterectomy 2 years ago is stable.  Continue to follow this on an annual basis.    Leotis Pain, MD  05/31/2019 12:05 PM    This note was created with Dragon medical transcription system.  Any errors from dictation are purely unintentional

## 2019-05-31 NOTE — Assessment & Plan Note (Signed)
Carotid duplex today reveals slightly improved velocities bilaterally following on the lower end of the 40 to 59% range on the right now on the 1 to 39% range on the left.  The mild intimal hyperplasia from her previous endarterectomy 2 years ago is stable.  Continue to follow this on an annual basis.

## 2019-07-25 ENCOUNTER — Encounter: Payer: Self-pay | Admitting: *Deleted

## 2019-07-25 ENCOUNTER — Other Ambulatory Visit
Admission: RE | Admit: 2019-07-25 | Discharge: 2019-07-25 | Disposition: A | Discharge status: Home / Self Care | Payer: PRIVATE HEALTH INSURANCE | Source: Ambulatory Visit | Attending: Ophthalmology | Admitting: Ophthalmology

## 2019-07-25 ENCOUNTER — Other Ambulatory Visit: Payer: Self-pay

## 2019-07-25 DIAGNOSIS — Z01812 Encounter for preprocedural laboratory examination: Secondary | ICD-10-CM | POA: Insufficient documentation

## 2019-07-25 DIAGNOSIS — Z20828 Contact with and (suspected) exposure to other viral communicable diseases: Secondary | ICD-10-CM | POA: Diagnosis not present

## 2019-07-26 LAB — SARS CORONAVIRUS 2 (TAT 6-24 HRS): SARS Coronavirus 2: NEGATIVE

## 2019-07-26 NOTE — Discharge Instructions (Signed)

## 2019-07-27 ENCOUNTER — Ambulatory Visit: Payer: PRIVATE HEALTH INSURANCE | Admitting: Anesthesiology

## 2019-07-27 ENCOUNTER — Encounter
Admission: RE | Disposition: A | Discharge status: Home / Self Care | Payer: Self-pay | Source: Home / Self Care | Attending: Ophthalmology

## 2019-07-27 ENCOUNTER — Ambulatory Visit
Admission: RE | Admit: 2019-07-27 | Discharge: 2019-07-27 | Disposition: A | Discharge status: Home / Self Care | Payer: PRIVATE HEALTH INSURANCE | Attending: Ophthalmology | Admitting: Ophthalmology

## 2019-07-27 ENCOUNTER — Other Ambulatory Visit: Payer: Self-pay

## 2019-07-27 DIAGNOSIS — I739 Peripheral vascular disease, unspecified: Secondary | ICD-10-CM | POA: Diagnosis not present

## 2019-07-27 DIAGNOSIS — I2510 Atherosclerotic heart disease of native coronary artery without angina pectoris: Secondary | ICD-10-CM | POA: Insufficient documentation

## 2019-07-27 DIAGNOSIS — Z87891 Personal history of nicotine dependence: Secondary | ICD-10-CM | POA: Diagnosis not present

## 2019-07-27 DIAGNOSIS — I6521 Occlusion and stenosis of right carotid artery: Secondary | ICD-10-CM | POA: Insufficient documentation

## 2019-07-27 DIAGNOSIS — H2511 Age-related nuclear cataract, right eye: Secondary | ICD-10-CM | POA: Insufficient documentation

## 2019-07-27 HISTORY — PX: CATARACT EXTRACTION W/PHACO: SHX586

## 2019-07-27 SURGERY — PHACOEMULSIFICATION, CATARACT, WITH IOL INSERTION
Anesthesia: Monitor Anesthesia Care | Site: Eye | Laterality: Right

## 2019-07-27 MED ORDER — EPINEPHRINE PF 1 MG/ML IJ SOLN
INTRAOCULAR | Status: DC | PRN
  Administered 2019-07-27: 73 mL via OPHTHALMIC

## 2019-07-27 MED ORDER — FENTANYL CITRATE (PF) 100 MCG/2ML IJ SOLN
INTRAMUSCULAR | Status: DC | PRN
  Administered 2019-07-27 (×2): 50 ug via INTRAVENOUS

## 2019-07-27 MED ORDER — MOXIFLOXACIN HCL 0.5 % OP SOLN
OPHTHALMIC | Status: DC | PRN
  Administered 2019-07-27: 0.2 mL via OPHTHALMIC

## 2019-07-27 MED ORDER — ONDANSETRON HCL 4 MG/2ML IJ SOLN: 4.0000 mg | Freq: Once | INTRAMUSCULAR | Status: DC | PRN

## 2019-07-27 MED ORDER — ARMC OPHTHALMIC DILATING DROPS
1.0000 "application " | OPHTHALMIC | Status: DC | PRN
  Administered 2019-07-27 (×3): 1 via OPHTHALMIC

## 2019-07-27 MED ORDER — MOXIFLOXACIN HCL 0.5 % OP SOLN
1.0000 [drp] | OPHTHALMIC | Status: DC | PRN
  Administered 2019-07-27 (×3): 1 [drp] via OPHTHALMIC

## 2019-07-27 MED ORDER — TETRACAINE HCL 0.5 % OP SOLN
1.0000 [drp] | OPHTHALMIC | Status: DC | PRN
  Administered 2019-07-27 (×3): 1 [drp] via OPHTHALMIC

## 2019-07-27 MED ORDER — BRIMONIDINE TARTRATE-TIMOLOL 0.2-0.5 % OP SOLN
OPHTHALMIC | Status: DC | PRN
  Administered 2019-07-27: 1 [drp] via OPHTHALMIC

## 2019-07-27 MED ORDER — LACTATED RINGERS IV SOLN: INTRAVENOUS | Status: DC

## 2019-07-27 MED ORDER — MIDAZOLAM HCL 2 MG/2ML IJ SOLN
INTRAMUSCULAR | Status: DC | PRN
  Administered 2019-07-27: 2 mg via INTRAVENOUS

## 2019-07-27 MED ORDER — LIDOCAINE HCL (PF) 2 % IJ SOLN
INTRAOCULAR | Status: DC | PRN
  Administered 2019-07-27: 2 mL

## 2019-07-27 MED ORDER — NA HYALUR & NA CHOND-NA HYALUR 0.4-0.35 ML IO KIT
PACK | INTRAOCULAR | Status: DC | PRN
  Administered 2019-07-27: 1 mL via INTRAOCULAR

## 2019-07-27 SURGICAL SUPPLY — 21 items
CANNULA ANT/CHMB 27G (MISCELLANEOUS) ×1 IMPLANT
CANNULA ANT/CHMB 27GA (MISCELLANEOUS) ×2 IMPLANT
CARTRIDGE ABBOTT (MISCELLANEOUS) ×1 IMPLANT
GLOVE SURG LX 7.5 STRW (GLOVE) ×1
GLOVE SURG LX STRL 7.5 STRW (GLOVE) ×1 IMPLANT
GLOVE SURG TRIUMPH 8.0 PF LTX (GLOVE) ×2 IMPLANT
GOWN STRL REUS W/ TWL LRG LVL3 (GOWN DISPOSABLE) ×2 IMPLANT
GOWN STRL REUS W/TWL LRG LVL3 (GOWN DISPOSABLE) ×4
LENS IOL MULTIFOCL TECNIS 23.0 ×2 IMPLANT
LENS IOL TECNIS MF 3P 23.0 IMPLANT
MARKER SKIN DUAL TIP RULER LAB (MISCELLANEOUS) ×2 IMPLANT
NDL FILTER BLUNT 18X1 1/2 (NEEDLE) IMPLANT
NEEDLE FILTER BLUNT 18X 1/2SAF (NEEDLE) ×1
NEEDLE FILTER BLUNT 18X1 1/2 (NEEDLE) ×1 IMPLANT
PACK CATARACT BRASINGTON (MISCELLANEOUS) ×2 IMPLANT
PACK EYE AFTER SURG (MISCELLANEOUS) ×2 IMPLANT
PACK OPTHALMIC (MISCELLANEOUS) ×2 IMPLANT
SYR 3ML LL SCALE MARK (SYRINGE) ×2 IMPLANT
SYR TB 1ML LUER SLIP (SYRINGE) ×2 IMPLANT
WATER STERILE IRR 500ML POUR (IV SOLUTION) ×2 IMPLANT
WIPE NON LINTING 3.25X3.25 (MISCELLANEOUS) ×2 IMPLANT

## 2019-07-27 NOTE — Transfer of Care (Signed)
Immediate Anesthesia Transfer of Care Note  Patient: Annette [name]  Procedure(s) Performed: CATARACT EXTRACTION PHACO AND INTRAOCULAR LENS PLACEMENT (IOC) RIGHT TECNIS ADD 6.17, 00:56.9, 27.1% (Right Eye)  Patient Location: PACU  Anesthesia Type: MAC  Level of Consciousness: awake, alert  and patient cooperative  Airway and Oxygen Therapy: Patient Spontanous Breathing and Patient connected to supplemental oxygen  Post-op Assessment: Post-op Vital signs reviewed, Patient's Cardiovascular Status Stable, Respiratory Function Stable, Patent Airway and No signs of Nausea or vomiting  Post-op Vital Signs: Reviewed and stable  Complications: No apparent anesthesia complications

## 2019-07-27 NOTE — Op Note (Signed)
LOCATION:  [facility]   PREOPERATIVE DIAGNOSIS:    Nuclear sclerotic cataract right eye. H25.11   POSTOPERATIVE DIAGNOSIS:  Nuclear sclerotic cataract right eye.     PROCEDURE:  Phacoemusification with posterior chamber intraocular lens placement of the right eye   ULTRASOUND TIME: Procedure(s): CATARACT EXTRACTION PHACO AND INTRAOCULAR LENS PLACEMENT (IOC) RIGHT TECNIS ADD 6.17, 00:56.9, 27.1% (Right)  LENS:   Implant Name Type Inv. Item Serial No. Manufacturer Lot No. LRB No. Used Action  LENS IOL MULTIFOCL TECNIS 23.0 - [id number]  LENS IOL MULTIFOCL TECNIS 23.0 [id number]  Right 1 Implanted         SURGEON:  Wyonia Hough, MD   ANESTHESIA:  Topical with tetracaine drops and 2% Xylocaine jelly, augmented with 1% preservative-free intracameral lidocaine.    COMPLICATIONS:  None.   DESCRIPTION OF PROCEDURE:  The patient was identified in the holding room and transported to the operating room and placed in the supine position under the operating microscope.  The right eye was identified as the operative eye and it was prepped and draped in the usual sterile ophthalmic fashion.   A 1 millimeter clear-corneal paracentesis was made at the 12:00 position.  0.5 ml of preservative-free 1% lidocaine was injected into the anterior chamber. The anterior chamber was filled with Viscoat viscoelastic.  A 2.4 millimeter keratome was used to make a near-clear corneal incision at the 9:00 position.  A curvilinear capsulorrhexis was made with a cystotome and capsulorrhexis forceps.  Balanced salt solution was used to hydrodissect and hydrodelineate the nucleus.   Phacoemulsification was then used in stop and chop fashion to remove the lens nucleus and epinucleus.  The remaining cortex was then removed using the irrigation and aspiration handpiece. Provisc was then placed into the capsular bag to distend it for lens placement.  A lens was then injected into the capsular bag.  The  remaining viscoelastic was aspirated.   Wounds were hydrated with balanced salt solution.  The anterior chamber was inflated to a physiologic pressure with balanced salt solution.  No wound leaks were noted. Vigamox 0.2 ml of a 1mg  per ml solution was injected into the anterior chamber for a dose of 0.2 mg of intracameral antibiotic at the completion of the case.   Timolol and Brimonidine drops were applied to the eye.  The patient was taken to the recovery room in stable condition without complications of anesthesia or surgery.   Glendene Wyer 07/27/2019, 12:08 PM

## 2019-07-27 NOTE — Anesthesia Preprocedure Evaluation (Signed)
Anesthesia Evaluation  Patient identified by MRN, date of birth, ID band Patient awake    Reviewed: Allergy & Precautions, NPO status   Airway Mallampati: II  TM Distance: >3 FB Neck ROM: Full    Dental   Pulmonary former smoker,    breath sounds clear to auscultation       Cardiovascular + Peripheral Vascular Disease (Carotid stenosis s/p CEA)  (-) CAD (Negative stress test 2018)  Rhythm:Regular Rate:Normal     Neuro/Psych  Headaches, Anxiety Depression  Neuromuscular disease    GI/Hepatic   Endo/Other  Hypothyroidism   Renal/GU      Musculoskeletal  (+) Arthritis ,   Abdominal   Peds  Hematology  (+) anemia ,   Anesthesia Other Findings   Reproductive/Obstetrics                             Anesthesia Physical  Anesthesia Plan  ASA: III  Anesthesia Plan: MAC   Post-op Pain Management:    Induction: Intravenous  PONV Risk Score and Plan: 2 and Midazolam and Treatment may vary due to age or medical condition  Airway Management Planned: Natural Airway  Additional Equipment:   Intra-op Plan:   Post-operative Plan:   Informed Consent: I have reviewed the patients History and Physical, chart, labs and discussed the procedure including the risks, benefits and alternatives for the proposed anesthesia with the patient or authorized representative who has indicated his/her understanding and acceptance.       Plan Discussed with: CRNA  Anesthesia Plan Comments:         Anesthesia Quick Evaluation

## 2019-07-27 NOTE — Anesthesia Procedure Notes (Signed)
Procedure Name: MAC Performed by: Izetta Dakin, CRNA Pre-anesthesia Checklist: Timeout performed, Patient being monitored, Suction available, Emergency Drugs available and Patient identified Patient Re-evaluated:Patient Re-evaluated prior to induction Oxygen Delivery Method: Nasal cannula

## 2019-07-27 NOTE — Anesthesia Postprocedure Evaluation (Signed)
Anesthesia Post Note  Patient: Annette [name]  Procedure(s) Performed: CATARACT EXTRACTION PHACO AND INTRAOCULAR LENS PLACEMENT (IOC) RIGHT TECNIS ADD 6.17, 00:56.9, 27.1% (Right Eye)     Patient location during evaluation: PACU Anesthesia Type: MAC Level of consciousness: awake and alert Pain management: pain level controlled Vital Signs Assessment: post-procedure vital signs reviewed and stable Respiratory status: spontaneous breathing, nonlabored ventilation, respiratory function stable and patient connected to nasal cannula oxygen Cardiovascular status: stable and blood pressure returned to baseline Postop Assessment: no apparent nausea or vomiting Anesthetic complications: no    Veda Canning

## 2019-07-27 NOTE — Anesthesia Procedure Notes (Signed)
Procedure Name: MAC Performed by: Ramey Schiff M, CRNA Pre-anesthesia Checklist: Timeout performed, Patient being monitored, Suction available, Patient identified and Emergency Drugs available Patient Re-evaluated:Patient Re-evaluated prior to induction Oxygen Delivery Method: Nasal cannula       

## 2019-07-27 NOTE — H&P (Signed)

## 2019-07-28 ENCOUNTER — Encounter: Payer: Self-pay | Admitting: Ophthalmology

## 2019-11-09 ENCOUNTER — Other Ambulatory Visit: Payer: Self-pay | Admitting: Internal Medicine

## 2019-11-09 DIAGNOSIS — Z1231 Encounter for screening mammogram for malignant neoplasm of breast: Secondary | ICD-10-CM

## 2019-12-19 ENCOUNTER — Other Ambulatory Visit: Payer: Self-pay | Admitting: Orthopedic Surgery

## 2019-12-19 DIAGNOSIS — M1712 Unilateral primary osteoarthritis, left knee: Secondary | ICD-10-CM

## 2019-12-26 ENCOUNTER — Ambulatory Visit
Admission: RE | Admit: 2019-12-26 | Discharge: 2019-12-26 | Disposition: A | Discharge status: Home / Self Care | Payer: Medicare Other | Source: Ambulatory Visit | Attending: Orthopedic Surgery | Admitting: Orthopedic Surgery

## 2019-12-26 ENCOUNTER — Other Ambulatory Visit: Payer: Self-pay

## 2019-12-26 DIAGNOSIS — M1712 Unilateral primary osteoarthritis, left knee: Secondary | ICD-10-CM | POA: Insufficient documentation

## 2020-01-02 ENCOUNTER — Ambulatory Visit
Admission: RE | Admit: 2020-01-02 | Discharge: 2020-01-02 | Disposition: A | Discharge status: Home / Self Care | Payer: Medicare Other | Source: Ambulatory Visit | Attending: Internal Medicine | Admitting: Internal Medicine

## 2020-01-02 DIAGNOSIS — Z1231 Encounter for screening mammogram for malignant neoplasm of breast: Secondary | ICD-10-CM | POA: Insufficient documentation

## 2020-02-17 ENCOUNTER — Other Ambulatory Visit: Admission: RE | Admit: 2020-02-17 | Payer: Medicare Other | Source: Ambulatory Visit

## 2020-02-21 ENCOUNTER — Inpatient Hospital Stay: Admission: RE | Admit: 2020-02-21 | Payer: Medicare Other | Source: Ambulatory Visit | Admitting: Orthopedic Surgery

## 2020-02-21 ENCOUNTER — Encounter: Admission: RE | Payer: Self-pay | Source: Ambulatory Visit

## 2020-02-21 SURGERY — ARTHROPLASTY, KNEE, TOTAL
Anesthesia: Choice | Site: Knee | Laterality: Left

## 2020-06-01 ENCOUNTER — Encounter (INDEPENDENT_AMBULATORY_CARE_PROVIDER_SITE_OTHER): Payer: Self-pay | Admitting: Vascular Surgery

## 2020-06-01 ENCOUNTER — Other Ambulatory Visit: Payer: Self-pay

## 2020-06-01 ENCOUNTER — Ambulatory Visit (INDEPENDENT_AMBULATORY_CARE_PROVIDER_SITE_OTHER): Payer: Medicare Other

## 2020-06-01 ENCOUNTER — Ambulatory Visit (INDEPENDENT_AMBULATORY_CARE_PROVIDER_SITE_OTHER): Payer: Medicare Other | Admitting: Vascular Surgery

## 2020-06-01 VITALS — BP 134/69 | HR 62 | Ht 60.0 in | Wt 169.0 lb

## 2020-06-01 DIAGNOSIS — I6523 Occlusion and stenosis of bilateral carotid arteries: Secondary | ICD-10-CM

## 2020-06-01 DIAGNOSIS — E785 Hyperlipidemia, unspecified: Secondary | ICD-10-CM

## 2020-06-01 DIAGNOSIS — H93A1 Pulsatile tinnitus, right ear: Secondary | ICD-10-CM

## 2020-06-01 NOTE — Assessment & Plan Note (Signed)
Carotid duplex today reveals the right carotid endarterectomy site to be widely patent and the left carotid to remain in the 1 to 39% range.  The velocities are actually improved in the right carotid today.  She is doing well.  No change in her medical regimen.  Recheck in 1 year.

## 2020-06-01 NOTE — Progress Notes (Signed)
MRN : [id number]  Annette [name] is a 64 y.o. (03-Aug-1956) female who presents with chief complaint of  Chief Complaint  Patient presents with  . Follow-up    1 yr f/U U/S   .  History of Present Illness: Patient is three years s/p right CEA for high grade stenosis and pulsatile tinnitus and dizziness.  She is doing well. No complaints today. Specifically, the patient denies amaurosis fugax, speech or swallowing difficulties, or arm or leg weakness or numbness. Carotid duplex today reveals the right carotid endarterectomy site to be widely patent and the left carotid to remain in the 1 to 39% range.  The velocities are actually improved in the right carotid today.  Current Outpatient Medications  Medication Sig Dispense Refill  . ALPRAZolam (XANAX) 0.25 MG tablet Take 0.25 mg by mouth at bedtime as needed for anxiety.    Marland Kitchen aspirin 81 MG tablet Take 81 mg by mouth daily. Early am    . aspirin-acetaminophen-caffeine (EXCEDRIN MIGRAINE) 250-250-65 MG tablet Take by mouth every 6 (six) hours as needed for headache.    Marland Kitchen atorvastatin (LIPITOR) 40 MG tablet 20 mg.   11  . calcium carbonate (OS-CAL) 600 MG tablet Take 600 mg by mouth daily.    . citalopram (CELEXA) 20 MG tablet Take 10 mg by mouth daily. am     . [facility] 12 HOUR NASAL DECONGESTANT 120 MG 12 hr tablet Take 120 mg by mouth 2 (two) times daily as needed.    Marland Kitchen levothyroxine (SYNTHROID, LEVOTHROID) 50 MCG tablet Take 50 mcg by mouth daily before breakfast.    . Multiple Vitamin (MULTIVITAMIN) tablet Take 1 tablet by mouth daily.    . traMADol (ULTRAM) 50 MG tablet Take by mouth.    . Collagen Hydrolysate POWD by Does not apply route daily. (Patient not taking: Reported on 06/01/2020)     No current facility-administered medications for this visit.    Past Medical History:  Diagnosis Date  . Anemia    Jan slightly anemic  . Anxiety   . Arthritis    FINGERS, knees  . CAD (coronary artery disease)    had carotid endarterectomy    . Complication of anesthesia    nausea, headaches, achy legs  . Family history of adverse reaction to anesthesia    SISTERS BOTH GET NAUSEATED  . Headache 05/2017   migraines - precipitated by extended use of celebrex/meloxicam  . Hyperlipidemia   . Hypothyroidism 9/10   s/p ablation/ GRAVES DISEASE  . Neuromuscular disorder (HCC)    NUMBNESS IN HANDS OR LIPS OCCASIONALLY/ carpal tunnel  . Psoriasis     Past Surgical History:  Procedure Laterality Date  . ABDOMINAL HYSTERECTOMY  8/08  . CATARACT EXTRACTION W/PHACO Left 06/16/2018   Procedure: CATARACT EXTRACTION PHACO AND INTRAOCULAR LENS PLACEMENT ([place]) LEFT;  Surgeon: Leandrew Koyanagi, MD;  Location: [place];  Service: Ophthalmology;  Laterality: Left;  . CATARACT EXTRACTION W/PHACO Right 07/27/2019   Procedure: CATARACT EXTRACTION PHACO AND INTRAOCULAR LENS PLACEMENT ([facility]) RIGHT TECNIS ADD 6.17, 00:56.9, 27.1%;  Surgeon: Leandrew Koyanagi, MD;  Location: [place];  Service: Ophthalmology;  Laterality: Right;  . CESAREAN SECTION     x2  . COLONOSCOPY WITH PROPOFOL N/A 10/20/2016   Procedure: COLONOSCOPY WITH PROPOFOL;  Surgeon: Lucilla Lame, MD;  Location: [place];  Service: Endoscopy;  Laterality: N/A;  . COLONOSCOPY WITH PROPOFOL N/A 01/11/2018   Procedure: COLONOSCOPY WITH PROPOFOL;  Surgeon: Lucilla Lame, MD;  Location:  [place];  Service: Endoscopy;  Laterality: N/A;  . ENDARTERECTOMY Right 06/24/2017   Procedure: ENDARTERECTOMY CAROTID;  Surgeon: Algernon Huxley, MD;  Location: [facility];  Service: Vascular;  Laterality: Right;  . KNEE ARTHROSCOPY Left 09/14/2015   Procedure: ARTHROSCOPY KNEE;  Surgeon: Leanor Kail, MD;  Location: [place];  Service: Orthopedics;  Laterality: Left;  . KNEE ARTHROSCOPY W/ PARTIAL MEDIAL MENISCECTOMY Right 05/12/14  . POLYPECTOMY  10/20/2016   Procedure: POLYPECTOMY;  Surgeon: Lucilla Lame, MD;  Location: [place];  Service:  Endoscopy;;  . POLYPECTOMY  01/11/2018   Procedure: POLYPECTOMY;  Surgeon: Lucilla Lame, MD;  Location: [place];  Service: Endoscopy;;  . THYROID SURGERY  2010   ablation     Social History   Tobacco Use  . Smoking status: Former Smoker    Packs/day: 1.00    Years: 12.00    Pack years: 12.00    Types: Cigarettes    Quit date: 06/25/1985    Years since quitting: 34.9  . Smokeless tobacco: Never Used  Vaping Use  . Vaping Use: Never used  Substance Use Topics  . Alcohol use: Yes    Comment: rare - 1 drink/month  . Drug use: No      Family History  Problem Relation Age of Onset  . Breast cancer Paternal Grandmother 51  . Breast cancer Maternal Grandmother 35  . Breast cancer Sister 61     Allergies  Allergen Reactions  . Clindamycin/Lincomycin Nausea And Vomiting    headaches  . Penicillins Swelling    And rash  . Celebrex [Celecoxib] Nausea Only and Other (See Comments)    Precipitates migraines with extended use (meloxicam also)  . Doxycycline     Other reaction(s): Headache     REVIEW OF SYSTEMS (Negative unless checked)  Constitutional: [] Weight loss  [] Fever  [] Chills Cardiac: [] Chest pain   [] Chest pressure   [] Palpitations   [] Shortness of breath when laying flat   [] Shortness of breath at rest   [] Shortness of breath with exertion. Vascular:  [] Pain in legs with walking   [] Pain in legs at rest   [] Pain in legs when laying flat   [] Claudication   [] Pain in feet when walking  [] Pain in feet at rest  [] Pain in feet when laying flat   [] History of DVT   [] Phlebitis   [] Swelling in legs   [] Varicose veins   [] Non-healing ulcers Pulmonary:   [] Uses home oxygen   [] Productive cough   [] Hemoptysis   [] Wheeze  [] COPD   [] Asthma Neurologic:  [x] Dizziness  [] Blackouts   [] Seizures   [] History of stroke   [] History of TIA  [] Aphasia   [] Temporary blindness   [] Dysphagia   [] Weakness or numbness in arms   [] Weakness or numbness in legs Musculoskeletal:   [] Arthritis   [] Joint swelling   [] Joint pain   [] Low back pain Hematologic:  [] Easy bruising  [] Easy bleeding   [] Hypercoagulable state   [] Anemic  [] Hepatitis Gastrointestinal:  [] Blood in stool   [] Vomiting blood  [] Gastroesophageal reflux/heartburn   [] Difficulty swallowing. Genitourinary:  [] Chronic kidney disease   [] Difficult urination  [] Frequent urination  [] Burning with urination   [] Blood in urine Skin:  [] Rashes   [] Ulcers   [] Wounds Psychological:  [x] History of anxiety   []  History of major depression.  Physical Examination  Vitals:   06/01/20 1119  BP: 134/69  Pulse: 62  Weight: 169 lb (76.7 kg)  Height: 5' (1.524 m)  Body mass index is 33.01 kg/m. Gen:  WD/WN, NAD Head: [place]/AT, No temporalis wasting. Ear/Nose/Throat: Hearing grossly intact, nares w/o erythema or drainage, trachea midline Eyes: Conjunctiva clear. Sclera non-icteric Neck: Supple.  No bruit  Pulmonary:  Good air movement, equal and clear to auscultation bilaterally.  Cardiac: RRR, No JVD Vascular:  Vessel Right Left  Radial Palpable Palpable       Musculoskeletal: M/S 5/5 throughout.  No deformity or atrophy. No edema. Neurologic: CN 2-12 intact. Sensation grossly intact in extremities.  Symmetrical.  Speech is fluent. Motor exam as listed above. Psychiatric: Judgment intact, affect is anxious Dermatologic: No rashes or ulcers noted.  No cellulitis or open wounds. Lymph : No Cervical, Axillary, or Inguinal lymphadenopathy.     CBC Lab Results  Component Value Date   WBC 8.4 06/25/2017   HGB 11.8 (L) 06/25/2017   HCT 34.2 (L) 06/25/2017   MCV 95.3 06/25/2017   PLT 253 06/25/2017    BMET    Component Value Date/Time   NA 141 06/25/2017 0535   K 4.0 06/25/2017 0535   CL 110 06/25/2017 0535   CO2 27 06/25/2017 0535   GLUCOSE 120 (H) 06/25/2017 0535   BUN 10 06/25/2017 0535   CREATININE 0.84 06/25/2017 0535   CALCIUM 8.9 06/25/2017 0535   GFRNONAA >60 06/25/2017 0535   GFRAA >60  06/25/2017 0535   CrCl cannot be calculated (Patient's most recent lab result is older than the maximum 21 days allowed.).  COAG Lab Results  Component Value Date   INR 0.97 06/17/2017    Radiology No results found.   Assessment/Plan Hyperlipidemia, unspecified lipid control important in reducing the progression of atherosclerotic disease. Continue statin therapy   Pulsatile tinnitus of right ear Likely secondary to carotid disease.Better after CEA  Carotid artery stenosis Carotid duplex today reveals the right carotid endarterectomy site to be widely patent and the left carotid to remain in the 1 to 39% range.  The velocities are actually improved in the right carotid today.  She is doing well.  No change in her medical regimen.  Recheck in 1 year.    Leotis Pain, MD  06/01/2020 12:40 PM    This note was created with Dragon medical transcription system.  Any errors from dictation are purely unintentional

## 2020-06-26 ENCOUNTER — Other Ambulatory Visit: Payer: Self-pay | Admitting: Orthopedic Surgery

## 2020-07-11 DIAGNOSIS — Z8616 Personal history of COVID-19: Secondary | ICD-10-CM

## 2020-07-11 HISTORY — DX: Personal history of COVID-19: Z86.16

## 2020-08-20 ENCOUNTER — Other Ambulatory Visit: Payer: Medicare Other

## 2020-08-28 ENCOUNTER — Other Ambulatory Visit: Payer: Medicare Other

## 2020-11-12 ENCOUNTER — Other Ambulatory Visit: Payer: Medicare Other

## 2020-11-19 ENCOUNTER — Encounter
Admission: RE | Admit: 2020-11-19 | Discharge: 2020-11-19 | Disposition: A | Discharge status: Home / Self Care | Payer: Medicare Other | Source: Ambulatory Visit | Attending: Orthopedic Surgery | Admitting: Orthopedic Surgery

## 2020-11-19 ENCOUNTER — Other Ambulatory Visit: Payer: Self-pay

## 2020-11-19 ENCOUNTER — Encounter: Payer: Self-pay | Admitting: Orthopedic Surgery

## 2020-11-19 DIAGNOSIS — Z01818 Encounter for other preprocedural examination: Secondary | ICD-10-CM | POA: Insufficient documentation

## 2020-11-19 LAB — COMPREHENSIVE METABOLIC PANEL
ALT: 26 U/L (ref 0–44)
AST: 21 U/L (ref 15–41)
Albumin: 4.1 g/dL (ref 3.5–5.0)
Alkaline Phosphatase: 71 U/L (ref 38–126)
Anion gap: 9 (ref 5–15)
BUN: 23 mg/dL (ref 8–23)
CO2: 24 mmol/L (ref 22–32)
Calcium: 9.5 mg/dL (ref 8.9–10.3)
Chloride: 106 mmol/L (ref 98–111)
Creatinine, Ser: 0.8 mg/dL (ref 0.44–1.00)
GFR, Estimated: >60 mL/min (ref >60)
Glucose, Bld: 100 mg/dL — ABNORMAL HIGH (ref 70–99)
Potassium: 4.2 mmol/L (ref 3.5–5.1)
Sodium: 139 mmol/L (ref 135–145)
Total Bilirubin: 0.7 mg/dL (ref 0.3–1.2)
Total Protein: 7.2 g/dL (ref 6.5–8.1)

## 2020-11-19 LAB — CBC WITH DIFFERENTIAL/PLATELET
Abs Immature Granulocytes: 0.01 10*3/uL (ref 0.00–0.07)
Basophils Absolute: 0 10*3/uL (ref 0.0–0.1)
Basophils Relative: 1 %
Eosinophils Absolute: 0.2 10*3/uL (ref 0.0–0.5)
Eosinophils Relative: 3 %
HCT: 41.3 % (ref 36.0–46.0)
Hemoglobin: 13.8 g/dL (ref 12.0–15.0)
Immature Granulocytes: 0 %
Lymphocytes Relative: 20 %
Lymphs Abs: 1.1 10*3/uL (ref 0.7–4.0)
MCH: 32 pg (ref 26.0–34.0)
MCHC: 33.4 g/dL (ref 30.0–36.0)
MCV: 95.8 fL (ref 80.0–100.0)
Monocytes Absolute: 0.6 10*3/uL (ref 0.1–1.0)
Monocytes Relative: 10 %
Neutro Abs: 3.7 10*3/uL (ref 1.7–7.7)
Neutrophils Relative %: 66 %
Platelets: 255 10*3/uL (ref 150–400)
RBC: 4.31 MIL/uL (ref 3.87–5.11)
RDW: 12.4 % (ref 11.5–15.5)
WBC: 5.6 10*3/uL (ref 4.0–10.5)
nRBC: 0 % (ref 0.0–0.2)

## 2020-11-19 LAB — TYPE AND SCREEN
ABO/RH(D): A POS
Antibody Screen: NEGATIVE

## 2020-11-19 LAB — URINALYSIS, ROUTINE W REFLEX MICROSCOPIC
Bilirubin Urine: NEGATIVE
Glucose, UA: NEGATIVE mg/dL
Hgb urine dipstick: NEGATIVE
Ketones, ur: NEGATIVE mg/dL
Leukocytes,Ua: NEGATIVE
Nitrite: NEGATIVE
Protein, ur: NEGATIVE mg/dL
Specific Gravity, Urine: 1.01 (ref 1.005–1.030)
pH: 5 (ref 5.0–8.0)

## 2020-11-19 LAB — SURGICAL PCR SCREEN
MRSA, PCR: NEGATIVE
Staphylococcus aureus: NEGATIVE

## 2020-11-19 NOTE — Patient Instructions (Addendum)
Your procedure is scheduled on: November 29, 2020 THURSDAY Report to the Registration Desk on the 1st floor of the [facility]. To find out your arrival time, please call [phone] between 1PM - 3PM on: November 28, 2020 [facility]  REMEMBER: Instructions that are not followed completely may result in serious medical risk, up to and including death; or upon the discretion of your surgeon and anesthesiologist your surgery may need to be rescheduled.  Do not eat food after midnight the night before surgery.  No gum chewing, lozengers or hard candies.  You may however, drink CLEAR liquids up to 2 hours before you are scheduled to arrive for your surgery. Do not drink anything within 2 hours of your scheduled arrival time.  Clear liquids include: - water  - apple juice without pulp - gatorade (not RED, PURPLE, OR BLUE) - black coffee or tea (Do NOT add milk or creamers to the coffee or tea) Do NOT drink anything that is not on this list.  Type 1 and Type 2 diabetics should only drink water.  In addition, your doctor has ordered for you to drink the provided  Ensure Pre-Surgery Clear Carbohydrate Drink  Drinking this carbohydrate drink up to two hours before surgery helps to reduce insulin resistance and improve patient outcomes. Please complete drinking 2 hours prior to scheduled arrival time.  TAKE THESE MEDICATIONS THE MORNING OF SURGERY WITH A SIP OF WATER: LEVOTHYROXINE CITALOPRAM IF NEEDED   USE FLONASE THE DAY OF SURGERY  Follow recommendations from Cardiologist, Pulmonologist or PCP regarding stopping Aspirin, Coumadin, Plavix, Eliquis, Pradaxa, or Pletal. STOP ASPIRIN 7 DAYS BEFORE SURGERY 11-21-2020  One week prior to surgery: LAST DOSE 11-21-2020 Stop Anti-inflammatories (NSAIDS) such as Advil, Aleve, Ibuprofen, Motrin, Naproxen, Naprosyn and Aspirin based products such as Excedrin, Goodys Powder, BC Powder.  USE TYLENOL IF NEEDED Stop ANY OVER THE COUNTER supplements until after  surgery.  MAY CONTINUE CAL-VIT D  No Alcohol for 24 hours before or after surgery.  No Smoking including e-cigarettes for 24 hours prior to surgery.  No chewable tobacco products for at least 6 hours prior to surgery.  No nicotine patches on the day of surgery.  Do not use any "recreational" drugs for at least a week prior to your surgery.  Please be advised that the combination of cocaine and anesthesia may have negative outcomes, up to and including death. If you test positive for cocaine, your surgery will be cancelled.  On the morning of surgery brush your teeth with toothpaste and water, you may rinse your mouth with mouthwash if you wish. Do not swallow any toothpaste or mouthwash.  Do not wear jewelry, make-up, hairpins, clips or nail polish.  Do not wear lotions, powders, or perfumes.   Do not shave body from the neck down 48 hours prior to surgery just in case you cut yourself which could leave a site for infection.  Also, freshly shaved skin may become irritated if using the CHG soap.  Contact lenses, hearing aids and dentures may not be worn into surgery.  Do not bring valuables to the hospital. [facility] is not responsible for any missing/lost belongings or valuables.   Use CHG Soap as directed on instruction sheet.  Notify your doctor if there is any change in your medical condition (cold, fever, infection).  Wear comfortable clothing (specific to your surgery type) to the hospital.  Plan for stool softeners for home use; pain medications have a tendency to cause constipation. You can  also help prevent constipation by eating foods high in fiber such as fruits and vegetables and drinking plenty of fluids as your diet allows.  After surgery, you can help prevent lung complications by doing breathing exercises.  Take deep breaths and cough every 1-2 hours. Your doctor may order a device called an Incentive Spirometer to help you take deep breaths. When coughing or  sneezing, hold a pillow firmly against your incision with both hands. This is called "splinting." Doing this helps protect your incision. It also decreases belly discomfort.  If you are being admitted to the hospital overnight,YOU [place]   If you are being discharged the day of surgery, you will not be allowed to drive home. You will need a responsible adult (18 years or older) to drive you home and stay with you that night.   Please call the [place] [facility] at [phone] if you have any questions about these instructions.  Surgery Visitation Policy:  Patients undergoing a surgery or procedure may have one family member or support person with them as long as that person is not COVID-19 positive or experiencing its symptoms.  That person may remain in the waiting area during the procedure.  Inpatient Visitation:    Visiting hours are 7 a.m. to 8 p.m. Inpatients will be allowed two visitors daily. The visitors may change each day during the patient's stay. No visitors under the age of 62. Any visitor under the age of 49 must be accompanied by an adult. The visitor must pass COVID-19 screenings, use hand sanitizer when entering and exiting the patient's room and wear a mask at all times, including in the patient's room. Patients must also wear a mask when staff or their visitor are in the room. Masking is required regardless of vaccination status.

## 2020-11-20 ENCOUNTER — Other Ambulatory Visit: Payer: Medicare Other

## 2020-11-27 ENCOUNTER — Other Ambulatory Visit
Admission: RE | Admit: 2020-11-27 | Discharge: 2020-11-27 | Disposition: A | Discharge status: Home / Self Care | Payer: Medicare Other | Source: Ambulatory Visit | Attending: Orthopedic Surgery | Admitting: Orthopedic Surgery

## 2020-11-27 ENCOUNTER — Other Ambulatory Visit: Payer: Self-pay

## 2020-11-27 DIAGNOSIS — Z20822 Contact with and (suspected) exposure to covid-19: Secondary | ICD-10-CM | POA: Insufficient documentation

## 2020-11-27 DIAGNOSIS — Z01812 Encounter for preprocedural laboratory examination: Secondary | ICD-10-CM | POA: Insufficient documentation

## 2020-11-27 LAB — SARS CORONAVIRUS 2 (TAT 6-24 HRS): SARS Coronavirus 2: NEGATIVE

## 2020-11-29 ENCOUNTER — Inpatient Hospital Stay
Admission: RE | Admit: 2020-11-29 | Discharge: 2020-12-01 | DRG: 470 | Disposition: A | Discharge status: Home Health | Payer: Medicare Other | Attending: Orthopedic Surgery | Admitting: Orthopedic Surgery

## 2020-11-29 ENCOUNTER — Inpatient Hospital Stay: Payer: Medicare Other | Admitting: Urgent Care

## 2020-11-29 ENCOUNTER — Inpatient Hospital Stay: Payer: Medicare Other

## 2020-11-29 ENCOUNTER — Other Ambulatory Visit: Payer: Self-pay

## 2020-11-29 ENCOUNTER — Encounter: Payer: Self-pay | Admitting: Orthopedic Surgery

## 2020-11-29 ENCOUNTER — Encounter
Admission: RE | Disposition: A | Discharge status: Home / Self Care | Payer: Self-pay | Source: Ambulatory Visit | Attending: Orthopedic Surgery

## 2020-11-29 DIAGNOSIS — Z79890 Hormone replacement therapy (postmenopausal): Secondary | ICD-10-CM

## 2020-11-29 DIAGNOSIS — I2510 Atherosclerotic heart disease of native coronary artery without angina pectoris: Secondary | ICD-10-CM | POA: Diagnosis present

## 2020-11-29 DIAGNOSIS — Z23 Encounter for immunization: Secondary | ICD-10-CM | POA: Diagnosis present

## 2020-11-29 DIAGNOSIS — M1712 Unilateral primary osteoarthritis, left knee: Secondary | ICD-10-CM | POA: Diagnosis present

## 2020-11-29 DIAGNOSIS — Z8249 Family history of ischemic heart disease and other diseases of the circulatory system: Secondary | ICD-10-CM

## 2020-11-29 DIAGNOSIS — M21162 Varus deformity, not elsewhere classified, left knee: Secondary | ICD-10-CM | POA: Diagnosis present

## 2020-11-29 DIAGNOSIS — F32A Depression, unspecified: Secondary | ICD-10-CM | POA: Diagnosis present

## 2020-11-29 DIAGNOSIS — G43909 Migraine, unspecified, not intractable, without status migrainosus: Secondary | ICD-10-CM | POA: Diagnosis present

## 2020-11-29 DIAGNOSIS — E890 Postprocedural hypothyroidism: Secondary | ICD-10-CM | POA: Diagnosis present

## 2020-11-29 DIAGNOSIS — Z9851 Tubal ligation status: Secondary | ICD-10-CM

## 2020-11-29 DIAGNOSIS — E785 Hyperlipidemia, unspecified: Secondary | ICD-10-CM | POA: Diagnosis present

## 2020-11-29 DIAGNOSIS — Z79899 Other long term (current) drug therapy: Secondary | ICD-10-CM

## 2020-11-29 DIAGNOSIS — F419 Anxiety disorder, unspecified: Secondary | ICD-10-CM | POA: Diagnosis present

## 2020-11-29 DIAGNOSIS — Z7982 Long term (current) use of aspirin: Secondary | ICD-10-CM | POA: Diagnosis not present

## 2020-11-29 DIAGNOSIS — Z96652 Presence of left artificial knee joint: Secondary | ICD-10-CM

## 2020-11-29 DIAGNOSIS — L409 Psoriasis, unspecified: Secondary | ICD-10-CM | POA: Diagnosis present

## 2020-11-29 DIAGNOSIS — Z8261 Family history of arthritis: Secondary | ICD-10-CM | POA: Diagnosis not present

## 2020-11-29 DIAGNOSIS — Z90710 Acquired absence of both cervix and uterus: Secondary | ICD-10-CM

## 2020-11-29 DIAGNOSIS — Z20822 Contact with and (suspected) exposure to covid-19: Secondary | ICD-10-CM | POA: Diagnosis present

## 2020-11-29 DIAGNOSIS — G8918 Other acute postprocedural pain: Secondary | ICD-10-CM

## 2020-11-29 HISTORY — PX: TOTAL KNEE ARTHROPLASTY: SHX125

## 2020-11-29 LAB — CBC
HCT: 38.1 % (ref 36.0–46.0)
Hemoglobin: 12.8 g/dL (ref 12.0–15.0)
MCH: 32.6 pg (ref 26.0–34.0)
MCHC: 33.6 g/dL (ref 30.0–36.0)
MCV: 96.9 fL (ref 80.0–100.0)
Platelets: 233 10*3/uL (ref 150–400)
RBC: 3.93 MIL/uL (ref 3.87–5.11)
RDW: 12 % (ref 11.5–15.5)
WBC: 8.5 10*3/uL (ref 4.0–10.5)
nRBC: 0 % (ref 0.0–0.2)

## 2020-11-29 LAB — CREATININE, SERUM
Creatinine, Ser: 0.7 mg/dL (ref 0.44–1.00)
GFR, Estimated: >60 mL/min (ref >60)

## 2020-11-29 SURGERY — ARTHROPLASTY, KNEE, TOTAL
Anesthesia: Spinal | Site: Knee | Laterality: Left

## 2020-11-29 MED ORDER — ACETAMINOPHEN 325 MG PO TABS: 325.0000 mg | ORAL_TABLET | Freq: Four times a day (QID) | ORAL | Status: DC | PRN

## 2020-11-29 MED ORDER — ONDANSETRON HCL 4 MG/2ML IJ SOLN: 4.0000 mg | Freq: Once | INTRAMUSCULAR | Status: AC

## 2020-11-29 MED ORDER — FLUTICASONE PROPIONATE 50 MCG/ACT NA SUSP
1.0000 | Freq: Every day | NASAL | Status: DC | PRN
  Filled 2020-11-29: qty 16

## 2020-11-29 MED ORDER — BUPIVACAINE LIPOSOME 1.3 % IJ SUSP
INTRAMUSCULAR | Status: AC
  Filled 2020-11-29: qty 20

## 2020-11-29 MED ORDER — ORAL CARE MOUTH RINSE: 15.0000 mL | Freq: Once | OROMUCOSAL | Status: AC

## 2020-11-29 MED ORDER — ALUM & MAG HYDROXIDE-SIMETH 200-200-20 MG/5ML PO SUSP: 15.0000 mL | Freq: Every evening | ORAL | Status: DC | PRN

## 2020-11-29 MED ORDER — MORPHINE SULFATE (PF) 10 MG/ML IV SOLN
INTRAVENOUS | Status: DC | PRN
  Administered 2020-11-29: 10 mg

## 2020-11-29 MED ORDER — PHENOL 1.4 % MT LIQD
1.0000 | OROMUCOSAL | Status: DC | PRN
  Filled 2020-11-29: qty 177

## 2020-11-29 MED ORDER — ALPRAZOLAM 0.25 MG PO TABS
0.2500 mg | ORAL_TABLET | Freq: Every evening | ORAL | Status: DC | PRN
  Administered 2020-11-30: 0.25 mg via ORAL
  Filled 2020-11-29: qty 1

## 2020-11-29 MED ORDER — SODIUM CHLORIDE 0.9 % IV SOLN: INTRAVENOUS | Status: DC

## 2020-11-29 MED ORDER — GLYCOPYRROLATE 0.2 MG/ML IJ SOLN
INTRAMUSCULAR | Status: AC
  Filled 2020-11-29: qty 1

## 2020-11-29 MED ORDER — MAGNESIUM HYDROXIDE 400 MG/5ML PO SUSP
30.0000 mL | Freq: Every day | ORAL | Status: DC | PRN
  Administered 2020-11-30: 30 mL via ORAL
  Filled 2020-11-29: qty 30

## 2020-11-29 MED ORDER — VANCOMYCIN HCL IN DEXTROSE 1-5 GM/200ML-% IV SOLN
1000.0000 mg | INTRAVENOUS | Status: AC
  Administered 2020-11-29: 1000 mg via INTRAVENOUS

## 2020-11-29 MED ORDER — FENTANYL CITRATE (PF) 100 MCG/2ML IJ SOLN
INTRAMUSCULAR | Status: AC
  Filled 2020-11-29: qty 2

## 2020-11-29 MED ORDER — LACTATED RINGERS IV SOLN: INTRAVENOUS | Status: DC

## 2020-11-29 MED ORDER — NEOMYCIN-POLYMYXIN B GU 40-200000 IR SOLN
Status: DC | PRN
  Administered 2020-11-29: 2 mL

## 2020-11-29 MED ORDER — CHLORHEXIDINE GLUCONATE 0.12 % MT SOLN: 15.0000 mL | Freq: Once | OROMUCOSAL | Status: AC

## 2020-11-29 MED ORDER — SURGIPHOR WOUND IRRIGATION SYSTEM - OPTIME
TOPICAL | Status: DC | PRN
  Administered 2020-11-29: 1

## 2020-11-29 MED ORDER — MENTHOL 3 MG MT LOZG
1.0000 | LOZENGE | OROMUCOSAL | Status: DC | PRN
  Filled 2020-11-29: qty 9

## 2020-11-29 MED ORDER — VANCOMYCIN HCL IN DEXTROSE 1-5 GM/200ML-% IV SOLN
INTRAVENOUS | Status: AC
  Filled 2020-11-29: qty 200

## 2020-11-29 MED ORDER — OXYCODONE HCL 5 MG/5ML PO SOLN: 5.0000 mg | Freq: Once | ORAL | Status: DC | PRN

## 2020-11-29 MED ORDER — ATORVASTATIN CALCIUM 20 MG PO TABS
40.0000 mg | ORAL_TABLET | Freq: Every day | ORAL | Status: DC
  Administered 2020-11-29 – 2020-11-30 (×2): 40 mg via ORAL
  Filled 2020-11-29 (×2): qty 2

## 2020-11-29 MED ORDER — PHENYLEPHRINE HCL (PRESSORS) 10 MG/ML IV SOLN
INTRAVENOUS | Status: DC | PRN
  Administered 2020-11-29 (×3): 100 ug via INTRAVENOUS

## 2020-11-29 MED ORDER — PROPOFOL 10 MG/ML IV BOLUS
INTRAVENOUS | Status: DC | PRN
  Administered 2020-11-29: 20 mg via INTRAVENOUS

## 2020-11-29 MED ORDER — DOCUSATE SODIUM 100 MG PO CAPS
100.0000 mg | ORAL_CAPSULE | Freq: Two times a day (BID) | ORAL | Status: DC
  Administered 2020-11-29 – 2020-12-01 (×5): 100 mg via ORAL
  Filled 2020-11-29 (×5): qty 1

## 2020-11-29 MED ORDER — PNEUMOCOCCAL VAC POLYVALENT 25 MCG/0.5ML IJ INJ
0.5000 mL | INJECTION | INTRAMUSCULAR | Status: AC
  Administered 2020-11-30: 0.5 mL via INTRAMUSCULAR
  Filled 2020-11-29: qty 0.5

## 2020-11-29 MED ORDER — MIDAZOLAM HCL 2 MG/2ML IJ SOLN
INTRAMUSCULAR | Status: AC
  Filled 2020-11-29: qty 2

## 2020-11-29 MED ORDER — ALUM & MAG HYDROXIDE-SIMETH 200-200-20 MG/5ML PO SUSP: 30.0000 mL | ORAL | Status: DC | PRN

## 2020-11-29 MED ORDER — HYDROMORPHONE HCL 1 MG/ML IJ SOLN
0.5000 mg | INTRAMUSCULAR | Status: DC | PRN
  Administered 2020-11-29 – 2020-11-30 (×4): 1 mg via INTRAVENOUS
  Filled 2020-11-29 (×4): qty 1

## 2020-11-29 MED ORDER — DEXMEDETOMIDINE (PRECEDEX) IN NS 20 MCG/5ML (4 MCG/ML) IV SYRINGE
PREFILLED_SYRINGE | INTRAVENOUS | Status: AC
  Filled 2020-11-29: qty 5

## 2020-11-29 MED ORDER — ZOLPIDEM TARTRATE 5 MG PO TABS: 5.0000 mg | ORAL_TABLET | Freq: Every evening | ORAL | Status: DC | PRN

## 2020-11-29 MED ORDER — SODIUM CHLORIDE 0.9 % IV SOLN
INTRAVENOUS | Status: DC | PRN
  Administered 2020-11-29: 60 mL

## 2020-11-29 MED ORDER — FENTANYL CITRATE (PF) 100 MCG/2ML IJ SOLN
INTRAMUSCULAR | Status: DC | PRN
  Administered 2020-11-29 (×2): 50 ug via INTRAVENOUS

## 2020-11-29 MED ORDER — ASPIRIN EC 81 MG PO TBEC
81.0000 mg | DELAYED_RELEASE_TABLET | Freq: Every day | ORAL | Status: DC
  Administered 2020-11-30 – 2020-12-01 (×2): 81 mg via ORAL
  Filled 2020-11-29 (×2): qty 1

## 2020-11-29 MED ORDER — DEXMEDETOMIDINE (PRECEDEX) IN NS 20 MCG/5ML (4 MCG/ML) IV SYRINGE
PREFILLED_SYRINGE | INTRAVENOUS | Status: DC | PRN
  Administered 2020-11-29 (×2): 8 ug via INTRAVENOUS

## 2020-11-29 MED ORDER — CITALOPRAM HYDROBROMIDE 10 MG PO TABS
10.0000 mg | ORAL_TABLET | Freq: Every day | ORAL | Status: DC
  Administered 2020-11-30 – 2020-12-01 (×2): 10 mg via ORAL
  Filled 2020-11-29 (×3): qty 1

## 2020-11-29 MED ORDER — GLYCOPYRROLATE 0.2 MG/ML IJ SOLN
INTRAMUSCULAR | Status: DC | PRN
  Administered 2020-11-29: .2 mg via INTRAVENOUS

## 2020-11-29 MED ORDER — BUPIVACAINE HCL (PF) 0.5 % IJ SOLN
INTRAMUSCULAR | Status: DC | PRN
  Administered 2020-11-29: 2.2 mL via INTRATHECAL

## 2020-11-29 MED ORDER — TRAMADOL HCL 50 MG PO TABS
50.0000 mg | ORAL_TABLET | Freq: Four times a day (QID) | ORAL | Status: DC
  Administered 2020-11-29 – 2020-12-01 (×9): 50 mg via ORAL
  Filled 2020-11-29 (×9): qty 1

## 2020-11-29 MED ORDER — MORPHINE SULFATE (PF) 10 MG/ML IV SOLN
INTRAVENOUS | Status: AC
  Filled 2020-11-29: qty 1

## 2020-11-29 MED ORDER — ONDANSETRON HCL 4 MG/2ML IJ SOLN
4.0000 mg | Freq: Four times a day (QID) | INTRAMUSCULAR | Status: DC | PRN
  Administered 2020-11-29 – 2020-11-30 (×2): 4 mg via INTRAVENOUS
  Filled 2020-11-29 (×2): qty 2

## 2020-11-29 MED ORDER — SODIUM CHLORIDE FLUSH 0.9 % IV SOLN
INTRAVENOUS | Status: AC
  Filled 2020-11-29: qty 10

## 2020-11-29 MED ORDER — MAGNESIUM CITRATE PO SOLN
1.0000 | Freq: Once | ORAL | Status: DC | PRN
  Filled 2020-11-29: qty 296

## 2020-11-29 MED ORDER — ENOXAPARIN SODIUM 30 MG/0.3ML ~~LOC~~ SOLN
30.0000 mg | Freq: Two times a day (BID) | SUBCUTANEOUS | Status: DC
  Administered 2020-11-30 – 2020-12-01 (×3): 30 mg via SUBCUTANEOUS
  Filled 2020-11-29 (×3): qty 0.3

## 2020-11-29 MED ORDER — METHOCARBAMOL 500 MG PO TABS: 500.0000 mg | ORAL_TABLET | Freq: Four times a day (QID) | ORAL | Status: DC | PRN

## 2020-11-29 MED ORDER — MIDAZOLAM HCL 5 MG/5ML IJ SOLN
INTRAMUSCULAR | Status: DC | PRN
  Administered 2020-11-29 (×2): 2 mg via INTRAVENOUS

## 2020-11-29 MED ORDER — METOCLOPRAMIDE HCL 5 MG/ML IJ SOLN
5.0000 mg | Freq: Three times a day (TID) | INTRAMUSCULAR | Status: DC | PRN
  Administered 2020-11-29 – 2020-11-30 (×2): 10 mg via INTRAVENOUS
  Filled 2020-11-29 (×2): qty 2

## 2020-11-29 MED ORDER — VANCOMYCIN HCL 1000 MG/200ML IV SOLN
1000.0000 mg | Freq: Two times a day (BID) | INTRAVENOUS | Status: AC
  Administered 2020-11-29: 1000 mg via INTRAVENOUS
  Filled 2020-11-29: qty 200

## 2020-11-29 MED ORDER — BUPIVACAINE-EPINEPHRINE (PF) 0.25% -1:200000 IJ SOLN
INTRAMUSCULAR | Status: DC | PRN
  Administered 2020-11-29: 30 mL

## 2020-11-29 MED ORDER — OXYCODONE HCL 5 MG PO TABS: 5.0000 mg | ORAL_TABLET | Freq: Once | ORAL | Status: DC | PRN

## 2020-11-29 MED ORDER — METOCLOPRAMIDE HCL 10 MG PO TABS: 5.0000 mg | ORAL_TABLET | Freq: Three times a day (TID) | ORAL | Status: DC | PRN

## 2020-11-29 MED ORDER — BUPIVACAINE-EPINEPHRINE (PF) 0.25% -1:200000 IJ SOLN
INTRAMUSCULAR | Status: AC
  Filled 2020-11-29: qty 30

## 2020-11-29 MED ORDER — DIPHENHYDRAMINE HCL 12.5 MG/5ML PO ELIX: 12.5000 mg | ORAL_SOLUTION | ORAL | Status: DC | PRN

## 2020-11-29 MED ORDER — PROPOFOL 1000 MG/100ML IV EMUL
INTRAVENOUS | Status: AC
  Filled 2020-11-29: qty 100

## 2020-11-29 MED ORDER — PANTOPRAZOLE SODIUM 40 MG PO TBEC
40.0000 mg | DELAYED_RELEASE_TABLET | Freq: Every day | ORAL | Status: DC
  Administered 2020-11-29 – 2020-12-01 (×3): 40 mg via ORAL
  Filled 2020-11-29 (×3): qty 1

## 2020-11-29 MED ORDER — NEOMYCIN-POLYMYXIN B GU 40-200000 IR SOLN
Status: AC
  Filled 2020-11-29: qty 2

## 2020-11-29 MED ORDER — OXYCODONE HCL 5 MG PO TABS
5.0000 mg | ORAL_TABLET | ORAL | Status: DC | PRN
  Administered 2020-11-30: 10 mg via ORAL
  Filled 2020-11-29: qty 2

## 2020-11-29 MED ORDER — POLYVINYL ALCOHOL 1.4 % OP SOLN
Freq: Every day | OPHTHALMIC | Status: DC | PRN
  Filled 2020-11-29: qty 15

## 2020-11-29 MED ORDER — FENTANYL CITRATE (PF) 100 MCG/2ML IJ SOLN: 25.0000 ug | INTRAMUSCULAR | Status: DC | PRN

## 2020-11-29 MED ORDER — PROPOFOL 500 MG/50ML IV EMUL
INTRAVENOUS | Status: DC | PRN
  Administered 2020-11-29: 50 ug/kg/min via INTRAVENOUS

## 2020-11-29 MED ORDER — BISACODYL 10 MG RE SUPP: 10.0000 mg | Freq: Every day | RECTAL | Status: DC | PRN

## 2020-11-29 MED ORDER — FAMOTIDINE 20 MG PO TABS: 20.0000 mg | ORAL_TABLET | Freq: Once | ORAL | Status: AC

## 2020-11-29 MED ORDER — ONDANSETRON HCL 4 MG/2ML IJ SOLN
INTRAMUSCULAR | Status: AC
  Administered 2020-11-29: 4 mg via INTRAVENOUS
  Filled 2020-11-29: qty 2

## 2020-11-29 MED ORDER — FAMOTIDINE 20 MG PO TABS
ORAL_TABLET | ORAL | Status: AC
  Administered 2020-11-29: 20 mg via ORAL
  Filled 2020-11-29: qty 1

## 2020-11-29 MED ORDER — ONDANSETRON HCL 4 MG PO TABS: 4.0000 mg | ORAL_TABLET | Freq: Four times a day (QID) | ORAL | Status: DC | PRN

## 2020-11-29 MED ORDER — LEVOTHYROXINE SODIUM 50 MCG PO TABS
50.0000 ug | ORAL_TABLET | Freq: Every day | ORAL | Status: DC
  Administered 2020-11-30 – 2020-12-01 (×2): 50 ug via ORAL
  Filled 2020-11-29 (×2): qty 1

## 2020-11-29 MED ORDER — OXYCODONE HCL 5 MG PO TABS
10.0000 mg | ORAL_TABLET | ORAL | Status: DC | PRN
  Administered 2020-11-29 (×2): 10 mg via ORAL
  Administered 2020-11-30: 15 mg via ORAL
  Administered 2020-12-01: 10 mg via ORAL
  Filled 2020-11-29: qty 2
  Filled 2020-11-29 (×2): qty 3
  Filled 2020-11-29 (×2): qty 2

## 2020-11-29 MED ORDER — CLOBETASOL PROPIONATE 0.05 % EX OINT
1.0000 "application " | TOPICAL_OINTMENT | Freq: Two times a day (BID) | CUTANEOUS | Status: DC | PRN
  Filled 2020-11-29: qty 15

## 2020-11-29 MED ORDER — CHLORHEXIDINE GLUCONATE 0.12 % MT SOLN
OROMUCOSAL | Status: AC
  Administered 2020-11-29: 15 mL via OROMUCOSAL
  Filled 2020-11-29: qty 15

## 2020-11-29 MED ORDER — METHOCARBAMOL 1000 MG/10ML IJ SOLN
500.0000 mg | Freq: Four times a day (QID) | INTRAVENOUS | Status: DC | PRN
  Filled 2020-11-29: qty 5

## 2020-11-29 MED ORDER — ACETAMINOPHEN 500 MG PO TABS
1000.0000 mg | ORAL_TABLET | Freq: Four times a day (QID) | ORAL | Status: AC
  Administered 2020-11-29 – 2020-11-30 (×4): 1000 mg via ORAL
  Filled 2020-11-29 (×4): qty 2

## 2020-11-29 SURGICAL SUPPLY — 71 items
APL PRP STRL LF DISP 70% ISPRP (MISCELLANEOUS) ×2
BLADE SAGITTAL 25.0X1.19X90 (BLADE) ×2 IMPLANT
BLADE SAW 90X13X1.19 OSCILLAT (BLADE) ×2 IMPLANT
BNDG ELASTIC 6X5.8 VLCR STR LF (GAUZE/BANDAGES/DRESSINGS) ×2 IMPLANT
CANISTER SUCT 1200ML W/VALVE (MISCELLANEOUS) ×2 IMPLANT
CANISTER SUCT 3000ML PPV (MISCELLANEOUS) ×4 IMPLANT
CANISTER WOUND CARE 500ML ATS (WOUND CARE) ×2 IMPLANT
CEMENT HV SMART SET (Cement) ×4 IMPLANT
CEMENT PATELLA RESURF SZ1 (Cement) ×1 IMPLANT
CHLORAPREP W/TINT 26 (MISCELLANEOUS) ×4 IMPLANT
COOLER POLAR GLACIER W/PUMP (MISCELLANEOUS) ×2 IMPLANT
COVER WAND RF STERILE (DRAPES) ×2 IMPLANT
CUFF TOURN SGL QUICK 24 (TOURNIQUET CUFF)
CUFF TOURN SGL QUICK 30 (TOURNIQUET CUFF)
CUFF TRNQT CYL 24X4X16.5-23 (TOURNIQUET CUFF) IMPLANT
CUFF TRNQT CYL 30X4X21-28X (TOURNIQUET CUFF) IMPLANT
DRAPE 3/4 80X56 (DRAPES) ×4 IMPLANT
DRSG MEPILEX SACRM 8.7X9.8 (GAUZE/BANDAGES/DRESSINGS) ×2 IMPLANT
ELECT CAUTERY BLADE 6.4 (BLADE) ×2 IMPLANT
ELECT REM PT RETURN 9FT ADLT (ELECTROSURGICAL) ×2
ELECTRODE REM PT RTRN 9FT ADLT (ELECTROSURGICAL) ×1 IMPLANT
FEMERAL COMPONENT LEFT SZ2P (Femur) ×1 IMPLANT
GAUZE SPONGE 4X4 12PLY STRL (GAUZE/BANDAGES/DRESSINGS) ×2 IMPLANT
GAUZE XEROFORM 1X8 LF (GAUZE/BANDAGES/DRESSINGS) ×2 IMPLANT
GLOVE SURG ORTHO LTX SZ8 (GLOVE) ×2 IMPLANT
GLOVE SURG SYN 9.0  PF PI (GLOVE) ×2
GLOVE SURG SYN 9.0 PF PI (GLOVE) ×1 IMPLANT
GLOVE SURG UNDER LTX SZ8 (GLOVE) ×2 IMPLANT
GLOVE SURG UNDER POLY LF SZ9 (GLOVE) ×2 IMPLANT
GOWN SRG 2XL LVL 4 RGLN SLV (GOWNS) ×1 IMPLANT
GOWN STRL NON-REIN 2XL LVL4 (GOWNS) ×2
GOWN STRL REUS W/ TWL LRG LVL3 (GOWN DISPOSABLE) ×1 IMPLANT
GOWN STRL REUS W/ TWL XL LVL3 (GOWN DISPOSABLE) ×1 IMPLANT
GOWN STRL REUS W/TWL LRG LVL3 (GOWN DISPOSABLE) ×2
GOWN STRL REUS W/TWL XL LVL3 (GOWN DISPOSABLE) ×2
HOLDER FOLEY CATH W/STRAP (MISCELLANEOUS) ×2 IMPLANT
HOOD PEEL AWAY FLYTE STAYCOOL (MISCELLANEOUS) ×4 IMPLANT
IRRIGATION SURGIPHOR STRL (IV SOLUTION) IMPLANT
IV NS IRRIG 3000ML ARTHROMATIC (IV SOLUTION) ×2 IMPLANT
KIT PREVENA INCISION MGT20CM45 (CANNISTER) ×2 IMPLANT
KIT TURNOVER KIT A (KITS) ×2 IMPLANT
MANIFOLD NEPTUNE II (INSTRUMENTS) ×2 IMPLANT
NDL SAFETY ECLIPSE 18X1.5 (NEEDLE) ×1 IMPLANT
NDL SPNL 18GX3.5 QUINCKE PK (NEEDLE) ×1 IMPLANT
NDL SPNL 20GX3.5 QUINCKE YW (NEEDLE) ×1 IMPLANT
NEEDLE HYPO 18GX1.5 SHARP (NEEDLE) ×2
NEEDLE SPNL 18GX3.5 QUINCKE PK (NEEDLE) ×2 IMPLANT
NEEDLE SPNL 20GX3.5 QUINCKE YW (NEEDLE) ×2 IMPLANT
NS IRRIG 1000ML POUR BTL (IV SOLUTION) ×2 IMPLANT
PACK TOTAL KNEE (MISCELLANEOUS) ×2 IMPLANT
PAD WRAPON POLAR KNEE (MISCELLANEOUS) ×1 IMPLANT
PENCIL SMOKE EVACUATOR COATED (MISCELLANEOUS) ×2 IMPLANT
PULSAVAC PLUS IRRIG FAN TIP (DISPOSABLE) ×2
SCALPEL PROTECTED #10 DISP (BLADE) ×4 IMPLANT
STAPLER SKIN PROX 35W (STAPLE) ×2 IMPLANT
STEM EXTENSION 11MMX30MM (Stem) ×1 IMPLANT
SUCTION FRAZIER HANDLE 10FR (MISCELLANEOUS) ×2
SUCTION TUBE FRAZIER 10FR DISP (MISCELLANEOUS) ×1 IMPLANT
SUT DVC 2 QUILL PDO  T11 36X36 (SUTURE) ×2
SUT DVC 2 QUILL PDO T11 36X36 (SUTURE) ×1 IMPLANT
SUT ETHIBOND 2 V 37 (SUTURE) IMPLANT
SUT V-LOC 90 ABS DVC 3-0 CL (SUTURE) ×2 IMPLANT
SYR 20ML LL LF (SYRINGE) ×2 IMPLANT
SYR 50ML LL SCALE MARK (SYRINGE) ×4 IMPLANT
TIBIAL INSERT SZ2 LEFT (Insert) ×1 IMPLANT
TIBIAL TRAY FIXED MEDACTA 0207 (Joint) ×1 IMPLANT
TIP FAN IRRIG PULSAVAC PLUS (DISPOSABLE) ×1 IMPLANT
TOWEL OR 17X26 4PK STRL BLUE (TOWEL DISPOSABLE) ×2 IMPLANT
TOWER CARTRIDGE SMART MIX (DISPOSABLE) ×2 IMPLANT
TRAY FOLEY MTR SLVR 16FR STAT (SET/KITS/TRAYS/PACK) ×2 IMPLANT
WRAPON POLAR PAD KNEE (MISCELLANEOUS) ×2

## 2020-11-29 NOTE — Transfer of Care (Signed)
Immediate Anesthesia Transfer of Care Note  Patient: Annette [name]  Procedure(s) Performed: Left total knee arthroplasty - Rachelle Hora to Assist (Left Knee)  Patient Location: PACU  Anesthesia Type:Spinal  Level of Consciousness: awake, alert  and oriented  Airway & Oxygen Therapy: Patient Spontanous Breathing  Post-op Assessment: Report given to RN and Post -op Vital signs reviewed and stable  Post vital signs: Reviewed  Last Vitals:  Vitals Value Taken Time  BP    Temp    Pulse    Resp    SpO2      Last Pain:  Vitals:   11/29/20 0617  TempSrc: Temporal  PainSc: 0-No pain      Patients Stated Pain Goal: 0 (32/12/24 8250)  Complications: No complications documented.

## 2020-11-29 NOTE — H&P (Signed)
Chief Complaint  Patient presents with  . Pre-op Exam  Left TKA   History of the Present Illness: Annette [name] is a 65 y.o. female here for history and physical for left total knee arthroplasty with Dr. Hessie Knows on 11/22/2020. Patient has had years of progressive bilateral knee pain, left knee pain greater than right. X-rays of the left knee from 12/19/2019 show severe degenerative changes in the medial compartment with varus deformity with degenerative changes in the lateral and patellofemoral compartment. No improvement with over-the-counter medications. She has had injections with initially some relief but recently have not been giving her any relief. She is very active and enjoys gardening and is unable to perform these activities of daily living due to left knee pain. Her left knee pain is moderate to severe along the medial joint line and will swell with activity. Her knee will occasionally give way. Her pain is present with weightbearing activity and she will also have some discomfort with rest. She has had a prior knee arthroscopy by Dr. Jefm Bryant.  I have reviewed past medical, surgical, social and family history, and allergies as documented in the EMR.  Past Medical History: Past Medical History:  Diagnosis Date  . Anxiety  . Anxiety and depression  . Arthritis  . Elevated BP  . History of cataract  . Hyperlipidemia  . Hyperthyroidism, unspecified  Evaluated by Dr. Burke Keels at [facility] 2009; s/p I131 ablation 9/10.  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 Migraine  . Migraines 01/04/2014  . Osteoarthritis  . Psoriasis   Past Surgical History: Past Surgical History:  Procedure Laterality Date  . Arthroscopic partial medial meniscectomy Right 05/12/14  . ARTHROSCOPY KNEE Left 09/14/2015  . CAROTID ENDARTERECTOMY Right 06/24/2017  . CATARACT EXTRACTION Left 06/22/2018  . CESAREAN SECTION  x 2  . HYSTERECTOMY 03/2007  fibroids  . KNEE  ARTHROSCOPY Left 09/14/2015  . OTHER SURGERY N/A 2010  THYROID ABLATION  . TUBAL LIGATION   Past Family History: Family History  Problem Relation Age of Onset  . Arthritis Mother  . Gout Mother  . Osteoporosis (Thinning of bones) Mother  . Coronary Artery Disease (Blocked arteries around heart) Mother  . Hyperlipidemia (Elevated cholesterol) Mother  . Hip fracture Mother  . High blood pressure (Hypertension) Mother  . Myocardial Infarction (Heart attack) Father  . Colon polyps Father  . Diabetes type II Sister  . Breast cancer Sister  . Coronary Artery Disease (Blocked arteries around heart) Brother  . Coronary Artery Disease (Blocked arteries around heart) Brother   Medications: Current Outpatient Medications Ordered in Epic  Medication Sig Dispense Refill  . ALPRAZolam (XANAX) 0.25 MG tablet TAKE 1 TABLET (0.25 MG TOTAL) BY MOUTH ONCE DAILY AS NEEDED FOR SLEEP 45 tablet 1  . aspirin 81 MG EC tablet Take 81 mg by mouth once daily.  Marland Kitchen aspirin-acetaminophen-caffeine (EXCEDRIN MIGRAINE) 250-250-65 mg per tablet Take 1 tablet by mouth every 6 (six) hours as needed  . atorvastatin (LIPITOR) 40 MG tablet Take 1 tablet (40 mg total) by mouth once daily 90 tablet 3  . citalopram (CELEXA) 20 MG tablet TAKE 1 TABLET BY MOUTH DAILY 90 tablet 1  . fluticasone propionate (FLOVENT HFA) 110 mcg/actuation inhaler Inhale 1 inhalation into the lungs 2 (two) times daily 12 g 3  . levothyroxine (SYNTHROID) 50 MCG tablet TAKE 1 TABLET BY MOUTH EVERY DAY. WAIT 30 MINUTES BBEFORE TAKING OTHER MEDICATIONS 90 tablet 1  . multivitamin tablet Take 1 tablet by  mouth once daily  . traMADoL (ULTRAM) 50 mg tablet Take 50 mg by mouth every 6 (six) hours as needed for Pain  . ULTRAVATE 0.05 % Lotn APPLY TO THE AFFECTED AREA TWICE DAILY 1   No current Epic-ordered facility-administered medications on file.   Allergies: Allergies  Allergen Reactions  . Celebrex [Celecoxib] Other (See Comments)  Ocular  migraines  . Clindamycin Nausea  . Doxycycline Headache  . Penicillins Unknown    There is no height or weight on file to calculate BMI.  Review of Systems: A comprehensive 14 point ROS was performed, reviewed, and the pertinent orthopaedic findings are documented in the HPI.  There were no vitals filed for this visit.  General Physical Examination:  General:  Well developed, well nourished, no apparent distress, normal affect, normal gait with no antalgic component.   HEENT: Head normocephalic, atraumatic, PERRL.   Abdomen: Soft, non tender, non distended, Bowel sounds present.  Heart: Examination of the heart reveals regular, rate, and rhythm. There is no murmur noted on ascultation. There is a normal apical pulse.  Lungs: Lungs are clear to auscultation. There is no wheeze, rhonchi, or crackles. There is normal expansion of bilateral chest walls.   Musculoskeletal Examination: On exam, tender along the medial joint line of the left knee. Varus deformities to bilateral knees with crepitation on range of motion. Range of motion is about 5 to 115 degrees bilaterally. Left knee is stable to valgus and varus stress testing. No swelling warmth erythema. No swelling or edema throughout the left leg.  Radiographs: X-rays of the left knee reviewed by me today from 12/19/2019 show moderate to severe patellofemoral arthritic changes with 90% loss of joint space in the medial compartment with severe sclerotic changes in the medial tibial plateau. Mild joint space narrowing in the lateral compartment. No evidence of acute bony abnormality.  Assessment: ICD-10-CM  1. Primary osteoarthritis of left knee M17.12   Plan: 1. Risks, benefits, complications of a left total knee arthroplasty have been discussed with the patient. Patient has agreed and consented the procedure with Dr. Hessie Knows on 11/22/2020.    Electronically signed by Feliberto Gottron, [place] at 11/14/2020 12:04 PM  EDT   Reviewed  H+P. No changes noted.

## 2020-11-29 NOTE — Progress Notes (Signed)
Met with the patient in the room, she lives at home with her spouse, she has a RW and a toilet riser at home and states that she does not need additional DME, She is set up with [facility] for HH She has a PCP and transportation to the Dr. She can afford her medications 

## 2020-11-29 NOTE — Plan of Care (Signed)

## 2020-11-29 NOTE — Op Note (Signed)
11/29/2020  9:31 AM  PATIENT:  Annette [name]   MRN: [id number]  PRE-OPERATIVE DIAGNOSIS:  Primary localized osteoarthritis of left knee   POST-OPERATIVE DIAGNOSIS:  Same   PROCEDURE:  Procedure(s): Left TOTAL KNEE ARTHROPLASTY   SURGEON: Laurene Footman, MD   ASSISTANTS: Rachelle Hora, PA-C   ANESTHESIA:   spinal   EBL:   100 cc   BLOOD ADMINISTERED:none   DRAINS: Incisional wound VAC    LOCAL MEDICATIONS USED:  MARCAINE    and OTHER Exparel and morphine   SPECIMEN:  No Specimen   DISPOSITION OF SPECIMEN:  N/A   COUNTS:  YES   TOURNIQUET:   58 minutes at 300 mm Hg   IMPLANTS: Medacta  GMK sphere system with  2+ femur, 2 tibia with short stem and  11 mm insert.  Size  1 patella, all components cemented.   DICTATION: Viviann Spare Dictation   patient was brought to the operating room and spinal anesthesia was obtained.  After prepping and draping the  left leg in sterile fashion, and after patient identification and timeout procedures were completed, tourniquet was raised  and midline skin incision was made followed by medial parapatellar arthrotomy with  severe medial compartment osteoarthritis, severe patellofemoral arthritis and  mild lateral compartment arthritis, partial synovectomy was also carried out.   The ACL and PCL and fat pad were excised along with anterior horns of the meniscus. The proximal tibia cutting guide from  the [facility] system was applied and the proximal tibia cut carried out.  The distal femoral cut was carried out in a similar fashion     The  2+ femoral cutting guide applied with anterior posterior and chamfer cuts made.  The posterior horns of the menisci were removed at this point.   Injection of the above medication was carried out after the femoral and tibial cuts were carried out.  The  2 baseplate trial was placed pinned into position and proximal tibial preparation carried out with drilling hand reaming and the keel punch followed by placement of the  2+  femur and sizing the tibial insert size   11 millimeter gave the best fit with stability and full extension.  The distal femoral drill holes were made in the notch cut for the trochlear groove was then carried out with trials were then removed the patella was cut using the patellar cutting guide and it sized to a size  1 after drill holes have been made  The knee was irrigated with pulsatile lavage and the bony surfaces dried the tibial component was cemented into place first.  Excess cement was removed and the polyethylene insert placed with a torque screw placed with a torque screwdriver tightened.  The distal femoral component was placed and the knee was held in extension as the patellar button was clamped into place.  After the cement was set, excess cement was removed and the knee was again irrigated thoroughly thoroughly irrigated.  The tourniquet was let down and hemostasis checked with electrocautery. The arthrotomy was repaired with a heavy Quill suture,  followed by 3-0 V lock subcuticular closure, skin staples followed by incisional wound VAC and Polar Care.Marland Kitchen   PLAN OF CARE: Admit for overnight observation   PATIENT DISPOSITION:  PACU - hemodynamically stable.

## 2020-11-29 NOTE — Anesthesia Preprocedure Evaluation (Signed)
Anesthesia Evaluation  Patient identified by MRN, date of birth, ID band Patient awake    Reviewed: Allergy & Precautions, H&P , NPO status , Patient's Chart, lab work & pertinent test results  History of Anesthesia Complications (+) Family history of anesthesia reaction and history of anesthetic complications  Airway Mallampati: III  TM Distance: <3 FB Neck ROM: full    Dental  (+) Chipped   Pulmonary neg shortness of breath, former smoker,    Pulmonary exam normal        Cardiovascular Exercise Tolerance: Good (-) angina+ CAD  (-) Past MI Normal cardiovascular exam     Neuro/Psych  Headaches, PSYCHIATRIC DISORDERS  Neuromuscular disease    GI/Hepatic negative GI ROS, Neg liver ROS,   Endo/Other  Hypothyroidism   Renal/GU      Musculoskeletal  (+) Arthritis ,   Abdominal   Peds  Hematology negative hematology ROS (+)   Anesthesia Other Findings Past Medical History: No date: Anemia     Comment:  Jan slightly anemic No date: Anxiety No date: Arthritis     Comment:  FINGERS, knees No date: CAD (coronary artery disease)     Comment:  had carotid endarterectomy No date: Complication of anesthesia     Comment:  nausea, headaches, achy legs No date: Family history of adverse reaction to anesthesia     Comment:  SISTERS BOTH GET NAUSEATED 05/2017: Headache     Comment:  migraines - precipitated by extended use of               celebrex/meloxicam 07/11/2020: History of 2019 novel coronavirus disease (COVID-19) No date: Hyperlipidemia 9/10: Hypothyroidism     Comment:  s/p ablation/ GRAVES DISEASE No date: Neuromuscular disorder ([place])     Comment:  NUMBNESS IN HANDS OR LIPS OCCASIONALLY/ carpal tunnel No date: Psoriasis  Past Surgical History: 8/08: ABDOMINAL HYSTERECTOMY 06/16/2018: CATARACT EXTRACTION W/PHACO; Left     Comment:  Procedure: CATARACT EXTRACTION PHACO AND INTRAOCULAR               LENS  PLACEMENT ([place]) LEFT;  Surgeon: Leandrew Koyanagi, MD;  Location: [place];  Service:               Ophthalmology;  Laterality: Left; 07/27/2019: CATARACT EXTRACTION W/PHACO; Right     Comment:  Procedure: CATARACT EXTRACTION PHACO AND INTRAOCULAR               LENS PLACEMENT ([facility]) RIGHT TECNIS ADD 6.17, 00:56.9,               27.1%;  Surgeon: Leandrew Koyanagi, MD;  Location:               [place];  Service: Ophthalmology;                Laterality: Right; No date: CESAREAN SECTION     Comment:  x2 10/20/2016: COLONOSCOPY WITH PROPOFOL; N/A     Comment:  Procedure: COLONOSCOPY WITH PROPOFOL;  Surgeon: Lucilla Lame, MD;  Location: [place];  Service:               Endoscopy;  Laterality: N/A; 01/11/2018: COLONOSCOPY WITH PROPOFOL; N/A     Comment:  Procedure: COLONOSCOPY WITH PROPOFOL;  Surgeon: Allen Norris,  Darren, MD;  Location: [place];  Service:               Endoscopy;  Laterality: N/A; 06/24/2017: ENDARTERECTOMY; Right     Comment:  Procedure: ENDARTERECTOMY CAROTID;  Surgeon: Algernon Huxley, MD;  Location: [facility];  Service: Vascular;                Laterality: Right; 09/14/2015: KNEE ARTHROSCOPY; Left     Comment:  Procedure: ARTHROSCOPY KNEE;  Surgeon: Leanor Kail,               MD;  Location: [place];  Service:               Orthopedics;  Laterality: Left; 05/12/14: KNEE ARTHROSCOPY W/ PARTIAL MEDIAL MENISCECTOMY; Right 10/20/2016: POLYPECTOMY     Comment:  Procedure: POLYPECTOMY;  Surgeon: Lucilla Lame, MD;                Location: [place];  Service: Endoscopy;; 01/11/2018: POLYPECTOMY     Comment:  Procedure: POLYPECTOMY;  Surgeon: Lucilla Lame, MD;                Location: [place];  Service: Endoscopy;; 2010: THYROID SURGERY     Comment:  ablation  BMI    Body Mass Index: 33.40 kg/m      Reproductive/Obstetrics negative OB ROS                              Anesthesia Physical Anesthesia Plan  ASA: III  Anesthesia Plan: Spinal   Post-op Pain Management:    Induction:   PONV Risk Score and Plan:   Airway Management Planned: Natural Airway and Nasal Cannula  Additional Equipment:   Intra-op Plan:   Post-operative Plan:   Informed Consent: I have reviewed the patients History and Physical, chart, labs and discussed the procedure including the risks, benefits and alternatives for the proposed anesthesia with the patient or authorized representative who has indicated his/her understanding and acceptance.     Dental Advisory Given  Plan Discussed with: Anesthesiologist, CRNA and Surgeon  Anesthesia Plan Comments: (Patient reports no bleeding problems and no anticoagulant use.  Plan for spinal with backup GA  Patient consented for risks of anesthesia including but not limited to:  - adverse reactions to medications - damage to eyes, teeth, lips or other oral mucosa - nerve damage due to positioning  - risk of bleeding, infection and or nerve damage from spinal that could lead to paralysis - risk of headache or failed spinal - damage to teeth, lips or other oral mucosa - sore throat or hoarseness - damage to heart, brain, nerves, lungs, other parts of body or loss of life  Patient voiced understanding.)        Anesthesia Quick Evaluation

## 2020-11-29 NOTE — Anesthesia Procedure Notes (Signed)
Spinal  Patient location during procedure: OR Reason for block: surgical anesthesia Staffing Performed: resident/CRNA  Anesthesiologist: Piscitello, Precious Haws, MD Resident/CRNA: Rolla Plate, CRNA Preanesthetic Checklist Completed: patient identified, IV checked, site marked, risks and benefits discussed, surgical consent, monitors and equipment checked, pre-op evaluation and timeout performed Spinal Block Patient position: sitting Prep: ChloraPrep and site prepped and draped Patient monitoring: heart rate, continuous pulse ox, blood pressure and cardiac monitor Approach: midline Location: L4-5 Injection technique: single-shot Needle Needle type: Introducer and Pencan  Needle gauge: 24 G Needle length: 9 cm Assessment Events: CSF return Additional Notes Negative paresthesia. Negative blood return. Positive free-flowing CSF. Expiration date of kit checked and confirmed. Patient tolerated procedure well, without complications.

## 2020-11-29 NOTE — Evaluation (Signed)
Physical Therapy Evaluation Patient Details Name: Annette [name] MRN: [id number] DOB: 06-13-1956 Today's Date: 11/29/2020   History of Present Illness  Pt is a 65 yo female s/p L TKA, WBAT. PMH of former smoker, hypothyrodism, graves disease.  Clinical Impression  Patient alert, oriented, reported pain as 9/10 but eager to attempt getting up to see if that would help. After ambulating she reported 5-6/10 pain, but that was increasing propped TKE at end of session. Pt reported being independent at baseline.   Patient was able to perform several supine exercises with verbal/tactile cues including SLR without lag. Supine to sit with CGA, good sitting balance noted. Sit <> stand from EOB and recliner with cues for safety and RW positioning. After a seated rest break she was able to ambulate ~62ft with RW and CGA, antalgic gait noted, no LOB or buckling noted. Pt did experience nausea bouts during session as well as some minor dizziness that resolved.  Overall the patient demonstrated deficits (see "PT Problem List") that impede the patient's functional abilities, safety, and mobility and would benefit from skilled PT intervention. Recommendation is HHPT with supervision for mobility/OOB pending progress.     Follow Up Recommendations Home health PT;Supervision for mobility/OOB    Equipment Recommendations  Rolling walker with 5" wheels    Recommendations for Other Services       Precautions / Restrictions Precautions Precautions: Fall;Knee Precaution Booklet Issued: No Restrictions Weight Bearing Restrictions: Yes LLE Weight Bearing: Weight bearing as tolerated      Mobility  Bed Mobility Overal bed mobility: Needs Assistance Bed Mobility: Supine to Sit     Supine to sit: Min guard;HOB elevated          Transfers Overall transfer level: Needs assistance Equipment used: Rolling walker (2 wheeled) Transfers: Sit to/from Stand Sit to Stand: Min guard             Ambulation/Gait Ambulation/Gait assistance: Counsellor (Feet): 30 Feet Assistive device: Rolling walker (2 wheeled)       General Gait Details: antalgic gait noted on LLE, able to weight  bear appropriately  Stairs            Wheelchair Mobility    Modified Rankin (Stroke Patients Only)       Balance Overall balance assessment: Needs assistance Sitting-balance support: Feet supported Sitting balance-Leahy Scale: Normal       Standing balance-Leahy Scale: Fair                               Pertinent Vitals/Pain Pain Assessment: 0-10 Pain Score: 9  Pain Location: 5-6 by the end of the session up in recliner, but did increased propped up on towel roll Pain Descriptors / Indicators: Aching;Sore;Grimacing Pain Intervention(s): Limited activity within patient's tolerance;Monitored during session;Repositioned;Ice applied;Premedicated before session    Home Living Family/patient expects to be discharged to:: Private residence Living Arrangements: Spouse/significant other Available Help at Discharge: Family Type of Home: House Home Access: Stairs to enter Entrance Stairs-Rails: Right Entrance Stairs-Number of Steps: [place]: Two level;Able to live on main level with bedroom/bathroom Home Equipment: Gilford Rile - 2 wheels;Walker - 4 wheels      Prior Function Level of Independence: Independent               Hand Dominance   Dominant Hand: Right    Extremity/Trunk Assessment   Upper Extremity Assessment Upper Extremity Assessment: Defer to OT evaluation  Lower Extremity Assessment Lower Extremity Assessment: RLE deficits/detail;LLE deficits/detail RLE Deficits / Details: WFLs LLE Deficits / Details: s/p L TKA       Communication   Communication: No difficulties  Cognition Arousal/Alertness: Awake/alert Behavior During Therapy: WFL for tasks assessed/performed Overall Cognitive Status: Within Functional Limits for  tasks assessed                                        General Comments      Exercises     Assessment/Plan    PT Assessment Patient needs continued PT services  PT Problem List Decreased strength;Decreased range of motion;Decreased activity tolerance;Decreased balance;Decreased mobility;Decreased knowledge of precautions;Pain;Decreased knowledge of use of DME       PT Treatment Interventions DME instruction;Balance training;Gait training;Neuromuscular re-education;Stair training;Functional mobility training;Patient/family education;Therapeutic activities;Therapeutic exercise    PT Goals (Current goals can be found in the Care Plan section)  Acute Rehab PT Goals Patient Stated Goal: to go home PT Goal Formulation: With patient Time For Goal Achievement: 12/13/20 Potential to Achieve Goals: Good    Frequency BID   Barriers to discharge        Co-evaluation               AM-PAC PT "6 Clicks" Mobility  Outcome Measure Help needed turning from your back to your side while in a flat bed without using bedrails?: None Help needed moving from lying on your back to sitting on the side of a flat bed without using bedrails?: None Help needed moving to and from a bed to a chair (including a wheelchair)?: A Little Help needed standing up from a chair using your arms (e.g., wheelchair or bedside chair)?: A Little Help needed to walk in hospital room?: A Little Help needed climbing 3-5 steps with a railing? : A Little 6 Click Score: 20    End of Session Equipment Utilized During Treatment: Gait belt Activity Tolerance: Patient tolerated treatment well Patient left: in chair;with call bell/phone within reach;with chair alarm set Nurse Communication: Mobility status PT Visit Diagnosis: Other abnormalities of gait and mobility (R26.89);Muscle weakness (generalized) (M62.81);Difficulty in walking, not elsewhere classified (R26.2)    Time: 1350-1436 PT Time  Calculation (min) (ACUTE ONLY): 46 min   Charges:   PT Evaluation $PT Eval Low Complexity: 1 Low PT Treatments $Gait Training: 8-22 mins $Therapeutic Exercise: 23-37 mins      Lieutenant Diego PT, DPT 3:14 PM,11/29/20

## 2020-11-30 ENCOUNTER — Encounter: Payer: Self-pay | Admitting: Orthopedic Surgery

## 2020-11-30 LAB — CBC
HCT: 35.2 % — ABNORMAL LOW (ref 36.0–46.0)
Hemoglobin: 12.1 g/dL (ref 12.0–15.0)
MCH: 33.2 pg (ref 26.0–34.0)
MCHC: 34.4 g/dL (ref 30.0–36.0)
MCV: 96.4 fL (ref 80.0–100.0)
Platelets: 294 10*3/uL (ref 150–400)
RBC: 3.65 MIL/uL — ABNORMAL LOW (ref 3.87–5.11)
RDW: 12.7 % (ref 11.5–15.5)
WBC: 10.9 10*3/uL — ABNORMAL HIGH (ref 4.0–10.5)
nRBC: 0 % (ref 0.0–0.2)

## 2020-11-30 LAB — BASIC METABOLIC PANEL
Anion gap: 10 (ref 5–15)
BUN: 13 mg/dL (ref 8–23)
CO2: 25 mmol/L (ref 22–32)
Calcium: 8.7 mg/dL — ABNORMAL LOW (ref 8.9–10.3)
Chloride: 101 mmol/L (ref 98–111)
Creatinine, Ser: 0.7 mg/dL (ref 0.44–1.00)
GFR, Estimated: >60 mL/min (ref >60)
Glucose, Bld: 138 mg/dL — ABNORMAL HIGH (ref 70–99)
Potassium: 4.3 mmol/L (ref 3.5–5.1)
Sodium: 136 mmol/L (ref 135–145)

## 2020-11-30 MED ORDER — METHOCARBAMOL 500 MG PO TABS: 500.0000 mg | ORAL_TABLET | Freq: Four times a day (QID) | ORAL | 0 refills | Status: DC | PRN

## 2020-11-30 MED ORDER — CHLORHEXIDINE GLUCONATE CLOTH 2 % EX PADS
6.0000 | MEDICATED_PAD | Freq: Every day | CUTANEOUS | Status: DC
  Administered 2020-12-01: 6 via TOPICAL

## 2020-11-30 MED ORDER — ACETAMINOPHEN 325 MG PO TABS: 325.0000 mg | ORAL_TABLET | Freq: Four times a day (QID) | ORAL | Status: DC | PRN

## 2020-11-30 MED ORDER — ONDANSETRON HCL 4 MG PO TABS: 4.0000 mg | ORAL_TABLET | Freq: Four times a day (QID) | ORAL | 0 refills | Status: DC | PRN

## 2020-11-30 MED ORDER — ENOXAPARIN SODIUM 40 MG/0.4ML ~~LOC~~ SOLN: 40.0000 mg | SUBCUTANEOUS | 0 refills | Status: DC

## 2020-11-30 MED ORDER — TRAMADOL HCL 50 MG PO TABS: 50.0000 mg | ORAL_TABLET | Freq: Four times a day (QID) | ORAL | 0 refills | Status: DC | PRN

## 2020-11-30 MED ORDER — DOCUSATE SODIUM 100 MG PO CAPS: 100.0000 mg | ORAL_CAPSULE | Freq: Two times a day (BID) | ORAL | 0 refills | Status: DC

## 2020-11-30 MED ORDER — OXYCODONE HCL 5 MG PO TABS: 5.0000 mg | ORAL_TABLET | ORAL | 0 refills | Status: DC | PRN

## 2020-11-30 NOTE — Discharge Instructions (Signed)

## 2020-11-30 NOTE — Progress Notes (Signed)
   Subjective: 1 Day Post-Op Procedure(s) (LRB): Left total knee arthroplasty - Rachelle Hora to Assist (Left) Patient reports pain as 9 on 0-10 scale.   Patient is well, and has had no acute complaints or problems Denies any CP, SOB, ABD pain. We will continue therapy today.  Plan is to go Home after hospital stay.  Objective: Vital signs in last 24 hours: Temp:  [97.4 F (36.3 C)-98.9 F (37.2 C)] 97.4 F (36.3 C) (04/08 0426) Pulse Rate:  [57-111] 84 (04/08 0426) Resp:  [13-19] 16 (04/08 0426) BP: (121-163)/(57-80) 146/64 (04/08 0426) SpO2:  [95 %-100 %] 100 % (04/08 0426)  Intake/Output from previous day: 04/07 0701 - 04/08 0700 In: 2620 [P.O.:320; I.V.:2100; IV Piggyback:200] Out: 1030 [Urine:980; Blood:50] Intake/Output this shift: No intake/output data recorded.  Recent Labs    11/29/20 1156 11/30/20 0454  HGB 12.8 12.1   Recent Labs    11/29/20 1156 11/30/20 0454  WBC 8.5 10.9*  RBC 3.93 3.65*  HCT 38.1 35.2*  PLT 233 294   Recent Labs    11/29/20 1156 11/30/20 0454  NA  --  136  K  --  4.3  CL  --  101  CO2  --  25  BUN  --  13  CREATININE 0.70 0.70  GLUCOSE  --  138*  CALCIUM  --  8.7*   No results for input(s): LABPT, INR in the last 72 hours.  EXAM General - Patient is Alert, Appropriate and Oriented Extremity - Neurovascular intact Sensation intact distally Intact pulses distally Dorsiflexion/Plantar flexion intact No cellulitis present Compartment soft Dressing - dressing C/D/I and no drainage Motor Function - intact, moving foot and toes well on exam.   Past Medical History:  Diagnosis Date  . Anemia    Jan slightly anemic  . Anxiety   . Arthritis    FINGERS, knees  . CAD (coronary artery disease)    had carotid endarterectomy  . Complication of anesthesia    nausea, headaches, achy legs  . Family history of adverse reaction to anesthesia    SISTERS BOTH GET NAUSEATED  . Headache 05/2017   migraines - precipitated by  extended use of celebrex/meloxicam  . History of 2019 novel coronavirus disease (COVID-19) 07/11/2020  . Hyperlipidemia   . Hypothyroidism 9/10   s/p ablation/ GRAVES DISEASE  . Neuromuscular disorder ([facility])    NUMBNESS IN HANDS OR LIPS OCCASIONALLY/ carpal tunnel  . Psoriasis     Assessment/Plan:   1 Day Post-Op Procedure(s) (LRB): Left total knee arthroplasty - Rachelle Hora to Assist (Left) Active Problems:   Status post total knee replacement using cement, left  Estimated body mass index is 33.4 kg/m as calculated from the following:   Height as of this encounter: 5' (1.524 m).   Weight as of this encounter: 77.6 kg. Advance diet Up with therapy  Severe pain, need better utilization of current pain regimen Labs and vital signs stable CM to assist with discharge  DVT Prophylaxis - Lovenox, TED hose and SCDs Weight-Bearing as tolerated to left leg   T. Rachelle Hora, PA-C [place] 11/30/2020, 7:59 AM

## 2020-11-30 NOTE — Progress Notes (Signed)
Physical Therapy Treatment Patient Details Name: Annette [name] MRN: [id number] DOB: March 21, 1956 Today's Date: 11/30/2020    History of Present Illness Pt is a 65 yo female s/p L TKA, WBAT. PMH of former smoker, hypothyrodism, graves disease.    PT Comments    Patient alert, agreeable to PT, reported 6/10 L knee pain after ambulation, premedicated. The patient demonstrated sit <> stand transfers several times during session including from standard commode, CGA with RW. She ambulated ~75ft total with RW and CGA, improved cadence noted but pt fatigued after stair training. She was able to safely navigate 4 steps with R rail and CGA and able to teach back technique. Pt up in chair with all needs in reach at end of session. The patient would benefit from further skilled PT intervention to continue to progress towards goals. Recommendation remains appropriate.       Follow Up Recommendations  Home health PT;Supervision for mobility/OOB     Equipment Recommendations  Rolling walker with 5" wheels    Recommendations for Other Services       Precautions / Restrictions Precautions Precautions: Fall;Knee Precaution Booklet Issued: No Restrictions Weight Bearing Restrictions: Yes LLE Weight Bearing: Weight bearing as tolerated    Mobility  Bed Mobility Overal bed mobility: Modified Independent             General bed mobility comments: pt up in recliner at start/end of session    Transfers Overall transfer level: Needs assistance Equipment used: Rolling walker (2 wheeled) Transfers: Sit to/from Stand Sit to Stand: Min guard         General transfer comment: pt able to recall safe hand placement  Ambulation/Gait Ambulation/Gait assistance: Min guard Gait Distance (Feet): 90 Feet Assistive device: Rolling walker (2 wheeled)       General Gait Details: antalgic gait noted on LLE, able to weight  bear appropriately   Stairs Stairs: Yes Stairs assistance: Min  guard Stair Management: One rail Right Number of Stairs: 4 General stair comments: safe, steady   Wheelchair Mobility    Modified Rankin (Stroke Patients Only)       Balance Overall balance assessment: Needs assistance Sitting-balance support: Feet supported Sitting balance-Leahy Scale: Normal     Standing balance support: Bilateral upper extremity supported;Single extremity supported Standing balance-Leahy Scale: Fair                              Cognition Arousal/Alertness: Awake/alert Behavior During Therapy: WFL for tasks assessed/performed Overall Cognitive Status: Within Functional Limits for tasks assessed                                        Exercises Total Joint Exercises Ankle Circles/Pumps: AROM;10 reps;Both Quad Sets: AROM;Strengthening;Left;10 reps Hip ABduction/ADduction: AROM;Strengthening;Left;10 reps Long Arc Quad: AAROM;Strengthening;Left;10 reps Other Exercises Other Exercises: use of standard commode with CGA and grab bar, supervision for pericare    General Comments General comments (skin integrity, edema, etc.): pt still nauseated but motivated to participate with therapy      Pertinent Vitals/Pain Pain Assessment: 0-10 Pain Score: 6  Pain Location: 6 with ambulation Pain Descriptors / Indicators: Aching;Sore;Guarding Pain Intervention(s): Limited activity within patient's tolerance;Monitored during session;Repositioned;Premedicated before session    Home Living  Prior Function            PT Goals (current goals can now be found in the care plan section) Progress towards PT goals: Progressing toward goals    Frequency    BID      PT Plan Current plan remains appropriate    Co-evaluation              AM-PAC PT "6 Clicks" Mobility   Outcome Measure  Help needed turning from your back to your side while in a flat bed without using bedrails?: None Help needed  moving from lying on your back to sitting on the side of a flat bed without using bedrails?: None Help needed moving to and from a bed to a chair (including a wheelchair)?: None Help needed standing up from a chair using your arms (e.g., wheelchair or bedside chair)?: A Little Help needed to walk in hospital room?: A Little Help needed climbing 3-5 steps with a railing? : A Little 6 Click Score: 21    End of Session Equipment Utilized During Treatment: Gait belt Activity Tolerance: Patient tolerated treatment well Patient left: in chair;with call bell/phone within reach;with chair alarm set;with family/visitor present Nurse Communication: Mobility status PT Visit Diagnosis: Other abnormalities of gait and mobility (R26.89);Muscle weakness (generalized) (M62.81);Difficulty in walking, not elsewhere classified (R26.2)     Time: 4373-5789 PT Time Calculation (min) (ACUTE ONLY): 26 min  Charges:  $Gait Training: 8-22 mins $Therapeutic Activity: 8-22 mins                     Lieutenant Diego PT, DPT 3:36 PM,11/30/20

## 2020-11-30 NOTE — Evaluation (Signed)
Occupational Therapy Evaluation Patient Details Name: Annette [name] MRN: [id number] DOB: February 07, 1956 Today's Date: 11/30/2020    History of Present Illness Pt is a 65 yo female s/p L TKA, WBAT. PMH of former smoker, hypothyrodism, graves disease.   Clinical Impression   Pt seen for OT evaluation this date, POD#1 from above surgery. Pt was independent in all ADL prior to surgery and is eager to return to PLOF with less pain and improved safety and independence. Pt currently requires MIN A for LB dressing and bathing while in seated position due to pain and limited AROM of L knee, CGA for ADL transfers with RW, and MOD A for polar care and compression stocking mgt. Pt/spouse instructed in polar care mgt, falls prevention strategies, home/routines modifications, DME/AE for LB bathing and dressing tasks, RW mgt, ADL transfers, and compression stocking mgt. Pt/spouse verbalized understanding; provided with handout to support recall and carryover. Pt would benefit from skilled OT services while hospitalized including additional instruction in dressing techniques with or without assistive devices for dressing and bathing skills to support recall and carryover prior to discharge and ultimately to maximize safety, independence, and minimize falls risk and caregiver burden. Do not currently anticipate any OT needs following this hospitalization.      Follow Up Recommendations  No OT follow up    Equipment Recommendations  None recommended by OT    Recommendations for Other Services       Precautions / Restrictions Precautions Precautions: Fall;Knee Precaution Booklet Issued: Yes (comment) Restrictions Weight Bearing Restrictions: Yes LLE Weight Bearing: Weight bearing as tolerated      Mobility Bed Mobility Overal bed mobility: Needs Assistance Bed Mobility: Supine to Sit     Supine to sit: Supervision;HOB elevated     General bed mobility comments: cues for  hand placement, heavy bed rail use, increased time/effort    Transfers Overall transfer level: Needs assistance Equipment used: Rolling walker (2 wheeled) Transfers: Sit to/from Stand Sit to Stand: Min guard              Balance Overall balance assessment: Needs assistance Sitting-balance support: Feet supported Sitting balance-Leahy Scale: Normal     Standing balance support: Bilateral upper extremity supported;Single extremity supported Standing balance-Leahy Scale: Fair                             ADL either performed or assessed with clinical judgement   ADL Overall ADL's : Needs assistance/impaired                                       General ADL Comments: MIN A LB ADL, CGA for ADL transfers, MOD A for polar care and ocmpression stocking mgt; spouse able ot provide needed level of assist     Vision Patient Visual Report: No change from baseline       Perception     Praxis      Pertinent Vitals/Pain Pain Assessment: 0-10 Pain Score: 4  Pain Location: 8/10 just prior to session, relieved w/ pain meds, 4/10 after amb 5' to recliner Pain Descriptors / Indicators: Aching;Sore;Guarding Pain Intervention(s): Limited activity within patient's tolerance;Monitored during session;Premedicated before session;Repositioned     Hand Dominance Right   Extremity/Trunk Assessment Upper Extremity Assessment Upper Extremity Assessment: Overall WFL for tasks assessed   Lower Extremity Assessment Lower Extremity Assessment: LLE deficits/detail LLE  Deficits / Details: s/p L TKA       Communication Communication Communication: No difficulties   Cognition Arousal/Alertness: Awake/alert Behavior During Therapy: WFL for tasks assessed/performed Overall Cognitive Status: Within Functional Limits for tasks assessed                                     General Comments  pt nauseated upon OT's arrival, provided additional emesis  bags, RN notified and promptly brought medication    Exercises Other Exercises Other Exercises: Pt/spouse instructed in bed mobility, ADL transfers, RW mgt, falls prevention, polar care mgt, compression stocking mgt, and home/routines modifications for ADL; handout provided   Shoulder Instructions      Home Living Family/patient expects to be discharged to:: Private residence Living Arrangements: Spouse/significant other Available Help at Discharge: Family Type of Home: House Home Access: Stairs to enter Technical brewer of Steps: 4 Entrance Stairs-Rails: Right Home Layout: Two level;Able to live on main level with bedroom/bathroom     Bathroom Shower/Tub: Walk-in shower   Bathroom Toilet: Handicapped height     Home Equipment: Environmental consultant - 2 wheels;Walker - 4 wheels;Shower seat - built in;Toilet riser          Prior Functioning/Environment Level of Independence: Independent                 OT Problem List: Decreased strength;Decreased range of motion;Pain;Impaired balance (sitting and/or standing);Decreased knowledge of use of DME or AE      OT Treatment/Interventions: Self-care/ADL training;Therapeutic exercise;Therapeutic activities;DME and/or AE instruction;Patient/family education;Balance training    OT Goals(Current goals can be found in the care plan section) Acute Rehab OT Goals Patient Stated Goal: to go home OT Goal Formulation: With patient Time For Goal Achievement: 12/14/20 Potential to Achieve Goals: Good ADL Goals Pt Will Perform Lower Body Dressing: with caregiver independent in assisting;sit to/from stand Pt Will Transfer to Toilet: with supervision;ambulating (elevated commode, LRAD for amb) Additional ADL Goal #1: Pt/spouse will demo modified independence with polar care mgt Additional ADL Goal #2: Pt/spouse will demo modified independence with compression stocking mgt  OT Frequency: Min 2X/week   Barriers to D/C:             Co-evaluation              AM-PAC OT "6 Clicks" Daily Activity     Outcome Measure Help from another person eating meals?: None Help from another person taking care of personal grooming?: None Help from another person toileting, which includes using toliet, bedpan, or urinal?: A Little Help from another person bathing (including washing, rinsing, drying)?: A Little Help from another person to put on and taking off regular upper body clothing?: None Help from another person to put on and taking off regular lower body clothing?: A Little 6 Click Score: 21   End of Session Nurse Communication: Other (comment) (nausea)  Activity Tolerance: Other (comment) (limited by nausea) Patient left: with call bell/phone within reach;with chair alarm set;in chair;with family/visitor present;Other (comment) (polar care, drain in place)  OT Visit Diagnosis: Other abnormalities of gait and mobility (R26.89);Pain Pain - Right/Left: Left Pain - part of body: Knee                Time: 2800-3491 OT Time Calculation (min): 56 min Charges:  OT General Charges $OT Visit: 1 Visit OT Evaluation $OT Eval Moderate Complexity: 1 Mod OT Treatments $Self Care/Home Management :  38-52 mins  Hanley Hays, MPH, MS, OTR/L ascom [phone] 11/30/20, 10:03 AM

## 2020-11-30 NOTE — Progress Notes (Signed)
No acute events overnight, pain controll ow ith oral and Iv analgesics wound vac remains in place nil drainage. Nil complaints voiced. Vital signs stable. Observation continues.

## 2020-11-30 NOTE — Anesthesia Postprocedure Evaluation (Signed)
Anesthesia Post Note  Patient: Annette [name]  Procedure(s) Performed: Left total knee arthroplasty - Rachelle Hora to Assist (Left Knee)  Patient location during evaluation: Nursing Unit Anesthesia Type: Spinal Level of consciousness: awake, oriented and awake and alert Pain management: pain level controlled Vital Signs Assessment: post-procedure vital signs reviewed and stable Respiratory status: spontaneous breathing, respiratory function stable and nonlabored ventilation Cardiovascular status: blood pressure returned to baseline and stable Postop Assessment: no headache and no backache Anesthetic complications: no   No complications documented.   Last Vitals:  Vitals:   11/29/20 2356 11/30/20 0426  BP: (!) 154/75 (!) 146/64  Pulse: 88 84  Resp:  16  Temp: 36.6 C (!) 36.3 C  SpO2: 96% 100%    Last Pain:  Vitals:   11/30/20 0241  TempSrc:   PainSc: 4                  Proofreader

## 2020-11-30 NOTE — Discharge Summary (Signed)
Physician Discharge Summary  Patient ID: Annette [name] MRN: [id number] DOB/AGE: 10-29-1955 65 y.o.  Admit date: 11/29/2020 Discharge date: 12/01/2020 Admission Diagnoses:  Status post total knee replacement using cement, left [Z96.652]   Discharge Diagnoses: Patient Active Problem List   Diagnosis Date Noted  . Status post total knee replacement using cement, left 11/29/2020  . Obesity (BMI 30.0-34.9) 01/15/2018  . History of colonic polyps   . Rectal polyp   . Polyp of sigmoid colon   . Benign neoplasm of transverse colon   . Carotid stenosis, asymptomatic, right 06/24/2017  . Bilateral carotid artery disease ([place]) 06/24/2017  . Pulsatile tinnitus of right ear 05/15/2017  . Carotid artery stenosis 05/15/2017  . Special screening for malignant neoplasms, colon   . Benign neoplasm of descending colon   . Benign neoplasm of ascending colon   . Primary osteoarthritis of right knee 08/12/2016  . Anxiety and depression 12/20/2014  . Hyperlipidemia, unspecified 12/20/2014  . 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  . Hyperlipidemia 12/20/2014  . Knee effusion, right 08/15/2014  . Complex tear of medial meniscus as current injury 05/02/2014  . Arthritis 01/04/2014  . Migraines 01/04/2014  . Psoriasis 01/04/2014    Past Medical History:  Diagnosis Date  . Anemia    Jan slightly anemic  . Anxiety   . Arthritis    FINGERS, knees  . CAD (coronary artery disease)    had carotid endarterectomy  . Complication of anesthesia    nausea, headaches, achy legs  . Family history of adverse reaction to anesthesia    SISTERS BOTH GET NAUSEATED  . Headache 05/2017   migraines - precipitated by extended use of celebrex/meloxicam  . History of 2019 novel coronavirus disease (COVID-19) 07/11/2020  . Hyperlipidemia   . Hypothyroidism 9/10   s/p ablation/ GRAVES DISEASE  . Neuromuscular disorder ([facility])    NUMBNESS IN HANDS OR LIPS OCCASIONALLY/ carpal tunnel  . Psoriasis       Transfusion: none   Consultants (if any):   Discharged Condition: Improved  Hospital Course: Annette [name] is an 65 y.o. female who was admitted 11/29/2020 with a diagnosis of left knee osteoathritis and went to the operating room on 11/29/2020 and underwent the above named procedures.    Surgeries: Procedure(s): Left total knee arthroplasty - Rachelle Hora to Assist on 11/29/2020 Patient tolerated the surgery well. Taken to PACU where she was stabilized and then transferred to the orthopedic floor.  Started on Lovenox 30 mg q 12 hrs. SCDs applied bilaterally. Heels elevated on bed with rolled towels. No evidence of DVT. Negative Homan. Physical therapy started on day #1 for gait training and transfer. OT started day #1 for ADL and assisted devices.  Patient's foley was d/c on day #1. Patient's IV  was d/c on day #2.  On post op day #2 patient successfully completed PT goals and had bowel movement.  Patient was stable and ready for discharge to home with HHPT.  Implants: Medacta GMK sphere system with 2+femur, 2tibia with short stem and 42mm insert. Size 1patella, all components cemented.   She was given perioperative antibiotics:  Anti-infectives (From admission, onward)   Start     Dose/Rate Route Frequency Ordered Stop   11/29/20 2000  vancomycin (VANCOREADY) IVPB 1000 mg/200 mL        1,000 mg 200 mL/hr over 60 Minutes Intravenous Every 12 hours 11/29/20 1102 11/29/20 2117   11/29/20 0634  vancomycin (VANCOCIN) 1-5 GM/200ML-% IVPB  Note to Pharmacy: [place] [facility], [place]: cabinet override      11/29/20 0634 11/29/20 0750   11/29/20 0600  vancomycin (VANCOCIN) IVPB 1000 mg/200 mL premix        1,000 mg 200 mL/hr over 60 Minutes Intravenous On call to O.R. 11/29/20 0151 11/29/20 0840    .  She was given sequential compression devices, early ambulation, and Lovenox TEDs for DVT prophylaxis.  She benefited maximally from the hospital stay and there were no  complications.    Recent vital signs:  Vitals:   11/30/20 2142 12/01/20 0543  BP: (!) 169/72 (!) 163/76  Pulse: 85 (!) 103  Resp: 17 15  Temp: 98.1 F (36.7 C) 98.2 F (36.8 C)  SpO2: 95% 96%    Recent laboratory studies:  Lab Results  Component Value Date   HGB 12.1 11/30/2020   HGB 12.8 11/29/2020   HGB 13.8 11/19/2020   Lab Results  Component Value Date   WBC 10.9 (H) 11/30/2020   PLT 294 11/30/2020   Lab Results  Component Value Date   INR 0.97 06/17/2017   Lab Results  Component Value Date   NA 136 11/30/2020   K 4.3 11/30/2020   CL 101 11/30/2020   CO2 25 11/30/2020   BUN 13 11/30/2020   CREATININE 0.70 11/30/2020   GLUCOSE 138 (H) 11/30/2020    Discharge Medications:   Allergies as of 12/01/2020      Reactions   Clindamycin/lincomycin Nausea And Vomiting   headaches   Penicillins Swelling, Rash   Celebrex [celecoxib] Nausea Only, Other (See Comments)   Precipitates migraines with extended use (meloxicam also)   Doxycycline    Headache      Medication List    STOP taking these medications   aspirin 81 MG tablet   aspirin-acetaminophen-caffeine 250-250-65 MG tablet Commonly known as: EXCEDRIN MIGRAINE     TAKE these medications   acetaminophen 325 MG tablet Commonly known as: TYLENOL Take 1-2 tablets (325-650 mg total) by mouth every 6 (six) hours as needed for mild pain (pain score 1-3 or temp > 100.5).   ALPRAZolam 0.25 MG tablet Commonly known as: XANAX Take 0.25 mg by mouth at bedtime as needed for anxiety.   alum & mag hydroxide-simeth 200-200-20 MG/5ML suspension Commonly known as: MAALOX/MYLANTA Take 15 mLs by mouth at bedtime as needed for indigestion or heartburn.   atorvastatin 40 MG tablet Commonly known as: LIPITOR Take 40 mg by mouth at bedtime.   CALCIUM 600 + D PO Take 1 tablet by mouth daily.   citalopram 20 MG tablet Commonly known as: CELEXA Take 10 mg by mouth daily. am   clobetasol ointment 0.05 % Commonly  known as: TEMOVATE Apply 1 application topically 2 (two) times daily as needed (psoriasis).   docusate sodium 100 MG capsule Commonly known as: COLACE Take 1 capsule (100 mg total) by mouth 2 (two) times daily.   enoxaparin 40 MG/0.4ML injection Commonly known as: Lovenox Inject 0.4 mLs (40 mg total) into the skin daily for 14 days.   fluticasone 50 MCG/ACT nasal spray Commonly known as: FLONASE Place 1 spray into both nostrils daily as needed for allergies or rhinitis.   levothyroxine 50 MCG tablet Commonly known as: SYNTHROID Take 50 mcg by mouth daily before breakfast.   methocarbamol 500 MG tablet Commonly known as: ROBAXIN Take 1 tablet (500 mg total) by mouth every 6 (six) hours as needed for muscle spasms.   ondansetron 4 MG tablet Commonly known as:  ZOFRAN Take 1 tablet (4 mg total) by mouth every 6 (six) hours as needed for nausea.   oxyCODONE 5 MG immediate release tablet Commonly known as: Oxy IR/ROXICODONE Take 1-2 tablets (5-10 mg total) by mouth every 4 (four) hours as needed for moderate pain (pain score 4-6).   SYSTANE OP Place 1 drop into both eyes daily as needed (dry eyes).   traMADol 50 MG tablet Commonly known as: ULTRAM Take 1 tablet (50 mg total) by mouth every 6 (six) hours as needed. What changed:   when to take this  reasons to take this            Durable Medical Equipment  (From admission, onward)         Start     Ordered   11/29/20 1103  DME Walker rolling  Once       Question Answer Comment  Walker: With [place]   Patient needs a walker to treat with the following condition Status post total knee replacement using cement, left      11/29/20 1102   11/29/20 1103  DME 3 n 1  Once        11/29/20 1102   11/29/20 1103  DME Bedside commode  Once       Question:  Patient needs a bedside commode to treat with the following condition  Answer:  Status post total knee replacement using cement, left   11/29/20 1102           Diagnostic Studies: DG Knee 1-2 Views Left  Result Date: 11/29/2020 CLINICAL DATA:  Left knee arthroplasty EXAM: LEFT KNEE - 1-2 VIEW COMPARISON:  CT scan of the knee 12/26/2019 FINDINGS: Interval surgical changes of total knee arthroplasty. No evidence of hardware complication. Expected intra-articular gas and surgical staples. Alignment appears anatomic. IMPRESSION: Surgical changes of left total knee arthroplasty without evidence of immediate complication. Electronically Signed   By: Jacqulynn Cadet M.D.   On: 11/29/2020 10:12    Disposition: Discharge disposition: 06-Home-Health Care Svc          Follow-up Information    Duanne Guess, PA-C Follow up in 2 week(s).   Specialties: Orthopedic Surgery, Emergency Medicine Contact information: [place] [address] [phone]                Signed: Feliberto Gottron 12/01/2020, 5:54 AM

## 2020-11-30 NOTE — Plan of Care (Signed)

## 2020-11-30 NOTE — Progress Notes (Signed)
Physical Therapy Treatment Patient Details Name: Annette [name] MRN: [id number] DOB: Jan 16, 1956 Today's Date: 11/30/2020    History of Present Illness Pt is a 65 yo female s/p L TKA, WBAT. PMH of former smoker, hypothyrodism, graves disease.    PT Comments    Patient up in chair at start/end of session, reported 4-5/10 pain initially, after ambulation reported 6/10 in L knee. She was able to perform seated exercises with verbal cues and sit <> stand with RW and CGA, cued for hand placement ea time. She ambulated ~158ft with RW and CGA, decreased gait velocity noted with antalgic gait, but steady. Returned to chair in room with all needs in reach. The patient would benefit from further skilled PT intervention to continue to progress towards goals. Recommendation remains appropriate.     Follow Up Recommendations  Home health PT;Supervision for mobility/OOB     Equipment Recommendations  Rolling walker with 5" wheels    Recommendations for Other Services       Precautions / Restrictions Precautions Precautions: Fall;Knee Precaution Booklet Issued: No Restrictions Weight Bearing Restrictions: Yes LLE Weight Bearing: Weight bearing as tolerated    Mobility  Bed Mobility     General bed mobility comments: pt up in recliner at start/end of session    Transfers Overall transfer level: Needs assistance Equipment used: Rolling walker (2 wheeled) Transfers: Sit to/from Stand Sit to Stand: Min guard         General transfer comment: cued for hand placement ea attempt  Ambulation/Gait Ambulation/Gait assistance: Min guard Gait Distance (Feet): 120 Feet Assistive device: Rolling walker (2 wheeled)       General Gait Details: antalgic gait noted on LLE, able to weight  bear appropriately   Stairs             Wheelchair Mobility    Modified Rankin (Stroke Patients Only)       Balance Overall balance assessment: Needs assistance Sitting-balance support:  Feet supported Sitting balance-Leahy Scale: Normal     Standing balance support: Bilateral upper extremity supported;Single extremity supported Standing balance-Leahy Scale: Fair                              Cognition Arousal/Alertness: Awake/alert Behavior During Therapy: WFL for tasks assessed/performed Overall Cognitive Status: Within Functional Limits for tasks assessed                                        Exercises Total Joint Exercises Ankle Circles/Pumps: AROM;10 reps;Both Quad Sets: AROM;Strengthening;Left;10 reps Hip ABduction/ADduction: AROM;Strengthening;Left;10 reps Long Arc Quad: AAROM;Strengthening;Left;10 reps Other Exercises     General Comments General comments (skin integrity, edema, etc.): pt still nauseated but motivated to participate with therapy      Pertinent Vitals/Pain Pain Assessment: 0-10 Pain Score: 6  Pain Location: 4-5 to start, 6 by end of ambulation Pain Descriptors / Indicators: Aching;Sore;Guarding Pain Intervention(s): Limited activity within patient's tolerance;Monitored during session;Repositioned;Premedicated before session    Home Living Family/patient expects to be discharged to:: Private residence Living Arrangements: Spouse/significant other Available Help at Discharge: Family Type of Home: House Home Access: Stairs to enter Entrance Stairs-Rails: Right Home Layout: Two level;Able to live on main level with bedroom/bathroom Home Equipment: Gilford Rile - 2 wheels;Walker - 4 wheels;Shower seat - built in;Toilet riser      Prior Function Level of Independence: Independent  PT Goals (current goals can now be found in the care plan section) Acute Rehab PT Goals Patient Stated Goal: to go home Progress towards PT goals: Progressing toward goals    Frequency    BID      PT Plan Current plan remains appropriate    Co-evaluation              AM-PAC PT "6 Clicks" Mobility    Outcome Measure  Help needed turning from your back to your side while in a flat bed without using bedrails?: None Help needed moving from lying on your back to sitting on the side of a flat bed without using bedrails?: None Help needed moving to and from a bed to a chair (including a wheelchair)?: None Help needed standing up from a chair using your arms (e.g., wheelchair or bedside chair)?: A Little Help needed to walk in hospital room?: A Little Help needed climbing 3-5 steps with a railing? : A Little 6 Click Score: 21    End of Session Equipment Utilized During Treatment: Gait belt Activity Tolerance: Patient tolerated treatment well Patient left: in chair;with call bell/phone within reach;with chair alarm set Nurse Communication: Mobility status PT Visit Diagnosis: Other abnormalities of gait and mobility (R26.89);Muscle weakness (generalized) (M62.81);Difficulty in walking, not elsewhere classified (R26.2)     Time: 1694-5038 PT Time Calculation (min) (ACUTE ONLY): 15 min  Charges:  $Gait Training: 8-22 mins                    Lieutenant Diego PT, DPT 1:32 PM,11/30/20

## 2020-12-01 MED ORDER — BISACODYL 10 MG RE SUPP
10.0000 mg | Freq: Once | RECTAL | Status: AC
  Administered 2020-12-01: 10 mg via RECTAL
  Filled 2020-12-01: qty 1

## 2020-12-01 MED ORDER — MAGNESIUM HYDROXIDE 400 MG/5ML PO SUSP
30.0000 mL | Freq: Once | ORAL | Status: AC
  Administered 2020-12-01: 30 mL via ORAL
  Filled 2020-12-01: qty 30

## 2020-12-01 NOTE — Progress Notes (Signed)
Occupational Therapy Treatment Patient Details Name: Annette [name] MRN: [id number] DOB: [birth year] Today's Date: 12/01/2020    History of present illness Pt is a 65 yo female s/p L TKA, WBAT. PMH of former smoker, hypothyrodism, graves disease.   OT comments  Pt received in recliner, agreeable to OT tx. Spouse present and actively participating. Pt performed ADL transfers with CGA and PRN VC for hand placement/RW mgt to improve safety/independence. Pt amb ~10' to [facility] with RW for toileting. Set up and SBA for STS transfer for toileting hygiene. Pt/spouse further instructed in AE/DME, polar care mgt, compression stocking mgt, ADL transfers, and RW mgt in kitchen versus bathroom; pt and spouse verbalized understanding. Pt progressing well towards OT goals. Continues to benefit from skilled OT services while hospitalized.    Follow Up Recommendations  No OT follow up    Equipment Recommendations  None recommended by OT    Recommendations for Other Services      Precautions / Restrictions Precautions Precautions: Fall;Knee Restrictions Weight Bearing Restrictions: Yes LLE Weight Bearing: Weight bearing as tolerated       Mobility Bed Mobility               General bed mobility comments: pt up in recliner at start/end of session    Transfers Overall transfer level: Needs assistance Equipment used: Rolling walker (2 wheeled) Transfers: Sit to/from Stand Sit to Stand: Min guard              Balance Overall balance assessment: Needs assistance Sitting-balance support: Feet supported Sitting balance-Leahy Scale: Normal     Standing balance support: Bilateral upper extremity supported;Single extremity supported;During functional activity Standing balance-Leahy Scale: Fair                             ADL either performed or assessed with clinical judgement   ADL Overall ADL's : Needs assistance/impaired                     Lower Body  Dressing: Sit to/from stand;Minimal assistance Lower Body Dressing Details (indicate cue type and reason): cues for AE to improve independence, Min A to thread over small wound vac and cues for donning over LLE first Toilet Transfer: RW;Ambulation;BSC;Min Psychiatric nurse Details (indicate cue type and reason): cues for RW placement Toileting- Clothing Manipulation and Hygiene: Set up;Supervision/safety;Sit to/from stand               Vision Patient Visual Report: No change from baseline     Perception     Praxis      Cognition Arousal/Alertness: Awake/alert Behavior During Therapy: WFL for tasks assessed/performed Overall Cognitive Status: Within Functional Limits for tasks assessed                                          Exercises Other Exercises Other Exercises: Pt/spouse further instructed in AE/DME, polar care mgt, compression stocking mgt, ADL transfers, and RW mgt in kitchen versus bathroom; pt and spouse verbalized understanding   Shoulder Instructions       General Comments      Pertinent Vitals/ Pain       Pain Assessment: 0-10 Pain Score: 5  Pain Location: 5 with ADL transfers Pain Descriptors / Indicators: Aching;Sore;Guarding Pain Intervention(s): Limited activity within patient's tolerance;Monitored during session;Premedicated before session;Repositioned;Ice applied  Home Living                                          Prior Functioning/Environment              Frequency  Min 2X/week        Progress Toward Goals  OT Goals(current goals can now be found in the care plan section)  Progress towards OT goals: Progressing toward goals  Acute Rehab OT Goals Patient Stated Goal: to go home OT Goal Formulation: With patient Time For Goal Achievement: 12/14/20 Potential to Achieve Goals: Good  Plan Discharge plan remains appropriate;Frequency remains appropriate    Co-evaluation                  AM-PAC OT "6 Clicks" Daily Activity     Outcome Measure   Help from another person eating meals?: None Help from another person taking care of personal grooming?: None Help from another person toileting, which includes using toliet, bedpan, or urinal?: A Little Help from another person bathing (including washing, rinsing, drying)?: A Little Help from another person to put on and taking off regular upper body clothing?: None Help from another person to put on and taking off regular lower body clothing?: A Little 6 Click Score: 21    End of Session Equipment Utilized During Treatment: Gait belt;Rolling walker  OT Visit Diagnosis: Other abnormalities of gait and mobility (R26.89);Pain Pain - Right/Left: Left Pain - part of body: Knee   Activity Tolerance Patient tolerated treatment well   Patient Left in chair;with call bell/phone within reach;with family/visitor present;Other (comment) (polar care in place)   Nurse Communication          Time: [phone] OT Time Calculation (min): 24 min  Charges: OT General Charges $OT Visit: 1 Visit OT Treatments $Self Care/Home Management : 23-37 mins  Hanley Hays, MPH, MS, OTR/L ascom [phone] 12/01/20, 10:56 AM

## 2020-12-01 NOTE — TOC Transition Note (Signed)
Transition of Care [facility]) - CM/SW Discharge Note   Patient Details  Name: Annette [name] MRN: [id number] Date of Birth: 05-13-1956  Transition of Care [facility]) CM/SW Contact:  Harriet Masson, RN Phone Number: [phone] 12/01/2020, 10:11 AM   Clinical Narrative:    [facility]ed [facility] spoke with Helene Kelp on pt's discharge today for HHPT. Also spoke with the pt's spouse who was here today for transportation. Updates provided that [facility] has been contact to start services for [facility]. Spouse indicated medication was not called into the pharmacy. Pt currently received discharge instructions via bedside. Informed the spouse to mention to the nurse who can call the provider for paper prescription or to call into the preferred pharmacy.   No other issues or needs requested today as discharge summary completed. TOC remains available if any additional issues arise.   Final next level of care: [place] Barriers to Discharge: No Barriers Identified   Patient Goals and CMS Choice Patient states their goals for this hospitalization and ongoing recovery are:: heal quickly      Discharge Placement                  Name of family member notified: Phala Schraeder Patient and family notified of of transfer: 12/01/20  Discharge Plan and Services   Discharge Planning Services: CM Consult            DME Arranged: N/A         HH Arranged: PT [place] Agency: [facility]formerly [employer] Date [place]: 11/29/20 Time [place]: 1500 Representative spoke with at [place]: [place] Interventions     Readmission Risk Interventions No flowsheet data found.

## 2020-12-01 NOTE — Progress Notes (Signed)
Patient and husband given instructions to make follow up appointment, when to return for worsening symptoms, IV taken out, Lovenox education given, Tramadol given, & discharged with all belongings.

## 2020-12-01 NOTE — Progress Notes (Signed)
Physical Therapy Treatment Patient Details Name: Annette [name] MRN: [id number] DOB: 11/12/1955 Today's Date: 12/01/2020    History of Present Illness Pt is a 65 yo female s/p L TKA, WBAT. PMH of former smoker, hypothyrodism, graves disease.    PT Comments    Pt was sitting on BSC upon arriving. She agrees to PT session and is cooperative and motivated throughout. DC orders are in. Session focused on mobility and reviewing all safety concerns/questions about DC. She was easily able to stand and ambulate with RW. Overall pt is progressing well but will continue to benefit form skilled PT at DC to address deficits while assisting pt to PLOF. Both pt/supportive spouse feel confident and safe to DC home today. RN to go over DC instructions at conclusion of session.    Follow Up Recommendations  Home health PT;Supervision for mobility/OOB     Equipment Recommendations  None recommended by PT (pt reports having equipment needs met)       Precautions / Restrictions Precautions Precautions: Fall;Knee Precaution Booklet Issued: No Restrictions Weight Bearing Restrictions: Yes LLE Weight Bearing: Weight bearing as tolerated    Mobility  Bed Mobility Overal bed mobility: Modified Independent    General bed mobility comments: pt up in recliner at start/end of session    Transfers Overall transfer level: Needs assistance Equipment used: Rolling walker (2 wheeled) Transfers: Sit to/from Stand Sit to Stand: Supervision     Ambulation/Gait Ambulation/Gait assistance: Supervision Gait Distance (Feet): 100 Feet Assistive device: Rolling walker (2 wheeled) Gait Pattern/deviations: Step-to pattern Gait velocity: decreased   General Gait Details: no LOB or unsteadiness   Stairs  General stair comments: performed stairs previous date     Balance Overall balance assessment: Needs assistance Sitting-balance support: Feet supported Sitting balance-Leahy Scale: Normal      Standing balance support: Bilateral upper extremity supported;Single extremity supported;During functional activity Standing balance-Leahy Scale: Good       Cognition Arousal/Alertness: Awake/alert Behavior During Therapy: WFL for tasks assessed/performed Overall Cognitive Status: Within Functional Limits for tasks assessed      General Comments: Pt is A and O x 4      Exercises Other Exercises Other Exercises: Pt/spouse further instructed in AE/DME, polar care mgt, compression stocking mgt, ADL transfers, and RW mgt in kitchen versus bathroom; pt and spouse verbalized understanding    General Comments General comments (skin integrity, edema, etc.): Pt is being DC home. Reviewed all exercises, polar care, bone foam, and all expectation at home.      Pertinent Vitals/Pain Pain Assessment: No/denies pain Pain Score: 0-No pain Pain Location: 5 with ADL transfers Pain Descriptors / Indicators: Aching;Sore;Guarding Pain Intervention(s): Limited activity within patient's tolerance;Monitored during session;Premedicated before session;Repositioned           PT Goals (current goals can now be found in the care plan section) Acute Rehab PT Goals Patient Stated Goal: to go home Progress towards PT goals: Progressing toward goals    Frequency    BID      PT Plan Current plan remains appropriate       AM-PAC PT "6 Clicks" Mobility   Outcome Measure  Help needed turning from your back to your side while in a flat bed without using bedrails?: None Help needed moving from lying on your back to sitting on the side of a flat bed without using bedrails?: None Help needed moving to and from a bed to a chair (including a wheelchair)?: None Help needed standing up from a  chair using your arms (e.g., wheelchair or bedside chair)?: A Little Help needed to walk in hospital room?: A Little Help needed climbing 3-5 steps with a railing? : A Little 6 Click Score: 21    End of  Session Equipment Utilized During Treatment: Gait belt Activity Tolerance: Patient tolerated treatment well Patient left: in chair;with call bell/phone within reach;with chair alarm set;with family/visitor present Nurse Communication: Mobility status PT Visit Diagnosis: Other abnormalities of gait and mobility (R26.89);Muscle weakness (generalized) (M62.81);Difficulty in walking, not elsewhere classified (R26.2)     Time: 1130-1140 PT Time Calculation (min) (ACUTE ONLY): 10 min  Charges:  $Gait Training: 8-22 mins                     Julaine Fusi PTA 12/01/20, 12:21 PM

## 2020-12-01 NOTE — Progress Notes (Signed)
   Subjective: 2 Days Post-Op Procedure(s) (LRB): Left total knee arthroplasty - Rachelle Hora to Assist (Left) Patient reports pain as mild.  Tolerating pain medications well.  Pain better controlled today. Patient is well, and has had no acute complaints or problems.   Denies any CP, SOB, ABD pain. We will continue therapy today.  Plan is to go Home after hospital stay.  Objective: Vital signs in last 24 hours: Temp:  [98 F (36.7 C)-98.2 F (36.8 C)] 98.2 F (36.8 C) (04/09 0543) Pulse Rate:  [70-103] 103 (04/09 0543) Resp:  [15-19] 15 (04/09 0543) BP: (134-172)/(53-84) 163/76 (04/09 0543) SpO2:  [94 %-97 %] 96 % (04/09 0543)  Intake/Output from previous day: 04/08 0701 - 04/09 0700 In: 240 [P.O.:240] Out: 200 [Urine:200] Intake/Output this shift: No intake/output data recorded.  Recent Labs    11/29/20 1156 11/30/20 0454  HGB 12.8 12.1   Recent Labs    11/29/20 1156 11/30/20 0454  WBC 8.5 10.9*  RBC 3.93 3.65*  HCT 38.1 35.2*  PLT 233 294   Recent Labs    11/29/20 1156 11/30/20 0454  NA  --  136  K  --  4.3  CL  --  101  CO2  --  25  BUN  --  13  CREATININE 0.70 0.70  GLUCOSE  --  138*  CALCIUM  --  8.7*   No results for input(s): LABPT, INR in the last 72 hours.  EXAM General - Patient is Alert, Appropriate and Oriented Extremity - Neurovascular intact Sensation intact distally Intact pulses distally Dorsiflexion/Plantar flexion intact No cellulitis present Compartment soft Dressing - dressing C/D/I and no drainage, Praveena intact with no drainage Motor Function - intact, moving foot and toes well on exam.   Past Medical History:  Diagnosis Date  . Anemia    Jan slightly anemic  . Anxiety   . Arthritis    FINGERS, knees  . CAD (coronary artery disease)    had carotid endarterectomy  . Complication of anesthesia    nausea, headaches, achy legs  . Family history of adverse reaction to anesthesia    SISTERS BOTH GET NAUSEATED  .  Headache 05/2017   migraines - precipitated by extended use of celebrex/meloxicam  . History of 2019 novel coronavirus disease (COVID-19) 07/11/2020  . Hyperlipidemia   . Hypothyroidism 9/10   s/p ablation/ GRAVES DISEASE  . Neuromuscular disorder (HCC)    NUMBNESS IN HANDS OR LIPS OCCASIONALLY/ carpal tunnel  . Psoriasis     Assessment/Plan:   2 Days Post-Op Procedure(s) (LRB): Left total knee arthroplasty - Rachelle Hora to Assist (Left) Active Problems:   Status post total knee replacement using cement, left  Estimated body mass index is 33.4 kg/m as calculated from the following:   Height as of this encounter: 5' (1.524 m).   Weight as of this encounter: 77.6 kg. Advance diet Up with therapy  Pain well controlled, no complaints.  DC IV pain meds. Vital signs are stable Needs bowel movement -medications ordered Plan on discharge to home with home health PT today pending bowel movement and completion of PT goals.  Patient ambulated 120 feet yesterday and also completed stairs.  She is requesting to go home today.   DVT Prophylaxis - Lovenox, TED hose and SCDs Weight-Bearing as tolerated to left leg   T. Rachelle Hora, PA-C [place] 12/01/2020, 5:48 AM

## 2020-12-31 ENCOUNTER — Other Ambulatory Visit: Payer: Self-pay | Admitting: Orthopedic Surgery

## 2021-01-01 ENCOUNTER — Ambulatory Visit: Payer: Medicare Other | Admitting: Anesthesiology

## 2021-01-01 ENCOUNTER — Encounter
Admission: RE | Disposition: A | Discharge status: Home / Self Care | Payer: Self-pay | Source: Home / Self Care | Attending: Orthopedic Surgery

## 2021-01-01 ENCOUNTER — Encounter: Payer: Self-pay | Admitting: Orthopedic Surgery

## 2021-01-01 ENCOUNTER — Ambulatory Visit
Admission: RE | Admit: 2021-01-01 | Discharge: 2021-01-01 | Disposition: A | Discharge status: Home / Self Care | Payer: Medicare Other | Attending: Orthopedic Surgery | Admitting: Orthopedic Surgery

## 2021-01-01 ENCOUNTER — Other Ambulatory Visit: Payer: Self-pay

## 2021-01-01 DIAGNOSIS — M24662 Ankylosis, left knee: Secondary | ICD-10-CM | POA: Diagnosis not present

## 2021-01-01 DIAGNOSIS — Z8249 Family history of ischemic heart disease and other diseases of the circulatory system: Secondary | ICD-10-CM | POA: Insufficient documentation

## 2021-01-01 DIAGNOSIS — Z8261 Family history of arthritis: Secondary | ICD-10-CM | POA: Diagnosis not present

## 2021-01-01 DIAGNOSIS — Z886 Allergy status to analgesic agent status: Secondary | ICD-10-CM | POA: Insufficient documentation

## 2021-01-01 DIAGNOSIS — Z7982 Long term (current) use of aspirin: Secondary | ICD-10-CM | POA: Insufficient documentation

## 2021-01-01 DIAGNOSIS — Z880 Allergy status to penicillin: Secondary | ICD-10-CM | POA: Diagnosis not present

## 2021-01-01 DIAGNOSIS — Z8262 Family history of osteoporosis: Secondary | ICD-10-CM | POA: Diagnosis not present

## 2021-01-01 DIAGNOSIS — M9689 Other intraoperative and postprocedural complications and disorders of the musculoskeletal system: Secondary | ICD-10-CM | POA: Insufficient documentation

## 2021-01-01 DIAGNOSIS — Z833 Family history of diabetes mellitus: Secondary | ICD-10-CM | POA: Insufficient documentation

## 2021-01-01 DIAGNOSIS — Z888 Allergy status to other drugs, medicaments and biological substances status: Secondary | ICD-10-CM | POA: Diagnosis not present

## 2021-01-01 DIAGNOSIS — Z79899 Other long term (current) drug therapy: Secondary | ICD-10-CM | POA: Diagnosis not present

## 2021-01-01 DIAGNOSIS — Z803 Family history of malignant neoplasm of breast: Secondary | ICD-10-CM | POA: Insufficient documentation

## 2021-01-01 DIAGNOSIS — Z881 Allergy status to other antibiotic agents status: Secondary | ICD-10-CM | POA: Diagnosis not present

## 2021-01-01 DIAGNOSIS — Z7951 Long term (current) use of inhaled steroids: Secondary | ICD-10-CM | POA: Insufficient documentation

## 2021-01-01 DIAGNOSIS — Z8349 Family history of other endocrine, nutritional and metabolic diseases: Secondary | ICD-10-CM | POA: Insufficient documentation

## 2021-01-01 HISTORY — PX: KNEE CLOSED REDUCTION: SHX995

## 2021-01-01 SURGERY — MANIPULATION, KNEE, CLOSED
Anesthesia: General | Site: Knee | Laterality: Left

## 2021-01-01 MED ORDER — CHLORHEXIDINE GLUCONATE 0.12 % MT SOLN
15.0000 mL | Freq: Once | OROMUCOSAL | Status: AC
  Administered 2021-01-01: 15 mL via OROMUCOSAL

## 2021-01-01 MED ORDER — OXYCODONE HCL 5 MG/5ML PO SOLN: 5.0000 mg | Freq: Once | ORAL | Status: AC | PRN

## 2021-01-01 MED ORDER — OXYCODONE HCL 5 MG PO TABS
5.0000 mg | ORAL_TABLET | Freq: Once | ORAL | Status: AC | PRN
Start: 2021-01-01 — End: 2021-01-01
  Administered 2021-01-01: 5 mg via ORAL

## 2021-01-01 MED ORDER — METOCLOPRAMIDE HCL 5 MG/ML IJ SOLN: 5.0000 mg | Freq: Three times a day (TID) | INTRAMUSCULAR | Status: DC | PRN

## 2021-01-01 MED ORDER — PROPOFOL 500 MG/50ML IV EMUL
INTRAVENOUS | Status: DC | PRN
  Administered 2021-01-01: 40 ug via INTRAVENOUS
  Administered 2021-01-01: 110 ug via INTRAVENOUS
  Administered 2021-01-01: 50 ug via INTRAVENOUS

## 2021-01-01 MED ORDER — OXYCODONE HCL 5 MG PO TABS
ORAL_TABLET | ORAL | Status: AC
  Filled 2021-01-01: qty 1

## 2021-01-01 MED ORDER — FENTANYL CITRATE (PF) 100 MCG/2ML IJ SOLN
INTRAMUSCULAR | Status: AC
  Administered 2021-01-01: 25 ug via INTRAVENOUS
  Filled 2021-01-01: qty 2

## 2021-01-01 MED ORDER — ONDANSETRON HCL 4 MG/2ML IJ SOLN
4.0000 mg | Freq: Four times a day (QID) | INTRAMUSCULAR | Status: DC | PRN
Start: 2021-01-01 — End: 2021-01-01

## 2021-01-01 MED ORDER — MIDAZOLAM HCL 2 MG/2ML IJ SOLN
INTRAMUSCULAR | Status: AC
  Filled 2021-01-01: qty 2

## 2021-01-01 MED ORDER — ONDANSETRON HCL 4 MG PO TABS: 4.0000 mg | ORAL_TABLET | Freq: Four times a day (QID) | ORAL | Status: DC | PRN

## 2021-01-01 MED ORDER — FENTANYL CITRATE (PF) 100 MCG/2ML IJ SOLN
INTRAMUSCULAR | Status: AC
  Filled 2021-01-01: qty 2

## 2021-01-01 MED ORDER — LIDOCAINE HCL (CARDIAC) PF 100 MG/5ML IV SOSY
PREFILLED_SYRINGE | INTRAVENOUS | Status: DC | PRN
  Administered 2021-01-01: 40 mg via INTRAVENOUS

## 2021-01-01 MED ORDER — ORAL CARE MOUTH RINSE: 15.0000 mL | Freq: Once | OROMUCOSAL | Status: AC

## 2021-01-01 MED ORDER — ONDANSETRON HCL 4 MG/2ML IJ SOLN
4.0000 mg | Freq: Once | INTRAMUSCULAR | Status: DC | PRN
Start: 2021-01-01 — End: 2021-01-01

## 2021-01-01 MED ORDER — SODIUM CHLORIDE 0.9 % IV SOLN: INTRAVENOUS | Status: DC

## 2021-01-01 MED ORDER — LACTATED RINGERS IV SOLN: INTRAVENOUS | Status: DC

## 2021-01-01 MED ORDER — MIDAZOLAM HCL 2 MG/2ML IJ SOLN
INTRAMUSCULAR | Status: DC | PRN
  Administered 2021-01-01: 2 mg via INTRAVENOUS

## 2021-01-01 MED ORDER — METOCLOPRAMIDE HCL 10 MG PO TABS: 5.0000 mg | ORAL_TABLET | Freq: Three times a day (TID) | ORAL | Status: DC | PRN

## 2021-01-01 MED ORDER — FENTANYL CITRATE (PF) 100 MCG/2ML IJ SOLN
25.0000 ug | INTRAMUSCULAR | Status: DC | PRN
  Administered 2021-01-01: 25 ug via INTRAVENOUS

## 2021-01-01 SURGICAL SUPPLY — 2 items
KIT TURNOVER KIT A (KITS) ×2 IMPLANT
MANIFOLD NEPTUNE II (INSTRUMENTS) ×2 IMPLANT

## 2021-01-01 NOTE — Anesthesia Postprocedure Evaluation (Signed)
Anesthesia Post Note  Patient: Annette [name]  Procedure(s) Performed: CLOSED MANIPULATION KNEE (Left Knee)  Patient location during evaluation: PACU Anesthesia Type: General Level of consciousness: awake and alert Pain management: pain level controlled Vital Signs Assessment: post-procedure vital signs reviewed and stable Respiratory status: spontaneous breathing, nonlabored ventilation, respiratory function stable and patient connected to nasal cannula oxygen Cardiovascular status: blood pressure returned to baseline and stable Postop Assessment: no apparent nausea or vomiting Anesthetic complications: no   No complications documented.   Last Vitals:  Vitals:   01/01/21 1130 01/01/21 1147  BP: 137/68 139/62  Pulse: 72 69  Resp: 17 18  Temp: 36.9 C 37.2 C  SpO2: 96% 100%    Last Pain:  Vitals:   01/01/21 1147  TempSrc: Temporal  PainSc: 5                  Arita Miss

## 2021-01-01 NOTE — Transfer of Care (Cosign Needed)
Immediate Anesthesia Transfer of Care Note  Patient: Annette [name]  Procedure(s) Performed: CLOSED MANIPULATION KNEE (Left Knee)  Patient Location: PACU  Anesthesia Type:MAC  Level of Consciousness: drowsy and responds to stimulation  Airway & Oxygen Therapy: Patient Spontanous Breathing and Patient connected to face mask oxygen  Post-op Assessment: Report given to RN and Post -op Vital signs reviewed and stable  Post vital signs: Reviewed and stable  Last Vitals:  Vitals Value Taken Time  BP 138/62 01/01/21 1048  Temp    Pulse 58 01/01/21 1051  Resp 23 01/01/21 1051  SpO2 100 % 01/01/21 1051  Vitals shown include unvalidated device data.  Last Pain:  Vitals:   01/01/21 0858  TempSrc: Tympanic  PainSc: 2          Complications: No complications documented.

## 2021-01-01 NOTE — H&P (Signed)
Chief Complaint  Patient presents with  . Post Operative Visit  Left TKA 11/29/20 by Dr. Ernst Breach Annette [name] is a 65 y.o. female who presents today status post left total knee arthroplasty by Dr. Hessie Knows on 11/29/2020. Overall patient's pain is doing well. She is using 1-2 oxycodone tablets prior to therapy and occasionally during the day if she is experiencing pain. At times she will still use a tramadol tablet. Overall pain is improving but she has struggled significantly with range of motion with home health therapy and outpatient therapy. I discussed progress with outpatient physical therapist, patient is very stiff and unable to progress her flexion. Extension has made some progress. Physical therapist does not believe pain is a limiting factor.  Past Medical History: Past Medical History:  Diagnosis Date  . Anxiety  . Anxiety and depression  . Arthritis  . Elevated BP  . History of cataract  . Hyperlipidemia  . Hyperthyroidism, unspecified  Evaluated by Dr. Burke Keels at [facility] Endocr[facility]ology 2009; s/p I131 ablation 9/10.  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 Migraine  . Migraines 01/04/2014  . Osteoarthritis  . Psoriasis   Past Surgical History: Past Surgical History:  Procedure Laterality Date  . Arthroscopic partial medial meniscectomy Right 05/12/14  . ARTHROSCOPY KNEE Left 09/14/2015  . CAROTID ENDARTERECTOMY Right 06/24/2017  . CATARACT EXTRACTION Left 06/22/2018  . CESAREAN SECTION  x 2  . HYSTERECTOMY 03/2007  fibroids  . KNEE ARTHROSCOPY Left 09/14/2015  . OTHER SURGERY N/A 2010  THYROID ABLATION  . TUBAL LIGATION   Past Family History: Family History  Problem Relation Age of Onset  . Arthritis Mother  . Gout Mother  . Osteoporosis (Thinning of bones) Mother  . Coronary Artery Disease (Blocked arteries around heart) Mother  . Hyperlipidemia (Elevated cholesterol) Mother  . Hip fracture Mother  . High blood pressure (Hypertension)  Mother  . Myocardial Infarction (Heart attack) Father  . Colon polyps Father  . Diabetes type II Sister  . Breast cancer Sister  . Coronary Artery Disease (Blocked arteries around heart) Brother  . Coronary Artery Disease (Blocked arteries around heart) Brother   Medications: Current Outpatient Medications Ordered in Epic  Medication Sig Dispense Refill  . acetaminophen (TYLENOL) 325 MG tablet Take by mouth  . ALPRAZolam (XANAX) 0.25 MG tablet TAKE 1 TABLET (0.25 MG TOTAL) BY MOUTH ONCE DAILY AS NEEDED FOR SLEEP 45 tablet 1  . aspirin 81 MG EC tablet Take 81 mg by mouth once daily.  Marland Kitchen aspirin-acetaminophen-caffeine (EXCEDRIN MIGRAINE) 250-250-65 mg per tablet Take 1 tablet by mouth every 6 (six) hours as needed  . atorvastatin (LIPITOR) 40 MG tablet TAKE 1 TABLET BY MOUTH EVERY DAY 90 tablet 1  . citalopram (CELEXA) 20 MG tablet TAKE 1 TABLET BY MOUTH DAILY 90 tablet 1  . docusate (COLACE) 100 MG capsule Take 100 mg by mouth 2 (two) times daily (Patient not taking: Reported on 12/17/2020)  . fluticasone propionate (FLOVENT HFA) 110 mcg/actuation inhaler Inhale 1 inhalation into the lungs 2 (two) times daily 12 g 3  . levothyroxine (SYNTHROID) 50 MCG tablet TAKE 1 TABLET BY MOUTH EVERY DAY. WAIT 30 MINUTES BBEFORE TAKING OTHER MEDICATIONS 90 tablet 1  . methocarbamoL (ROBAXIN) 500 MG tablet Take by mouth  . multivitamin tablet Take 1 tablet by mouth once daily  . ondansetron (ZOFRAN) 4 MG tablet Take by mouth  . oxyCODONE (ROXICODONE) 5 MG immediate release tablet Take 1-2 tablets (5-10 mg total) by mouth  every 4 (four) hours as needed for Pain 40 tablet 0  . ULTRAVATE 0.05 % Lotn APPLY TO THE AFFECTED AREA TWICE DAILY 1   No current Epic-ordered facility-administered medications on file.   Allergies: Allergies  Allergen Reactions  . Celebrex [Celecoxib] Other (See Comments)  Aura migraines  . Clindamycin Nausea  . Doxycycline Headache  . Penicillins Unknown    Review of Systems:   A comprehensive 14 point ROS was performed, reviewed by me today, and the pertinent orthopaedic findings are documented in the HPI.  Exam: Wt 74.4 kg (164 lb)  LMP (LMP Unknown)  BMI 31.77 kg/m  General:  Well developed, well nourished, no apparent distress, normal affect, minimal antalgic gait with a cane  HEENT: Head normocephalic, atraumatic, PERRL.   Abdomen: Soft, non tender, non distended, Bowel sounds present.  Heart: Examination of the heart reveals regular, rate, and rhythm. There is no murmur noted on ascultation. There is a normal apical pulse.  Lungs: Lungs are clear to auscultation. There is no wheeze, rhonchi, or crackles. There is normal expansion of bilateral chest walls.   Left Lower Extremity: Examination of the left lower extremity shows mild swelling, incision site to be completely healed. No warmth erythema or drainage. Negative Homans' sign. No edema throughout the left leg. She has -5 degrees of extension and 45 to 50 degrees of flexion. Patella is tracking well. No Baker's cyst.  AP lateral sunrise views of the left knee are ordered interpreted by me in the office today. Impression: Patient has no evidence of acute bony abnormality. Patient has underwent left total knee arthroplasty with good placement of tibial and femoral components with no evidence of loosening, subsidence or periprosthetic fracture. Patella tracks well in the trochlear groove. No effusion.  Impression: Status post total left knee replacement [Z96.652] Status post total left knee replacement (primary encounter diagnosis) Arthrofibrosis of knee joint, left  Plan:  63. 65 year old female 4 weeks out from total knee arthroplasty. Left total knee is very stiff and has been unable to progress with knee flexion. Risks, benefits, complications of a left total knee manipulation have been discussed with the patient. Patient has agreed and consented procedure with Dr. Hessie Knows. We will schedule  manipulation for tomorrow 01/01/2021. This note was generated in part with voice recognition software and I apologize for any typographical errors that were not detected and corrected.  Feliberto Gottron MPA-C   Electronically signed by Feliberto Gottron, PA at 12/31/2020 9:14 AM EDT  Reviewed  H+P. No changes noted.

## 2021-01-01 NOTE — Anesthesia Preprocedure Evaluation (Signed)
Anesthesia Evaluation  Patient identified by MRN, date of birth, ID band Patient awake    Reviewed: Allergy & Precautions, H&P , NPO status , Patient's Chart, lab work & pertinent test results  History of Anesthesia Complications (+) Family history of anesthesia reaction and history of anesthetic complications  Airway Mallampati: II  TM Distance: <3 FB Neck ROM: full    Dental  (+) Teeth Intact, Implants   Pulmonary neg shortness of breath, Patient abstained from smoking.Not current smoker, former smoker,    Pulmonary exam normal breath sounds clear to auscultation       Cardiovascular Exercise Tolerance: Good (-) angina+ CAD  (-) Past MI Normal cardiovascular exam Rhythm:Regular Rate:Normal     Neuro/Psych  Headaches, PSYCHIATRIC DISORDERS Anxiety Depression  Neuromuscular disease    GI/Hepatic negative GI ROS, Neg liver ROS,   Endo/Other  Hypothyroidism   Renal/GU      Musculoskeletal  (+) Arthritis ,   Abdominal   Peds  Hematology negative hematology ROS (+)   Anesthesia Other Findings Past Medical History: No date: Anemia     Comment:  Jan slightly anemic No date: Anxiety No date: Arthritis     Comment:  FINGERS, knees No date: CAD (coronary artery disease)     Comment:  had carotid endarterectomy No date: Complication of anesthesia     Comment:  nausea, headaches, achy legs No date: Family history of adverse reaction to anesthesia     Comment:  SISTERS BOTH GET NAUSEATED 05/2017: Headache     Comment:  migraines - precipitated by extended use of               celebrex/meloxicam 07/11/2020: History of 2019 novel coronavirus disease (COVID-19) No date: Hyperlipidemia 9/10: Hypothyroidism     Comment:  s/p ablation/ GRAVES DISEASE No date: Neuromuscular disorder ([place])     Comment:  NUMBNESS IN HANDS OR LIPS OCCASIONALLY/ carpal tunnel No date: Psoriasis  Past Surgical History: 8/08: ABDOMINAL  HYSTERECTOMY 06/16/2018: CATARACT EXTRACTION W/PHACO; Left     Comment:  Procedure: CATARACT EXTRACTION PHACO AND INTRAOCULAR               LENS PLACEMENT ([place]) LEFT;  Surgeon: Leandrew Koyanagi, MD;  Location: [facility];  Service:               Ophthalmology;  Laterality: Left; 07/27/2019: CATARACT EXTRACTION W/PHACO; Right     Comment:  Procedure: CATARACT EXTRACTION PHACO AND INTRAOCULAR               LENS PLACEMENT ([facility]) RIGHT TECNIS ADD 6.17, 00:56.9,               27.1%;  Surgeon: Leandrew Koyanagi, MD;  Location:               [place];  Service: Ophthalmology;                Laterality: Right; No date: CESAREAN SECTION     Comment:  x2 10/20/2016: COLONOSCOPY WITH PROPOFOL; N/A     Comment:  Procedure: COLONOSCOPY WITH PROPOFOL;  Surgeon: Lucilla Lame, MD;  Location: [place];  Service:               Endoscopy;  Laterality: N/A; 01/11/2018: COLONOSCOPY WITH PROPOFOL; N/A     Comment:  Procedure: COLONOSCOPY WITH  PROPOFOL;  Surgeon: Lucilla Lame, MD;  Location: [place];  Service:               Endoscopy;  Laterality: N/A; 06/24/2017: ENDARTERECTOMY; Right     Comment:  Procedure: ENDARTERECTOMY CAROTID;  Surgeon: Algernon Huxley, MD;  Location: [facility];  Service: Vascular;                Laterality: Right; 09/14/2015: KNEE ARTHROSCOPY; Left     Comment:  Procedure: ARTHROSCOPY KNEE;  Surgeon: Leanor Kail,               MD;  Location: [place];  Service:               Orthopedics;  Laterality: Left; 05/12/14: KNEE ARTHROSCOPY W/ PARTIAL MEDIAL MENISCECTOMY; Right 10/20/2016: POLYPECTOMY     Comment:  Procedure: POLYPECTOMY;  Surgeon: Lucilla Lame, MD;                Location: [place];  Service: Endoscopy;; 01/11/2018: POLYPECTOMY     Comment:  Procedure: POLYPECTOMY;  Surgeon: Lucilla Lame, MD;                Location: [place];  Service:  Endoscopy;; 2010: THYROID SURGERY     Comment:  ablation  BMI    Body Mass Index: 33.40 kg/m      Reproductive/Obstetrics negative OB ROS                             Anesthesia Physical  Anesthesia Plan  ASA: II  Anesthesia Plan: General   Post-op Pain Management:    Induction: Intravenous  PONV Risk Score and Plan: 3 and Ondansetron, Propofol infusion and TIVA  Airway Management Planned: Natural Airway  Additional Equipment: None  Intra-op Plan:   Post-operative Plan:   Informed Consent: I have reviewed the patients History and Physical, chart, labs and discussed the procedure including the risks, benefits and alternatives for the proposed anesthesia with the patient or authorized representative who has indicated his/her understanding and acceptance.     Dental advisory given  Plan Discussed with: CRNA and Surgeon  Anesthesia Plan Comments: (Discussed risks of anesthesia with patient, including possibility of difficulty with spontaneous ventilation under anesthesia necessitating airway intervention, PONV, and rare risks such as cardiac or respiratory or neurological events. Patient understands.)        Anesthesia Quick Evaluation

## 2021-01-01 NOTE — Op Note (Signed)
01/01/2021  10:48 AM  PATIENT:  Annette [name]  65 y.o. 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  PROCEDURE:  Procedure(s): CLOSED MANIPULATION KNEE (Left)  SURGEON: Laurene Footman, MD  ASSISTANTS: None  ANESTHESIA:   MAC  EBL:  Total I/O In: 200 [I.V.:200] Out: -   BLOOD ADMINISTERED:none  DRAINS: none   LOCAL MEDICATIONS USED:  NONE  SPECIMEN:  No Specimen  DISPOSITION OF SPECIMEN:  N/A  COUNTS:  NO Closed procedure no count required  TOURNIQUET:  * No tourniquets in log *  IMPLANTS: None  DICTATION: .Dragon Dictation patient was brought to the operating room and after adequate IV sedation was given with moderate anesthesia appropriate patient identification timeout procedures were completed.  The leg initially had proximal range of motion of 7 to 35 degrees with gentle pressure with the knee hip in a flexed position the knee could gently be brought back to a maximum of 105 degrees with palpable catching and popping of adhesions within the knee at with just holding the leg it was at 90 degrees both applied pressure get back past 100.  Near full extension was also obtained patient are procedure well  PLAN OF CARE: Discharge to home after PACU  PATIENT DISPOSITION:  PACU - hemodynamically stable.

## 2021-01-01 NOTE — Discharge Instructions (Signed)
Work hard on bending your knee and getting it straight. Pain medicine as previously directed. Try using your Polar Care today and tomorrow to minimize swelling. Call office if having problems. Have therapy call me tomorrow so I can show you the range of motion we achieved today.  AMBULATORY SURGERY  DISCHARGE INSTRUCTIONS   1) The drugs that you were given will stay in your system until tomorrow so for the next 24 hours you should not:  A) Drive an automobile B) Make any legal decisions C) Drink any alcoholic beverage   2) You may resume regular meals tomorrow.  Today it is better to start with liquids and gradually work up to solid foods.  You may eat anything you prefer, but it is better to start with liquids, then soup and crackers, and gradually work up to solid foods.   3) Please notify your doctor immediately if you have any unusual bleeding, trouble breathing, redness and pain at the surgery site, drainage, fever, or pain not relieved by medication.    4) Additional Instructions:        Please contact your physician with any problems or Same Day Surgery at [phone], Monday through Friday 6 am to 4 pm, or [place] at [facility] number at [phone].

## 2021-01-02 ENCOUNTER — Encounter: Payer: Self-pay | Admitting: Orthopedic Surgery

## 2021-01-11 ENCOUNTER — Other Ambulatory Visit: Payer: Self-pay | Admitting: Orthopedic Surgery

## 2021-01-14 ENCOUNTER — Other Ambulatory Visit: Payer: Self-pay | Admitting: Orthopedic Surgery

## 2021-01-14 ENCOUNTER — Encounter
Admission: RE | Admit: 2021-01-14 | Discharge: 2021-01-14 | Disposition: A | Discharge status: Home / Self Care | Payer: Medicare Other | Source: Ambulatory Visit | Attending: Orthopedic Surgery | Admitting: Orthopedic Surgery

## 2021-01-14 HISTORY — DX: Nausea with vomiting, unspecified: R11.2

## 2021-01-14 HISTORY — DX: Other specified postprocedural states: Z98.890

## 2021-01-14 NOTE — Patient Instructions (Signed)
Your procedure is scheduled on:01-15-21 TUESDAY Report to the Registration Desk on the 1st floor of the [facility]. To find out your arrival time, please call [phone] between 1PM - 3PM on:01-14-21  REMEMBER: Instructions that are not followed completely may result in serious medical risk, up to and including death; or upon the discretion of your surgeon and anesthesiologist your surgery may need to be rescheduled.  Do not eat food after midnight the night before surgery.  No gum chewing, lozengers or hard candies.  You may however, drink CLEAR liquids up to 2 hours before you are scheduled to arrive for your surgery. Do not drink anything within 2 hours of your scheduled arrival time.  Clear liquids include: - water  - apple juice without pulp - gatorade - black coffee or tea (Do NOT add milk or creamers to the coffee or tea) Do NOT drink anything that is not on this list.  TAKE THESE MEDICATIONS THE MORNING OF SURGERY WITH A SIP OF WATER: -SYNTHROID (LEVOTHYROXINE) -OXYCODONE -CELEXA (ESCITALOPRAM)  Follow recommendations from Cardiologist, Pulmonologist or PCP regarding stopping Aspirin, Coumadin, Plavix, Eliquis, Pradaxa, or Pletal-DO NOT TAKE ASPIRIN-PT STATES SHE HAS ONLY BEEN TAKING ASPIRIN SPORADICALLY  One week prior to surgery: Stop Anti-inflammatories (NSAIDS) such as Advil, Aleve, Ibuprofen, Motrin, Naproxen, Naprosyn and Aspirin based products such as Excedrin, Goodys Powder, BC Powder-OK TO TAKE OXYCODONE/TYLENOL/TRAMADOL IF NEEDED  No Alcohol for 24 hours before or after surgery.  No Smoking including e-cigarettes for 24 hours prior to surgery.  No chewable tobacco products for at least 6 hours prior to surgery.  No nicotine patches on the day of surgery.  Do not use any "recreational" drugs for at least a week prior to your surgery.  Please be advised that the combination of cocaine and anesthesia may have negative outcomes, up to and including death. If you  test positive for cocaine, your surgery will be cancelled.  On the morning of surgery brush your teeth with toothpaste and water, you may rinse your mouth with mouthwash if you wish. Do not swallow any toothpaste or mouthwash.  Do not wear jewelry, make-up, hairpins, clips or nail polish.  Do not wear lotions, powders, or perfumes.   Do not shave body from the neck down 48 hours prior to surgery just in case you cut yourself which could leave a site for infection.  Also, freshly shaved skin may become irritated if using the CHG soap.  Contact lenses, hearing aids and dentures may not be worn into surgery.  Do not bring valuables to the hospital. [facility] is not responsible for any missing/lost belongings or valuables.   Notify your doctor if there is any change in your medical condition (cold, fever, infection).  Wear comfortable clothing (specific to your surgery type) to the hospital.  Plan for stool softeners for home use; pain medications have a tendency to cause constipation. You can also help prevent constipation by eating foods high in fiber such as fruits and vegetables and drinking plenty of fluids as your diet allows.  After surgery, you can help prevent lung complications by doing breathing exercises.  Take deep breaths and cough every 1-2 hours. Your doctor may order a device called an Incentive Spirometer to help you take deep breaths. When coughing or sneezing, hold a pillow firmly against your incision with both hands. This is called "splinting." Doing this helps protect your incision. It also decreases belly discomfort.  If you are being admitted to the hospital overnight,  leave your suitcase in the car. After surgery it may be brought to your room.  If you are being discharged the day of surgery, you will not be allowed to drive home. You will need a responsible adult (18 years or older) to drive you home and stay with you that night.   If you are taking public  transportation, you will need to have a responsible adult (18 years or older) with you. Please confirm with your physician that it is acceptable to use public transportation.   Please call the [facility] at [phone] if you have any questions about these instructions.  Surgery Visitation Policy:  Patients undergoing a surgery or procedure may have one family member or support person with them as long as that person is not COVID-19 positive or experiencing its symptoms.  That person may remain in the waiting area during the procedure.  Inpatient Visitation:    Visiting hours are 7 a.m. to 8 p.m. Inpatients will be allowed two visitors daily. The visitors may change each day during the patient's stay. No visitors under the age of 62. Any visitor under the age of 45 must be accompanied by an adult. The visitor must pass COVID-19 screenings, use hand sanitizer when entering and exiting the patient's room and wear a mask at all times, including in the patient's room. Patients must also wear a mask when staff or their visitor are in the room. Masking is required regardless of vaccination status.

## 2021-01-15 ENCOUNTER — Encounter: Payer: Self-pay | Admitting: Orthopedic Surgery

## 2021-01-15 ENCOUNTER — Encounter
Admission: RE | Disposition: A | Discharge status: Home / Self Care | Payer: Self-pay | Source: Home / Self Care | Attending: Orthopedic Surgery

## 2021-01-15 ENCOUNTER — Ambulatory Visit: Payer: Medicare Other | Admitting: Urgent Care

## 2021-01-15 ENCOUNTER — Ambulatory Visit
Admission: RE | Admit: 2021-01-15 | Discharge: 2021-01-15 | Disposition: A | Discharge status: Home / Self Care | Payer: Medicare Other | Attending: Orthopedic Surgery | Admitting: Orthopedic Surgery

## 2021-01-15 DIAGNOSIS — Z79899 Other long term (current) drug therapy: Secondary | ICD-10-CM | POA: Insufficient documentation

## 2021-01-15 DIAGNOSIS — Z886 Allergy status to analgesic agent status: Secondary | ICD-10-CM | POA: Diagnosis not present

## 2021-01-15 DIAGNOSIS — Z7982 Long term (current) use of aspirin: Secondary | ICD-10-CM | POA: Diagnosis not present

## 2021-01-15 DIAGNOSIS — Z96652 Presence of left artificial knee joint: Secondary | ICD-10-CM | POA: Diagnosis not present

## 2021-01-15 DIAGNOSIS — Z79890 Hormone replacement therapy (postmenopausal): Secondary | ICD-10-CM | POA: Insufficient documentation

## 2021-01-15 DIAGNOSIS — Z87891 Personal history of nicotine dependence: Secondary | ICD-10-CM | POA: Diagnosis not present

## 2021-01-15 DIAGNOSIS — M1712 Unilateral primary osteoarthritis, left knee: Secondary | ICD-10-CM | POA: Diagnosis present

## 2021-01-15 DIAGNOSIS — Z880 Allergy status to penicillin: Secondary | ICD-10-CM | POA: Diagnosis not present

## 2021-01-15 DIAGNOSIS — Z881 Allergy status to other antibiotic agents status: Secondary | ICD-10-CM | POA: Diagnosis not present

## 2021-01-15 DIAGNOSIS — M24662 Ankylosis, left knee: Secondary | ICD-10-CM | POA: Diagnosis not present

## 2021-01-15 DIAGNOSIS — Z888 Allergy status to other drugs, medicaments and biological substances status: Secondary | ICD-10-CM | POA: Diagnosis not present

## 2021-01-15 HISTORY — PX: KNEE ARTHROSCOPY: SHX127

## 2021-01-15 SURGERY — ARTHROSCOPY, KNEE
Anesthesia: General | Site: Knee | Laterality: Left

## 2021-01-15 MED ORDER — PROPOFOL 10 MG/ML IV BOLUS
INTRAVENOUS | Status: DC | PRN
  Administered 2021-01-15: 100 mg via INTRAVENOUS
  Administered 2021-01-15: 50 mg via INTRAVENOUS
  Administered 2021-01-15: 150 mg via INTRAVENOUS

## 2021-01-15 MED ORDER — OXYCODONE HCL 5 MG PO TABS
5.0000 mg | ORAL_TABLET | Freq: Once | ORAL | Status: AC | PRN
  Administered 2021-01-15: 5 mg via ORAL

## 2021-01-15 MED ORDER — LACTATED RINGERS IV SOLN: INTRAVENOUS | Status: DC

## 2021-01-15 MED ORDER — MIDAZOLAM HCL 2 MG/2ML IJ SOLN
INTRAMUSCULAR | Status: AC
  Filled 2021-01-15: qty 2

## 2021-01-15 MED ORDER — SODIUM CHLORIDE 0.9 % IV SOLN: INTRAVENOUS | Status: DC

## 2021-01-15 MED ORDER — METOCLOPRAMIDE HCL 10 MG PO TABS: 5.0000 mg | ORAL_TABLET | Freq: Three times a day (TID) | ORAL | Status: DC | PRN

## 2021-01-15 MED ORDER — OXYCODONE HCL 5 MG/5ML PO SOLN: 5.0000 mg | Freq: Once | ORAL | Status: AC | PRN

## 2021-01-15 MED ORDER — FENTANYL CITRATE (PF) 100 MCG/2ML IJ SOLN
INTRAMUSCULAR | Status: AC
  Filled 2021-01-15: qty 2

## 2021-01-15 MED ORDER — METOCLOPRAMIDE HCL 5 MG/ML IJ SOLN: 5.0000 mg | Freq: Three times a day (TID) | INTRAMUSCULAR | Status: DC | PRN

## 2021-01-15 MED ORDER — MIDAZOLAM HCL 2 MG/2ML IJ SOLN
INTRAMUSCULAR | Status: DC | PRN
  Administered 2021-01-15: 2 mg via INTRAVENOUS

## 2021-01-15 MED ORDER — CHLORHEXIDINE GLUCONATE 0.12 % MT SOLN
OROMUCOSAL | Status: AC
  Filled 2021-01-15: qty 15

## 2021-01-15 MED ORDER — FAMOTIDINE 20 MG PO TABS: 20.0000 mg | ORAL_TABLET | Freq: Once | ORAL | Status: DC

## 2021-01-15 MED ORDER — DEXAMETHASONE SODIUM PHOSPHATE 10 MG/ML IJ SOLN
INTRAMUSCULAR | Status: DC | PRN
  Administered 2021-01-15: 10 mg via INTRAVENOUS

## 2021-01-15 MED ORDER — CEFAZOLIN SODIUM-DEXTROSE 2-4 GM/100ML-% IV SOLN
2.0000 g | INTRAVENOUS | Status: AC
  Administered 2021-01-15: 2 g via INTRAVENOUS

## 2021-01-15 MED ORDER — KETAMINE HCL 10 MG/ML IJ SOLN
INTRAMUSCULAR | Status: DC | PRN
  Administered 2021-01-15: 20 mg via INTRAVENOUS

## 2021-01-15 MED ORDER — CEFAZOLIN SODIUM-DEXTROSE 2-4 GM/100ML-% IV SOLN: 2.0000 g | INTRAVENOUS | Status: DC

## 2021-01-15 MED ORDER — ONDANSETRON HCL 4 MG/2ML IJ SOLN: 4.0000 mg | Freq: Four times a day (QID) | INTRAMUSCULAR | Status: DC | PRN

## 2021-01-15 MED ORDER — EPHEDRINE SULFATE 50 MG/ML IJ SOLN
INTRAMUSCULAR | Status: DC | PRN
  Administered 2021-01-15: 5 mg via INTRAVENOUS
  Administered 2021-01-15: 10 mg via INTRAVENOUS
  Administered 2021-01-15: 5 mg via INTRAVENOUS
  Administered 2021-01-15: 10 mg via INTRAVENOUS

## 2021-01-15 MED ORDER — CEFAZOLIN SODIUM-DEXTROSE 2-4 GM/100ML-% IV SOLN
INTRAVENOUS | Status: AC
  Filled 2021-01-15: qty 100

## 2021-01-15 MED ORDER — PROPOFOL 10 MG/ML IV BOLUS
INTRAVENOUS | Status: AC
  Filled 2021-01-15: qty 40

## 2021-01-15 MED ORDER — ONDANSETRON HCL 4 MG/2ML IJ SOLN
INTRAMUSCULAR | Status: DC | PRN
  Administered 2021-01-15: 4 mg via INTRAVENOUS

## 2021-01-15 MED ORDER — FENTANYL CITRATE (PF) 100 MCG/2ML IJ SOLN
25.0000 ug | INTRAMUSCULAR | Status: DC | PRN
  Administered 2021-01-15 (×3): 25 ug via INTRAVENOUS

## 2021-01-15 MED ORDER — ONDANSETRON HCL 4 MG PO TABS: 4.0000 mg | ORAL_TABLET | Freq: Four times a day (QID) | ORAL | Status: DC | PRN

## 2021-01-15 MED ORDER — APREPITANT 40 MG PO CAPS
ORAL_CAPSULE | ORAL | Status: AC
  Filled 2021-01-15: qty 1

## 2021-01-15 MED ORDER — OXYCODONE HCL 5 MG PO TABS
ORAL_TABLET | ORAL | Status: AC
  Filled 2021-01-15: qty 1

## 2021-01-15 MED ORDER — CHLORHEXIDINE GLUCONATE 0.12 % MT SOLN: 15.0000 mL | Freq: Once | OROMUCOSAL | Status: DC

## 2021-01-15 MED ORDER — ORAL CARE MOUTH RINSE: 15.0000 mL | Freq: Once | OROMUCOSAL | Status: DC

## 2021-01-15 MED ORDER — FENTANYL CITRATE (PF) 100 MCG/2ML IJ SOLN
INTRAMUSCULAR | Status: AC
  Administered 2021-01-15: 25 ug via INTRAVENOUS
  Filled 2021-01-15: qty 2

## 2021-01-15 MED ORDER — KETAMINE HCL 50 MG/ML IJ SOLN
INTRAMUSCULAR | Status: AC
  Filled 2021-01-15: qty 1

## 2021-01-15 MED ORDER — KETOROLAC TROMETHAMINE 30 MG/ML IJ SOLN
INTRAMUSCULAR | Status: AC
  Administered 2021-01-15: 15 mg
  Filled 2021-01-15: qty 1

## 2021-01-15 MED ORDER — PROMETHAZINE HCL 25 MG/ML IJ SOLN: 6.2500 mg | INTRAMUSCULAR | Status: DC | PRN

## 2021-01-15 MED ORDER — APREPITANT 40 MG PO CAPS
40.0000 mg | ORAL_CAPSULE | Freq: Once | ORAL | Status: AC
  Administered 2021-01-15: 40 mg via ORAL

## 2021-01-15 MED ORDER — MEPERIDINE HCL 25 MG/ML IJ SOLN: 6.2500 mg | INTRAMUSCULAR | Status: DC | PRN

## 2021-01-15 MED ORDER — LIDOCAINE HCL (PF) 2 % IJ SOLN
INTRAMUSCULAR | Status: AC
  Filled 2021-01-15: qty 2

## 2021-01-15 MED ORDER — FENTANYL CITRATE (PF) 100 MCG/2ML IJ SOLN
INTRAMUSCULAR | Status: DC | PRN
  Administered 2021-01-15: 50 ug via INTRAVENOUS
  Administered 2021-01-15: 25 ug via INTRAVENOUS
  Administered 2021-01-15: 50 ug via INTRAVENOUS

## 2021-01-15 MED ORDER — KETOROLAC TROMETHAMINE 15 MG/ML IJ SOLN
15.0000 mg | INTRAMUSCULAR | Status: AC
  Administered 2021-01-15: 15 mg via INTRAVENOUS

## 2021-01-15 SURGICAL SUPPLY — 31 items
APL PRP STRL LF DISP 70% ISPRP (MISCELLANEOUS) ×1
BLADE INCISOR PLUS 4.5 (BLADE) ×1 IMPLANT
BNDG ELASTIC 4X5.8 VLCR STR LF (GAUZE/BANDAGES/DRESSINGS) IMPLANT
CHLORAPREP W/TINT 26 (MISCELLANEOUS) ×2 IMPLANT
COVER WAND RF STERILE (DRAPES) ×2 IMPLANT
CUFF TOURN SGL QUICK 24 (TOURNIQUET CUFF) ×2
CUFF TOURN SGL QUICK 34 (TOURNIQUET CUFF)
CUFF TRNQT CYL 24X4X16.5-23 (TOURNIQUET CUFF) IMPLANT
CUFF TRNQT CYL 34X4.125X (TOURNIQUET CUFF) IMPLANT
DRAPE C-ARMOR (DRAPES) IMPLANT
GAUZE SPONGE 4X4 12PLY STRL (GAUZE/BANDAGES/DRESSINGS) ×2 IMPLANT
GLOVE SURG SYN 9.0  PF PI (GLOVE) ×2
GLOVE SURG SYN 9.0 PF PI (GLOVE) ×1 IMPLANT
GOWN SRG 2XL LVL 4 RGLN SLV (GOWNS) ×1 IMPLANT
GOWN STRL NON-REIN 2XL LVL4 (GOWNS) ×2
GOWN STRL REUS W/ TWL LRG LVL3 (GOWN DISPOSABLE) ×2 IMPLANT
GOWN STRL REUS W/TWL LRG LVL3 (GOWN DISPOSABLE) ×4
IV LACTATED RINGER IRRG 3000ML (IV SOLUTION) ×4
IV LR IRRIG 3000ML ARTHROMATIC (IV SOLUTION) ×2 IMPLANT
KIT TURNOVER KIT A (KITS) ×2 IMPLANT
MANIFOLD NEPTUNE II (INSTRUMENTS) ×4 IMPLANT
NEEDLE HYPO 22GX1.5 SAFETY (NEEDLE) ×2 IMPLANT
PACK ARTHROSCOPY KNEE (MISCELLANEOUS) ×2 IMPLANT
SCALPEL PROTECTED #11 DISP (BLADE) ×2 IMPLANT
SET TUBE SUCT SHAVER OUTFL 24K (TUBING) ×2 IMPLANT
SET TUBE TIP INTRA-ARTICULAR (MISCELLANEOUS) ×2 IMPLANT
SUT ETHILON 4-0 (SUTURE) ×2
SUT ETHILON 4-0 FS2 18XMFL BLK (SUTURE) ×1
SUTURE ETHLN 4-0 FS2 18XMF BLK (SUTURE) ×1 IMPLANT
TUBING ARTHRO INFLOW-ONLY STRL (TUBING) ×2 IMPLANT
WAND COBLATION FLOW 50 (SURGICAL WAND) ×2 IMPLANT

## 2021-01-15 NOTE — Discharge Instructions (Signed)
Weightbearing as tolerated to the left leg. Keep dressing clean and dry and rewrap Ace wrap if it gets loose Start CPM today 0 to as much flexion as you can put up with for an hour at a time shoot for 8 hours a day Polar Care to your knee this week when not in CPM  Call office if you are having problems.  AMBULATORY SURGERY  DISCHARGE INSTRUCTIONS   1) The drugs that you were given will stay in your system until tomorrow so for the next 24 hours you should not:  A) Drive an automobile B) Make any legal decisions C) Drink any alcoholic beverage   2) You may resume regular meals tomorrow.  Today it is better to start with liquids and gradually work up to solid foods.  You may eat anything you prefer, but it is better to start with liquids, then soup and crackers, and gradually work up to solid foods.   3) Please notify your doctor immediately if you have any unusual bleeding, trouble breathing, redness and pain at the surgery site, drainage, fever, or pain not relieved by medication.    4) Additional Instructions:  May alternate tylenol 1000mg  with ibuprofen 800mg  every 8 hours.   Please contact your physician with any problems or Same Day Surgery at [phone], Monday through Friday 6 am to 4 pm, or [place] at [facility] number at [phone].

## 2021-01-15 NOTE — Anesthesia Preprocedure Evaluation (Signed)
Anesthesia Evaluation  Patient identified by MRN, date of birth, ID band Patient awake    Reviewed: Allergy & Precautions, NPO status , Patient's Chart, lab work & pertinent test results  History of Anesthesia Complications (+) PONV and history of anesthetic complications  Airway Mallampati: III  TM Distance: >3 FB Neck ROM: Full    Dental no notable dental hx.    Pulmonary neg sleep apnea, neg COPD, former smoker,    breath sounds clear to auscultation- rhonchi (-) wheezing      Cardiovascular Exercise Tolerance: Good (-) hypertension(-) CAD, (-) Past MI, (-) Cardiac Stents and (-) CABG  Rhythm:Regular Rate:Normal - Systolic murmurs and - Diastolic murmurs    Neuro/Psych  Headaches, neg Seizures PSYCHIATRIC DISORDERS Anxiety Depression    GI/Hepatic negative GI ROS, Neg liver ROS,   Endo/Other  neg diabetesHypothyroidism   Renal/GU negative Renal ROS     Musculoskeletal  (+) Arthritis ,   Abdominal (+) + obese,   Peds  Hematology  (+) anemia ,   Anesthesia Other Findings Past Medical History: No date: Anemia     Comment:  Jan slightly anemic No date: Anxiety No date: Arthritis     Comment:  FINGERS, knees No date: CAD (coronary artery disease)     Comment:  had carotid endarterectomy No date: Complication of anesthesia     Comment:  nausea, headaches, achy legs No date: Family history of adverse reaction to anesthesia     Comment:  SISTERS BOTH GET NAUSEATED 05/2017: Headache     Comment:  migraines - precipitated by extended use of               celebrex/meloxicam 07/11/2020: History of 2019 novel coronavirus disease (COVID-19) No date: Hyperlipidemia 9/10: Hypothyroidism     Comment:  s/p ablation/ GRAVES DISEASE No date: Neuromuscular disorder ([facility])     Comment:  NUMBNESS IN HANDS OR LIPS OCCASIONALLY/ carpal tunnel No date: PONV (postoperative nausea and vomiting)     Comment:  AFTER 11-29-20  SURGERY No date: Psoriasis   Reproductive/Obstetrics                             Anesthesia Physical Anesthesia Plan  ASA: II  Anesthesia Plan: General   Post-op Pain Management:    Induction: Intravenous  PONV Risk Score and Plan: 3 and Ondansetron, Dexamethasone, Midazolam and Aprepitant  Airway Management Planned: LMA  Additional Equipment:   Intra-op Plan:   Post-operative Plan:   Informed Consent: I have reviewed the patients History and Physical, chart, labs and discussed the procedure including the risks, benefits and alternatives for the proposed anesthesia with the patient or authorized representative who has indicated his/her understanding and acceptance.     Dental advisory given  Plan Discussed with: CRNA and Anesthesiologist  Anesthesia Plan Comments:         Anesthesia Quick Evaluation

## 2021-01-15 NOTE — Op Note (Signed)
01/15/2021  1:13 PM  PATIENT:  Annette [name]  65 y.o. 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.662  PROCEDURE:  Procedure(s): Left knee arthroscopy for arthrofibrosis, extensive synovectomy (Left)  SURGEON: Laurene Footman, MD  ASSISTANTS: None  ANESTHESIA:   general  EBL:  Total I/O In: 500 [I.V.:500] Out: 15 [Blood:15]  BLOOD ADMINISTERED:none  DRAINS: none   LOCAL MEDICATIONS USED:  NONE  SPECIMEN:  No Specimen  DISPOSITION OF SPECIMEN:  N/A  COUNTS:  YES  TOURNIQUET:   Total Tourniquet Time Documented: Thigh (Left) - 32 minutes Total: Thigh (Left) - 32 minutes   IMPLANTS: None  DICTATION: .Dragon Dictation patient was brought to the operating room and after adequate anesthesia was obtained the left leg was prepped and draped in the usual sterile fashion with a tourniquet to the upper thigh.  After patient identification timeout procedure completed inferior medial and inferior lateral portals were made and trocar placed to open up the joint and free of the space in the infrapatellar and suprapatellar spaces as well as get started on the gutters medial and lateral.  The arthroscope was then introduced and there is extensive scar tissue all around the suprapatellar pouch tightening that space this was subsequently debrided with a shaver and ArthroCare wand to open up that space and remove scar tissue the gutters were then opened up to recreate the space on the medial and lateral gutters again using shaver and wand.  Scar tissue around the notch was also removed with a shaver and wand until it no longer impinged on the plastic and metal joint.  At the start of the case range of motion was  approximately 15 to 30 degrees.  Looking at the knee with thin flexion there cannot see any additional bands that were stopping the need for motion but flexion was only 85 degrees at the close of the case.  There is no residual scarring in the infrapatellar region or thigh confined in the suprapatellar region gutters were freed and implants.  To be in appropriate position without evidence of loosening of the mechanical problem with the implant itself.  The lining of the knee had extensive scar with minimal synovitis.  The wounds were closed with simple interrupted 4-0 nylon followed by Xeroform 4 x 4's and an Ace wrap  PLAN OF CARE: Discharge to home after PACU  PATIENT DISPOSITION:  PACU - hemodynamically stable.

## 2021-01-15 NOTE — Anesthesia Postprocedure Evaluation (Signed)
Anesthesia Post Note  Patient: Annette [name]  Procedure(s) Performed: Left knee arthroscopy for arthrofibrosis, extensive synovectomy (Left Knee)  Patient location during evaluation: PACU Anesthesia Type: General Level of consciousness: awake and alert and oriented Pain management: pain level controlled Vital Signs Assessment: post-procedure vital signs reviewed and stable Respiratory status: spontaneous breathing, nonlabored ventilation and respiratory function stable Cardiovascular status: blood pressure returned to baseline and stable Postop Assessment: no signs of nausea or vomiting Anesthetic complications: no   No complications documented.   Last Vitals:  Vitals:   01/15/21 1430 01/15/21 1448  BP: (!) 151/64 (!) 158/71  Pulse: (!) 102 (!) 102  Resp: 12 16  Temp: 36.9 C 36.7 C  SpO2: 95% 99%    Last Pain:  Vitals:   01/15/21 1448  TempSrc: Oral  PainSc:                  Candida Vetter

## 2021-01-15 NOTE — Anesthesia Procedure Notes (Signed)
Procedure Name: LMA Insertion Date/Time: 01/15/2021 12:15 PM Performed by: Nelda Marseille, CRNA Pre-anesthesia Checklist: Patient identified, Patient being monitored, Timeout performed, Emergency Drugs available and Suction available Patient Re-evaluated:Patient Re-evaluated prior to induction Oxygen Delivery Method: Circle system utilized Preoxygenation: Pre-oxygenation with 100% oxygen Induction Type: IV induction Ventilation: Mask ventilation without difficulty LMA: LMA inserted LMA Size: 3.5 Tube type: Oral Number of attempts: 1 Placement Confirmation: positive ETCO2 and breath sounds checked- equal and bilateral Tube secured with: Tape Dental Injury: Teeth and Oropharynx as per pre-operative assessment

## 2021-01-15 NOTE — H&P (Signed)
History of the Present Illness: Annette [name] is a 65 y.o. female here today.   The patient presents for follow-up evaluation status post left total knee arthroplasty preformed on 11/22/2020. She had a knee manipulation on 01/01/2021, and has been working with physical therapy since. She has gone back to where she was prior to manipulation, and it is very stiff. I talked to therapy, and they feel like she is trying. X-ray obtained today.  The patient states she has a lot of pain. She states she has always had problems with her left knee, but it has worsened since she had surgery. She locates her pain to the posterior aspect of her left knee. She states she has tightness in her left hamstring tendon. She notes she has been exercising. The patient states she has used an ice pack every night since she had surgery, and she uses it during the day as well. She states also uses a heating pad. She states she will need a refill on her oxycodone and tramadol.  The patient is not diabetic.  I have reviewed past medical, surgical, social and family history, and allergies as documented in the EMR.  Past Medical History: Past Medical History:  Diagnosis Date  . Anxiety  . Anxiety and depression  . Arthritis  . Elevated BP  . History of cataract  . Hyperlipidemia  . Hyperthyroidism, unspecified  Evaluated by Dr. Burke Keels at [facility] 2009; s/p I131 ablation 9/10.  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 Migraine  . Migraines 01/04/2014  . Osteoarthritis  . Psoriasis   Past Surgical History: Past Surgical History:  Procedure Laterality Date  . Arthroscopic partial medial meniscectomy Right 05/12/2014  . ARTHROSCOPY KNEE Left 09/14/2015  . CAROTID ENDARTERECTOMY Right 06/24/2017  . CATARACT EXTRACTION Left 06/22/2018  . CESAREAN SECTION  x 2  . HYSTERECTOMY 03/2007  fibroids  . JOINT REPLACEMENT Left 11/22/2020  Total Knee Arthroplasty  . KNEE ARTHROSCOPY Left  09/14/2015  . OTHER SURGERY N/A 2010  THYROID ABLATION  . TUBAL LIGATION   Past Family History: Family History  Problem Relation Age of Onset  . Arthritis Mother  . Gout Mother  . Osteoporosis (Thinning of bones) Mother  . Coronary Artery Disease (Blocked arteries around heart) Mother  . Hyperlipidemia (Elevated cholesterol) Mother  . Hip fracture Mother  . High blood pressure (Hypertension) Mother  . Myocardial Infarction (Heart attack) Father  . Colon polyps Father  . Diabetes type II Sister  . Breast cancer Sister  . Coronary Artery Disease (Blocked arteries around heart) Brother  . Coronary Artery Disease (Blocked arteries around heart) Brother   Medications: Current Outpatient Medications Ordered in Epic  Medication Sig Dispense Refill  . acetaminophen (TYLENOL) 325 MG tablet Take by mouth  . ALPRAZolam (XANAX) 0.25 MG tablet TAKE 1 TABLET (0.25 MG TOTAL) BY MOUTH ONCE DAILY AS NEEDED FOR SLEEP 45 tablet 1  . aspirin 81 MG EC tablet Take 81 mg by mouth once daily.  Marland Kitchen aspirin-acetaminophen-caffeine (EXCEDRIN MIGRAINE) 250-250-65 mg per tablet Take 1 tablet by mouth every 6 (six) hours as needed  . atorvastatin (LIPITOR) 40 MG tablet TAKE 1 TABLET BY MOUTH EVERY DAY 90 tablet 1  . citalopram (CELEXA) 20 MG tablet TAKE 1 TABLET BY MOUTH DAILY 90 tablet 1  . levothyroxine (SYNTHROID) 50 MCG tablet TAKE 1 TABLET BY MOUTH EVERY DAY. WAIT 30 MINUTES BBEFORE TAKING OTHER MEDICATIONS 90 tablet 1  . multivitamin tablet Take 1 tablet by mouth once  daily  . ondansetron (ZOFRAN) 4 MG tablet Take by mouth  . oxyCODONE (ROXICODONE) 5 MG immediate release tablet Take 1-2 tablets (5-10 mg total) by mouth every 4 (four) hours as needed for Pain 40 tablet 0  . ULTRAVATE 0.05 % Lotn APPLY TO THE AFFECTED AREA TWICE DAILY 1  . methylPREDNISolone (MEDROL DOSEPACK) 4 mg tablet Follow package directions. 21 tablet 0  . traMADoL (ULTRAM) 50 mg tablet Take 1 tablet (50 mg total) by mouth every 6  (six) hours as needed for Pain 50 tablet 1   No current Epic-ordered facility-administered medications on file.   Allergies: Allergies  Allergen Reactions  . Celebrex [Celecoxib] Other (See Comments)  Aura migraines  . Clindamycin Nausea  . Doxycycline Headache  . Penicillins Unknown    Body mass index is 31.33 kg/m.  Review of Systems: A comprehensive 14 point ROS was performed, reviewed, and the pertinent orthopaedic findings are documented in the HPI.  There were no vitals filed for this visit.   General Physical Examination:   General/Constitutional: No apparent distress: well-nourished and well developed. Eyes: Pupils equal, round with synchronous movement. Lungs: Clear to auscultation HEENT: Normal Vascular: No edema, swelling or tenderness, except as noted in detailed exam. Cardiac: Heart rate and rhythm is regular. Integumentary: No impressive skin lesions present, except as noted in detailed exam. Neuro/Psych: Normal mood and affect, oriented to person, place and time.  Musculoskeletal Examination:  On exam, left knee stiffness. She has range of motion of 20 to 40 degrees with very stiff patella with passive range of motion incisions are well-healed no evidence of infection  Radiographs:  AP, lateral, standing, and sunrise x-rays of the left knee were ordered and personally reviewed today. These show well aligned total knee with no periprosthetic complication.  Assessment: ICD-10-CM  1. 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 Primary osteoarthritis of left knee M17.12  3. 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  We discussed the patient's x-ray findings. I explained there is nothing mechanically stopping her. I recommend left knee arthroscopy. I explained the surgery and postoperative course in detail. The patient was electronically  prescribed prednisone to take after surgery to keep her from getting as much inflammation. The patient was also electronically prescribed a refill of her oxycodone and tramadol. We will try to get her a CPM machine as well. I think his CPM could keep this from recurring help we can get that immediately postoperatively  We will schedule the patient for left knee arthroscopy in the near future.  Surgical Risks:  The nature of the condition and the proposed procedure has been reviewed in detail with the patient. Surgical versus non-surgical options and prognosis for recovery have been reviewed and the inherent risks and benefits of each have been discussed including the risks of infection, bleeding, injury to nerves/blood vessels/tendons, incomplete relief of symptoms, persisting pain and/or stiffness, loss of function, complex regional pain syndrome, failure of the procedure, as appropriate.  Attestation: I, Dawn Royse, am documenting for [employer], MD utilizing [place].    Electronically signed by Lauris Poag, MD at 01/11/2021 7:56 PM EDT  Reviewed  H+P. No changes noted.

## 2021-01-15 NOTE — Transfer of Care (Signed)
Immediate Anesthesia Transfer of Care Note  Patient: Annette [name]  Procedure(s) Performed: Left knee arthroscopy for arthrofibrosis, extensive synovectomy (Left Knee)  Patient Location: PACU  Anesthesia Type:General  Level of Consciousness: sedated  Airway & Oxygen Therapy: Patient Spontanous Breathing and Patient connected to face mask oxygen  Post-op Assessment: Report given to RN and Post -op Vital signs reviewed and stable  Post vital signs: Reviewed and stable  Last Vitals:  Vitals Value Taken Time  BP 121/55 01/15/21 1313  Temp    Pulse 80 01/15/21 1315  Resp 16 01/15/21 1315  SpO2 98 % 01/15/21 1315  Vitals shown include unvalidated device data.  Last Pain:  Vitals:   01/15/21 1056  TempSrc: Temporal  PainSc: 2          Complications: No complications documented.

## 2021-01-16 ENCOUNTER — Encounter: Payer: Self-pay | Admitting: Orthopedic Surgery

## 2021-02-15 ENCOUNTER — Other Ambulatory Visit: Payer: Self-pay | Admitting: Orthopedic Surgery

## 2021-02-19 ENCOUNTER — Encounter (HOSPITAL_COMMUNITY): Payer: Self-pay | Admitting: Urgent Care

## 2021-02-19 ENCOUNTER — Other Ambulatory Visit: Payer: Self-pay

## 2021-02-19 ENCOUNTER — Other Ambulatory Visit
Admission: RE | Admit: 2021-02-19 | Discharge: 2021-02-19 | Disposition: A | Discharge status: Home / Self Care | Payer: Medicare Other | Source: Ambulatory Visit | Attending: Orthopedic Surgery | Admitting: Orthopedic Surgery

## 2021-02-19 DIAGNOSIS — Z20822 Contact with and (suspected) exposure to covid-19: Secondary | ICD-10-CM | POA: Insufficient documentation

## 2021-02-19 DIAGNOSIS — Z01818 Encounter for other preprocedural examination: Secondary | ICD-10-CM | POA: Diagnosis present

## 2021-02-19 DIAGNOSIS — Z01810 Encounter for preprocedural cardiovascular examination: Secondary | ICD-10-CM | POA: Diagnosis not present

## 2021-02-19 LAB — COMPREHENSIVE METABOLIC PANEL
ALT: 20 U/L (ref 0–44)
AST: 18 U/L (ref 15–41)
Albumin: 3.9 g/dL (ref 3.5–5.0)
Alkaline Phosphatase: 104 U/L (ref 38–126)
Anion gap: 6 (ref 5–15)
BUN: 19 mg/dL (ref 8–23)
CO2: 30 mmol/L (ref 22–32)
Calcium: 9.3 mg/dL (ref 8.9–10.3)
Chloride: 102 mmol/L (ref 98–111)
Creatinine, Ser: 0.81 mg/dL (ref 0.44–1.00)
GFR, Estimated: >60 mL/min (ref >60)
Glucose, Bld: 105 mg/dL — ABNORMAL HIGH (ref 70–99)
Potassium: 4.4 mmol/L (ref 3.5–5.1)
Sodium: 138 mmol/L (ref 135–145)
Total Bilirubin: 0.6 mg/dL (ref 0.3–1.2)
Total Protein: 6.7 g/dL (ref 6.5–8.1)

## 2021-02-19 LAB — CBC WITH DIFFERENTIAL/PLATELET
Abs Immature Granulocytes: 0.02 10*3/uL (ref 0.00–0.07)
Basophils Absolute: 0 10*3/uL (ref 0.0–0.1)
Basophils Relative: 1 %
Eosinophils Absolute: 0.2 10*3/uL (ref 0.0–0.5)
Eosinophils Relative: 3 %
HCT: 38 % (ref 36.0–46.0)
Hemoglobin: 13 g/dL (ref 12.0–15.0)
Immature Granulocytes: 0 %
Lymphocytes Relative: 18 %
Lymphs Abs: 1.3 10*3/uL (ref 0.7–4.0)
MCH: 31.5 pg (ref 26.0–34.0)
MCHC: 34.2 g/dL (ref 30.0–36.0)
MCV: 92 fL (ref 80.0–100.0)
Monocytes Absolute: 0.6 10*3/uL (ref 0.1–1.0)
Monocytes Relative: 8 %
Neutro Abs: 5.1 10*3/uL (ref 1.7–7.7)
Neutrophils Relative %: 70 %
Platelets: 265 10*3/uL (ref 150–400)
RBC: 4.13 MIL/uL (ref 3.87–5.11)
RDW: 13.5 % (ref 11.5–15.5)
WBC: 7.2 10*3/uL (ref 4.0–10.5)
nRBC: 0 % (ref 0.0–0.2)

## 2021-02-19 LAB — URINALYSIS, ROUTINE W REFLEX MICROSCOPIC
Bilirubin Urine: NEGATIVE
Glucose, UA: NEGATIVE mg/dL
Hgb urine dipstick: NEGATIVE
Ketones, ur: NEGATIVE mg/dL
Nitrite: NEGATIVE
Protein, ur: NEGATIVE mg/dL
Specific Gravity, Urine: 1.018 (ref 1.005–1.030)
pH: 5 (ref 5.0–8.0)

## 2021-02-19 LAB — TYPE AND SCREEN
ABO/RH(D): A POS
Antibody Screen: NEGATIVE

## 2021-02-19 LAB — SARS CORONAVIRUS 2 (TAT 6-24 HRS): SARS Coronavirus 2: NEGATIVE

## 2021-02-19 LAB — SURGICAL PCR SCREEN

## 2021-02-19 NOTE — Patient Instructions (Addendum)
Your procedure is scheduled on: February 21, 2021 THURSDAY Report to the Registration Desk on the 1st floor of the [facility]. To find out your arrival time, please call [phone] between 1PM - 3PM on: February 20, 2021 [facility]  REMEMBER: Instructions that are not followed completely may result in serious medical risk, up to and including death; or upon the discretion of your surgeon and anesthesiologist your surgery may need to be rescheduled.  Do not eat food after midnight the night before surgery.  No gum chewing, lozengers or hard candies.  You may however, drink CLEAR liquids up to 2 hours before you are scheduled to arrive for your surgery. Do not drink anything within 2 hours of your scheduled arrival time.  Clear liquids include: - water  - apple juice without pulp - gatorade (not RED, PURPLE, OR BLUE) - black coffee or tea (Do NOT add milk or creamers to the coffee or tea) Do NOT drink anything that is not on this list.  Type 1 and Type 2 diabetics should only drink water.  In addition, your doctor has ordered for you to drink the provided  Ensure Pre-Surgery Clear Carbohydrate Drink  Drinking this carbohydrate drink up to two hours before surgery helps to reduce insulin resistance and improve patient outcomes. Please complete drinking 2 hours prior to scheduled arrival time.  TAKE THESE MEDICATIONS THE MORNING OF SURGERY WITH A SIP OF WATER: LEVOTHYROXINE  CITALOPRAM  USE FLONASE MORNING OF SURGERY  One week prior to surgery: Stop Anti-inflammatories (NSAIDS) such as Advil, Aleve, Ibuprofen, Motrin, Naproxen, Naprosyn and ASPIRIN OR Aspirin based products such as Excedrin, Goodys Powder, BC Powder AND MELOXICAM Stop ANY OVER THE COUNTER supplements until after surgery. You may however, continue to take Tylenol if needed for pain up until the day of surgery.  No Alcohol for 24 hours before or after surgery.  No Smoking including e-cigarettes for 24 hours prior to  surgery.  No chewable tobacco products for at least 6 hours prior to surgery.  No nicotine patches on the day of surgery.  Do not use any "recreational" drugs for at least a week prior to your surgery.  Please be advised that the combination of cocaine and anesthesia may have negative outcomes, up to and including death. If you test positive for cocaine, your surgery will be cancelled.  On the morning of surgery brush your teeth with toothpaste and water, you may rinse your mouth with mouthwash if you wish. Do not swallow any toothpaste or mouthwash.  Do not wear jewelry, make-up, hairpins, clips or nail polish.  Do not wear lotions, powders, or perfumes.   Do not shave body from the neck down 48 hours prior to surgery just in case you cut yourself which could leave a site for infection.  Also, freshly shaved skin may become irritated if using the CHG soap.  Contact lenses, hearing aids and dentures may not be worn into surgery.  Do not bring valuables to the hospital. [facility] is not responsible for any missing/lost belongings or valuables.   Use CHG Soap as directed on instruction sheet.  Notify your doctor if there is any change in your medical condition (cold, fever, infection).  Wear comfortable clothing (specific to your surgery type) to the hospital.  After surgery, you can help prevent lung complications by doing breathing exercises.  Take deep breaths and cough every 1-2 hours. Your doctor may order a device called an Incentive Spirometer to help you take deep  breaths. When coughing or sneezing, hold a pillow firmly against your incision with both hands. This is called "splinting." Doing this helps protect your incision. It also decreases belly discomfort.  If you are being admitted to the hospital overnight. YOU MAY BRING A SMALL BAG WITH YOU TO [place]   If you are being discharged the day of surgery, you will not be allowed to drive home. You will need a  responsible adult (18 years or older) to drive you home and stay with you that night.   If you are taking public transportation, you will need to have a responsible adult (18 years or older) with you. Please confirm with your physician that it is acceptable to use public transportation.   Please call the [facility]. at [phone] if you have any questions about these instructions.  Surgery Visitation Policy:  Patients undergoing a surgery or procedure may have one family member or support person with them as long as that person is not COVID-19 positive or experiencing its symptoms.  That person may remain in the waiting area during the procedure.  Inpatient Visitation:    Visiting hours are 7 a.m. to 8 p.m. Inpatients will be allowed two visitors daily. The visitors may change each day during the patient's stay. No visitors under the age of 62. Any visitor under the age of 31 must be accompanied by an adult. The visitor must pass COVID-19 screenings, use hand sanitizer when entering and exiting the patient's room and wear a mask at all times, including in the patient's room. Patients must also wear a mask when staff or their visitor are in the room. Masking is required regardless of vaccination status.

## 2021-02-20 ENCOUNTER — Other Ambulatory Visit
Admission: RE | Admit: 2021-02-20 | Discharge: 2021-02-20 | Disposition: A | Discharge status: Home / Self Care | Payer: Medicare Other | Source: Ambulatory Visit | Attending: Orthopedic Surgery | Admitting: Orthopedic Surgery

## 2021-02-20 DIAGNOSIS — Z01812 Encounter for preprocedural laboratory examination: Secondary | ICD-10-CM | POA: Insufficient documentation

## 2021-02-20 LAB — SURGICAL PCR SCREEN
MRSA, PCR: NEGATIVE
Staphylococcus aureus: POSITIVE — AB

## 2021-02-21 MED ORDER — ORAL CARE MOUTH RINSE: 15.0000 mL | Freq: Once | OROMUCOSAL | Status: DC

## 2021-02-21 MED ORDER — LACTATED RINGERS IV SOLN: INTRAVENOUS | Status: DC

## 2021-02-21 MED ORDER — CHLORHEXIDINE GLUCONATE 0.12 % MT SOLN: 15.0000 mL | Freq: Once | OROMUCOSAL | Status: DC

## 2021-02-25 MED ORDER — CEFAZOLIN SODIUM-DEXTROSE 2-4 GM/100ML-% IV SOLN: 2.0000 g | INTRAVENOUS | Status: DC

## 2021-02-25 MED ORDER — FAMOTIDINE 20 MG PO TABS: 20.0000 mg | ORAL_TABLET | Freq: Once | ORAL | Status: DC

## 2021-02-26 ENCOUNTER — Encounter: Admission: RE | Payer: Self-pay | Source: Home / Self Care

## 2021-02-26 ENCOUNTER — Inpatient Hospital Stay: Admission: RE | Admit: 2021-02-26 | Payer: Medicare Other | Source: Home / Self Care | Admitting: Orthopedic Surgery

## 2021-02-26 SURGERY — TOTAL KNEE REVISION
Anesthesia: Choice | Site: Knee | Laterality: Left

## 2021-02-28 ENCOUNTER — Other Ambulatory Visit: Payer: Self-pay | Admitting: Orthopedic Surgery

## 2021-03-08 ENCOUNTER — Other Ambulatory Visit
Admission: RE | Admit: 2021-03-08 | Discharge: 2021-03-08 | Disposition: A | Discharge status: Home / Self Care | Payer: Medicare Other | Source: Ambulatory Visit | Attending: Orthopedic Surgery | Admitting: Orthopedic Surgery

## 2021-03-08 ENCOUNTER — Other Ambulatory Visit: Payer: Self-pay

## 2021-03-08 DIAGNOSIS — Z0183 Encounter for blood typing: Secondary | ICD-10-CM | POA: Insufficient documentation

## 2021-03-08 LAB — TYPE AND SCREEN
ABO/RH(D): A POS
Antibody Screen: NEGATIVE

## 2021-03-11 MED ORDER — CEFAZOLIN SODIUM-DEXTROSE 2-4 GM/100ML-% IV SOLN: 2.0000 g | INTRAVENOUS | Status: DC

## 2021-03-11 MED ORDER — FAMOTIDINE 20 MG PO TABS
20.0000 mg | ORAL_TABLET | Freq: Once | ORAL | Status: AC
  Administered 2021-03-12: 20 mg via ORAL

## 2021-03-11 MED ORDER — LACTATED RINGERS IV SOLN: INTRAVENOUS | Status: DC

## 2021-03-12 ENCOUNTER — Inpatient Hospital Stay
Admission: RE | Admit: 2021-03-12 | Discharge: 2021-03-14 | DRG: 468 | Disposition: A | Discharge status: Home Health | Payer: Medicare Other | Attending: Orthopedic Surgery | Admitting: Orthopedic Surgery

## 2021-03-12 ENCOUNTER — Other Ambulatory Visit: Payer: Self-pay

## 2021-03-12 ENCOUNTER — Encounter: Payer: Self-pay | Admitting: Orthopedic Surgery

## 2021-03-12 ENCOUNTER — Inpatient Hospital Stay: Payer: Medicare Other

## 2021-03-12 ENCOUNTER — Encounter
Admission: RE | Disposition: A | Discharge status: Home Health | Payer: Self-pay | Source: Home / Self Care | Attending: Orthopedic Surgery

## 2021-03-12 DIAGNOSIS — Z79899 Other long term (current) drug therapy: Secondary | ICD-10-CM

## 2021-03-12 DIAGNOSIS — Z833 Family history of diabetes mellitus: Secondary | ICD-10-CM | POA: Diagnosis not present

## 2021-03-12 DIAGNOSIS — I2510 Atherosclerotic heart disease of native coronary artery without angina pectoris: Secondary | ICD-10-CM | POA: Diagnosis present

## 2021-03-12 DIAGNOSIS — Z9842 Cataract extraction status, left eye: Secondary | ICD-10-CM | POA: Diagnosis not present

## 2021-03-12 DIAGNOSIS — Z8616 Personal history of COVID-19: Secondary | ICD-10-CM

## 2021-03-12 DIAGNOSIS — E669 Obesity, unspecified: Secondary | ICD-10-CM | POA: Diagnosis present

## 2021-03-12 DIAGNOSIS — Z6831 Body mass index (BMI) 31.0-31.9, adult: Secondary | ICD-10-CM | POA: Diagnosis not present

## 2021-03-12 DIAGNOSIS — Z8371 Family history of colonic polyps: Secondary | ICD-10-CM | POA: Diagnosis not present

## 2021-03-12 DIAGNOSIS — Z7982 Long term (current) use of aspirin: Secondary | ICD-10-CM

## 2021-03-12 DIAGNOSIS — T8482XA Fibrosis due to internal orthopedic prosthetic devices, implants and grafts, initial encounter: Secondary | ICD-10-CM | POA: Diagnosis present

## 2021-03-12 DIAGNOSIS — Z8249 Family history of ischemic heart disease and other diseases of the circulatory system: Secondary | ICD-10-CM

## 2021-03-12 DIAGNOSIS — G8918 Other acute postprocedural pain: Secondary | ICD-10-CM

## 2021-03-12 DIAGNOSIS — Z90710 Acquired absence of both cervix and uterus: Secondary | ICD-10-CM

## 2021-03-12 DIAGNOSIS — E890 Postprocedural hypothyroidism: Secondary | ICD-10-CM | POA: Diagnosis present

## 2021-03-12 DIAGNOSIS — Z8262 Family history of osteoporosis: Secondary | ICD-10-CM | POA: Diagnosis not present

## 2021-03-12 DIAGNOSIS — E785 Hyperlipidemia, unspecified: Secondary | ICD-10-CM | POA: Diagnosis present

## 2021-03-12 DIAGNOSIS — Z79890 Hormone replacement therapy (postmenopausal): Secondary | ICD-10-CM | POA: Diagnosis not present

## 2021-03-12 DIAGNOSIS — Z803 Family history of malignant neoplasm of breast: Secondary | ICD-10-CM | POA: Diagnosis not present

## 2021-03-12 DIAGNOSIS — Z791 Long term (current) use of non-steroidal anti-inflammatories (NSAID): Secondary | ICD-10-CM | POA: Diagnosis not present

## 2021-03-12 DIAGNOSIS — Z8261 Family history of arthritis: Secondary | ICD-10-CM

## 2021-03-12 DIAGNOSIS — Z96652 Presence of left artificial knee joint: Secondary | ICD-10-CM

## 2021-03-12 DIAGNOSIS — Y792 Prosthetic and other implants, materials and accessory orthopedic devices associated with adverse incidents: Secondary | ICD-10-CM | POA: Diagnosis present

## 2021-03-12 HISTORY — PX: TOTAL KNEE REVISION: SHX996

## 2021-03-12 LAB — CBC
HCT: 39.1 % (ref 36.0–46.0)
Hemoglobin: 13.1 g/dL (ref 12.0–15.0)
MCH: 31 pg (ref 26.0–34.0)
MCHC: 33.5 g/dL (ref 30.0–36.0)
MCV: 92.7 fL (ref 80.0–100.0)
Platelets: 282 10*3/uL (ref 150–400)
RBC: 4.22 MIL/uL (ref 3.87–5.11)
RDW: 13.8 % (ref 11.5–15.5)
WBC: 14.1 10*3/uL — ABNORMAL HIGH (ref 4.0–10.5)
nRBC: 0 % (ref 0.0–0.2)

## 2021-03-12 LAB — CREATININE, SERUM
Creatinine, Ser: 0.66 mg/dL (ref 0.44–1.00)
GFR, Estimated: >60 mL/min (ref >60)

## 2021-03-12 SURGERY — TOTAL KNEE REVISION
Anesthesia: Spinal | Site: Knee | Laterality: Left

## 2021-03-12 MED ORDER — CHLORHEXIDINE GLUCONATE 0.12 % MT SOLN
15.0000 mL | Freq: Once | OROMUCOSAL | Status: AC
  Administered 2021-03-12: 15 mL via OROMUCOSAL

## 2021-03-12 MED ORDER — METHOCARBAMOL 1000 MG/10ML IJ SOLN
500.0000 mg | Freq: Four times a day (QID) | INTRAVENOUS | Status: DC | PRN
  Filled 2021-03-12: qty 5

## 2021-03-12 MED ORDER — METHOCARBAMOL 500 MG PO TABS
500.0000 mg | ORAL_TABLET | Freq: Four times a day (QID) | ORAL | Status: DC | PRN
  Administered 2021-03-14: 500 mg via ORAL
  Filled 2021-03-12: qty 1

## 2021-03-12 MED ORDER — TRAMADOL HCL 50 MG PO TABS
50.0000 mg | ORAL_TABLET | Freq: Four times a day (QID) | ORAL | Status: DC
Start: 2021-03-12 — End: 2021-03-14
  Administered 2021-03-12 – 2021-03-14 (×8): 50 mg via ORAL
  Filled 2021-03-12 (×8): qty 1

## 2021-03-12 MED ORDER — APREPITANT 40 MG PO CAPS
ORAL_CAPSULE | ORAL | Status: AC
  Filled 2021-03-12: qty 1

## 2021-03-12 MED ORDER — SODIUM CHLORIDE FLUSH 0.9 % IV SOLN
INTRAVENOUS | Status: AC
  Filled 2021-03-12: qty 40

## 2021-03-12 MED ORDER — SODIUM CHLORIDE 0.9 % IV SOLN
INTRAVENOUS | Status: DC | PRN
  Administered 2021-03-12: 60 mL

## 2021-03-12 MED ORDER — MORPHINE SULFATE (PF) 10 MG/ML IV SOLN
INTRAVENOUS | Status: DC | PRN
  Administered 2021-03-12: 10 mg

## 2021-03-12 MED ORDER — 0.9 % SODIUM CHLORIDE (POUR BTL) OPTIME
TOPICAL | Status: DC | PRN
  Administered 2021-03-12: 1000 mL

## 2021-03-12 MED ORDER — PROPOFOL 1000 MG/100ML IV EMUL
INTRAVENOUS | Status: AC
  Filled 2021-03-12: qty 100

## 2021-03-12 MED ORDER — PROPOFOL 10 MG/ML IV BOLUS
INTRAVENOUS | Status: AC
  Filled 2021-03-12: qty 20

## 2021-03-12 MED ORDER — MIDAZOLAM HCL 2 MG/2ML IJ SOLN
INTRAMUSCULAR | Status: AC
  Filled 2021-03-12: qty 2

## 2021-03-12 MED ORDER — ALUM & MAG HYDROXIDE-SIMETH 200-200-20 MG/5ML PO SUSP: 30.0000 mL | ORAL | Status: DC | PRN

## 2021-03-12 MED ORDER — BUPIVACAINE-EPINEPHRINE (PF) 0.25% -1:200000 IJ SOLN
INTRAMUSCULAR | Status: AC
  Filled 2021-03-12: qty 30

## 2021-03-12 MED ORDER — METOCLOPRAMIDE HCL 10 MG PO TABS: 5.0000 mg | ORAL_TABLET | Freq: Three times a day (TID) | ORAL | Status: DC | PRN

## 2021-03-12 MED ORDER — FENTANYL CITRATE (PF) 100 MCG/2ML IJ SOLN
INTRAMUSCULAR | Status: DC | PRN
  Administered 2021-03-12 (×4): 25 ug via INTRAVENOUS

## 2021-03-12 MED ORDER — ATORVASTATIN CALCIUM 20 MG PO TABS
40.0000 mg | ORAL_TABLET | Freq: Every day | ORAL | Status: DC
  Administered 2021-03-12 – 2021-03-13 (×2): 40 mg via ORAL
  Filled 2021-03-12 (×2): qty 2

## 2021-03-12 MED ORDER — PHENYLEPHRINE HCL (PRESSORS) 10 MG/ML IV SOLN
INTRAVENOUS | Status: AC
  Filled 2021-03-12: qty 1

## 2021-03-12 MED ORDER — KETAMINE HCL 10 MG/ML IJ SOLN
INTRAMUSCULAR | Status: DC | PRN
  Administered 2021-03-12: 20 mg via INTRAVENOUS

## 2021-03-12 MED ORDER — BUPIVACAINE LIPOSOME 1.3 % IJ SUSP
INTRAMUSCULAR | Status: AC
  Filled 2021-03-12: qty 20

## 2021-03-12 MED ORDER — ONDANSETRON HCL 4 MG/2ML IJ SOLN: 4.0000 mg | Freq: Once | INTRAMUSCULAR | Status: DC | PRN

## 2021-03-12 MED ORDER — FLUTICASONE PROPIONATE 50 MCG/ACT NA SUSP
1.0000 | Freq: Every day | NASAL | Status: DC | PRN
  Filled 2021-03-12: qty 16

## 2021-03-12 MED ORDER — MAGNESIUM CITRATE PO SOLN
1.0000 | Freq: Once | ORAL | Status: DC | PRN
  Filled 2021-03-12: qty 296

## 2021-03-12 MED ORDER — DOCUSATE SODIUM 100 MG PO CAPS
100.0000 mg | ORAL_CAPSULE | Freq: Two times a day (BID) | ORAL | Status: DC
  Administered 2021-03-12 – 2021-03-14 (×4): 100 mg via ORAL
  Filled 2021-03-12 (×4): qty 1

## 2021-03-12 MED ORDER — PANTOPRAZOLE SODIUM 40 MG PO TBEC
40.0000 mg | DELAYED_RELEASE_TABLET | Freq: Every day | ORAL | Status: DC
  Administered 2021-03-12 – 2021-03-14 (×3): 40 mg via ORAL
  Filled 2021-03-12 (×3): qty 1

## 2021-03-12 MED ORDER — CLOBETASOL PROPIONATE 0.05 % EX OINT
1.0000 "application " | TOPICAL_OINTMENT | Freq: Two times a day (BID) | CUTANEOUS | Status: DC | PRN
  Filled 2021-03-12: qty 15

## 2021-03-12 MED ORDER — CITALOPRAM HYDROBROMIDE 10 MG PO TABS
10.0000 mg | ORAL_TABLET | ORAL | Status: DC
  Administered 2021-03-13 – 2021-03-14 (×2): 10 mg via ORAL
  Filled 2021-03-12 (×2): qty 1

## 2021-03-12 MED ORDER — PROPOFOL 10 MG/ML IV BOLUS
INTRAVENOUS | Status: DC | PRN
Start: 2021-03-12 — End: 2021-03-12
  Administered 2021-03-12: 40 mg via INTRAVENOUS

## 2021-03-12 MED ORDER — DEXAMETHASONE SODIUM PHOSPHATE 10 MG/ML IJ SOLN
INTRAMUSCULAR | Status: DC | PRN
  Administered 2021-03-12: 10 mg via INTRAVENOUS

## 2021-03-12 MED ORDER — VANCOMYCIN HCL 1000 MG IV SOLR
INTRAVENOUS | Status: AC
  Filled 2021-03-12: qty 1000

## 2021-03-12 MED ORDER — POLYETHYL GLYCOL-PROPYL GLYCOL 0.4-0.3 % OP GEL: Freq: Every day | OPHTHALMIC | Status: DC | PRN

## 2021-03-12 MED ORDER — PROPOFOL 500 MG/50ML IV EMUL
INTRAVENOUS | Status: DC | PRN
  Administered 2021-03-12: 75 ug/kg/min via INTRAVENOUS

## 2021-03-12 MED ORDER — CLINDAMYCIN PHOSPHATE 900 MG/50ML IV SOLN
INTRAVENOUS | Status: AC
  Filled 2021-03-12: qty 50

## 2021-03-12 MED ORDER — CLINDAMYCIN PHOSPHATE 900 MG/50ML IV SOLN
900.0000 mg | Freq: Four times a day (QID) | INTRAVENOUS | Status: AC
  Administered 2021-03-12 (×2): 900 mg via INTRAVENOUS
  Filled 2021-03-12 (×3): qty 50

## 2021-03-12 MED ORDER — KETAMINE HCL 50 MG/5ML IJ SOSY
PREFILLED_SYRINGE | INTRAMUSCULAR | Status: AC
  Filled 2021-03-12: qty 5

## 2021-03-12 MED ORDER — LEVOTHYROXINE SODIUM 50 MCG PO TABS
50.0000 ug | ORAL_TABLET | Freq: Every day | ORAL | Status: DC
  Administered 2021-03-13 – 2021-03-14 (×2): 50 ug via ORAL
  Filled 2021-03-12 (×2): qty 1

## 2021-03-12 MED ORDER — FENTANYL CITRATE (PF) 100 MCG/2ML IJ SOLN
INTRAMUSCULAR | Status: AC
  Filled 2021-03-12: qty 2

## 2021-03-12 MED ORDER — MENTHOL 3 MG MT LOZG
1.0000 | LOZENGE | OROMUCOSAL | Status: DC | PRN
  Filled 2021-03-12: qty 9

## 2021-03-12 MED ORDER — BUPIVACAINE-EPINEPHRINE 0.25% -1:200000 IJ SOLN
INTRAMUSCULAR | Status: DC | PRN
  Administered 2021-03-12: 30 mL

## 2021-03-12 MED ORDER — SODIUM CHLORIDE 0.9 % IV SOLN
INTRAVENOUS | Status: DC | PRN
  Administered 2021-03-12: 30 ug/min via INTRAVENOUS

## 2021-03-12 MED ORDER — DIPHENHYDRAMINE HCL 12.5 MG/5ML PO ELIX: 12.5000 mg | ORAL_SOLUTION | ORAL | Status: DC | PRN

## 2021-03-12 MED ORDER — SODIUM CHLORIDE 0.9 % IV SOLN: INTRAVENOUS | Status: DC

## 2021-03-12 MED ORDER — ALPRAZOLAM 0.25 MG PO TABS
0.2500 mg | ORAL_TABLET | Freq: Every day | ORAL | Status: DC
  Administered 2021-03-12 – 2021-03-13 (×2): 0.25 mg via ORAL
  Filled 2021-03-12 (×2): qty 1

## 2021-03-12 MED ORDER — POLYVINYL ALCOHOL 1.4 % OP SOLN
1.0000 [drp] | OPHTHALMIC | Status: DC | PRN
  Filled 2021-03-12: qty 15

## 2021-03-12 MED ORDER — METOCLOPRAMIDE HCL 5 MG/ML IJ SOLN: 5.0000 mg | Freq: Three times a day (TID) | INTRAMUSCULAR | Status: DC | PRN

## 2021-03-12 MED ORDER — ACETAMINOPHEN 325 MG PO TABS
325.0000 mg | ORAL_TABLET | Freq: Four times a day (QID) | ORAL | Status: DC | PRN
  Administered 2021-03-14: 650 mg via ORAL
  Filled 2021-03-12: qty 2

## 2021-03-12 MED ORDER — ONDANSETRON HCL 4 MG/2ML IJ SOLN: 4.0000 mg | Freq: Four times a day (QID) | INTRAMUSCULAR | Status: DC | PRN

## 2021-03-12 MED ORDER — POLYETHYLENE GLYCOL 3350 17 G PO PACK
17.0000 g | PACK | Freq: Every day | ORAL | Status: DC | PRN
  Administered 2021-03-13: 17 g via ORAL
  Filled 2021-03-12: qty 1

## 2021-03-12 MED ORDER — FENTANYL CITRATE (PF) 100 MCG/2ML IJ SOLN: 25.0000 ug | INTRAMUSCULAR | Status: DC | PRN

## 2021-03-12 MED ORDER — ACETAMINOPHEN 500 MG PO TABS
1000.0000 mg | ORAL_TABLET | Freq: Four times a day (QID) | ORAL | Status: AC
Start: 2021-03-12 — End: 2021-03-13
  Administered 2021-03-12 – 2021-03-13 (×4): 1000 mg via ORAL
  Filled 2021-03-12 (×4): qty 2

## 2021-03-12 MED ORDER — OXYCODONE HCL 5 MG PO TABS: 10.0000 mg | ORAL_TABLET | ORAL | Status: DC | PRN

## 2021-03-12 MED ORDER — NEOMYCIN-POLYMYXIN B GU 40-200000 IR SOLN
Status: AC
  Filled 2021-03-12: qty 1

## 2021-03-12 MED ORDER — ASPIRIN EC 81 MG PO TBEC
81.0000 mg | DELAYED_RELEASE_TABLET | ORAL | Status: DC
  Administered 2021-03-14: 81 mg via ORAL
  Filled 2021-03-12: qty 1

## 2021-03-12 MED ORDER — ZOLPIDEM TARTRATE 5 MG PO TABS: 5.0000 mg | ORAL_TABLET | Freq: Every evening | ORAL | Status: DC | PRN

## 2021-03-12 MED ORDER — BUPIVACAINE HCL (PF) 0.5 % IJ SOLN
INTRAMUSCULAR | Status: DC | PRN
  Administered 2021-03-12: 3 mL via INTRATHECAL

## 2021-03-12 MED ORDER — DEXMEDETOMIDINE (PRECEDEX) IN NS 20 MCG/5ML (4 MCG/ML) IV SYRINGE
PREFILLED_SYRINGE | INTRAVENOUS | Status: DC | PRN
  Administered 2021-03-12: 8 ug via INTRAVENOUS

## 2021-03-12 MED ORDER — ONDANSETRON HCL 4 MG PO TABS: 4.0000 mg | ORAL_TABLET | Freq: Four times a day (QID) | ORAL | Status: DC | PRN

## 2021-03-12 MED ORDER — MORPHINE SULFATE (PF) 10 MG/ML IV SOLN
INTRAVENOUS | Status: AC
  Filled 2021-03-12: qty 1

## 2021-03-12 MED ORDER — PHENOL 1.4 % MT LIQD
1.0000 | OROMUCOSAL | Status: DC | PRN
  Filled 2021-03-12: qty 177

## 2021-03-12 MED ORDER — OXYCODONE HCL 5 MG PO TABS
5.0000 mg | ORAL_TABLET | ORAL | Status: DC | PRN
  Administered 2021-03-14: 5 mg via ORAL
  Filled 2021-03-12: qty 1

## 2021-03-12 MED ORDER — EPHEDRINE SULFATE 50 MG/ML IJ SOLN
INTRAMUSCULAR | Status: DC | PRN
  Administered 2021-03-12: 10 mg via INTRAVENOUS

## 2021-03-12 MED ORDER — ASPIRIN-ACETAMINOPHEN-CAFFEINE 250-250-65 MG PO TABS
2.0000 | ORAL_TABLET | Freq: Four times a day (QID) | ORAL | Status: DC | PRN
  Filled 2021-03-12: qty 2

## 2021-03-12 MED ORDER — ONDANSETRON HCL 4 MG/2ML IJ SOLN
INTRAMUSCULAR | Status: AC
  Filled 2021-03-12: qty 2

## 2021-03-12 MED ORDER — ALUM & MAG HYDROXIDE-SIMETH 200-200-20 MG/5ML PO SUSP: 15.0000 mL | Freq: Every evening | ORAL | Status: DC | PRN

## 2021-03-12 MED ORDER — CLINDAMYCIN PHOSPHATE 900 MG/50ML IV SOLN
INTRAVENOUS | Status: DC | PRN
  Administered 2021-03-12: 900 mg via INTRAVENOUS

## 2021-03-12 MED ORDER — EPHEDRINE 5 MG/ML INJ
INTRAVENOUS | Status: AC
  Filled 2021-03-12: qty 10

## 2021-03-12 MED ORDER — MIDAZOLAM HCL 5 MG/5ML IJ SOLN
INTRAMUSCULAR | Status: DC | PRN
  Administered 2021-03-12: 2 mg via INTRAVENOUS

## 2021-03-12 MED ORDER — ONDANSETRON HCL 4 MG/2ML IJ SOLN
INTRAMUSCULAR | Status: DC | PRN
  Administered 2021-03-12: 4 mg via INTRAVENOUS

## 2021-03-12 MED ORDER — APREPITANT 40 MG PO CAPS
40.0000 mg | ORAL_CAPSULE | Freq: Once | ORAL | Status: AC
  Administered 2021-03-12: 40 mg via ORAL

## 2021-03-12 MED ORDER — ENOXAPARIN SODIUM 30 MG/0.3ML IJ SOSY
30.0000 mg | PREFILLED_SYRINGE | Freq: Two times a day (BID) | INTRAMUSCULAR | Status: DC
  Administered 2021-03-13 – 2021-03-14 (×3): 30 mg via SUBCUTANEOUS
  Filled 2021-03-12 (×3): qty 0.3

## 2021-03-12 MED ORDER — BISACODYL 10 MG RE SUPP: 10.0000 mg | Freq: Every day | RECTAL | Status: DC | PRN

## 2021-03-12 MED ORDER — HYDROMORPHONE HCL 1 MG/ML IJ SOLN: 0.5000 mg | INTRAMUSCULAR | Status: DC | PRN

## 2021-03-12 SURGICAL SUPPLY — 71 items
APL PRP STRL LF DISP 70% ISPRP (MISCELLANEOUS) ×2
BLADE SAW 90X13X1.19 OSCILLAT (BLADE) ×2 IMPLANT
BLADE SAW 90X25X1.19 OSCILLAT (BLADE) ×2 IMPLANT
BNDG ELASTIC 6X5.8 VLCR STR LF (GAUZE/BANDAGES/DRESSINGS) ×2 IMPLANT
CANISTER SUCT 1200ML W/VALVE (MISCELLANEOUS) ×2 IMPLANT
CANISTER WOUND CARE 500ML ATS (WOUND CARE) ×2 IMPLANT
CEMENT HV SMART SET (Cement) ×2 IMPLANT
CHLORAPREP W/TINT 26 (MISCELLANEOUS) ×4 IMPLANT
COOLER POLAR GLACIER W/PUMP (MISCELLANEOUS) ×2 IMPLANT
COVER BACK TABLE REUSABLE LG (DRAPES) ×2 IMPLANT
CUFF TOURN SGL QUICK 24 (TOURNIQUET CUFF)
CUFF TOURN SGL QUICK 34 (TOURNIQUET CUFF)
CUFF TRNQT CYL 24X4X16.5-23 (TOURNIQUET CUFF) IMPLANT
CUFF TRNQT CYL 34X4.125X (TOURNIQUET CUFF) IMPLANT
DRAPE 3/4 80X56 (DRAPES) ×8 IMPLANT
ELECT CAUTERY BLADE 6.4 (BLADE) ×2 IMPLANT
ELECT REM PT RETURN 9FT ADLT (ELECTROSURGICAL) ×2
ELECTRODE REM PT RTRN 9FT ADLT (ELECTROSURGICAL) ×1 IMPLANT
GAUZE 4X4 16PLY ~~LOC~~+RFID DBL (SPONGE) ×2 IMPLANT
GAUZE SPONGE 4X4 12PLY STRL (GAUZE/BANDAGES/DRESSINGS) ×2 IMPLANT
GAUZE XEROFORM 1X8 LF (GAUZE/BANDAGES/DRESSINGS) ×2 IMPLANT
GLOVE SURG ORTHO LTX SZ8 (GLOVE) ×2 IMPLANT
GLOVE SURG SYN 9.0  PF PI (GLOVE) ×2
GLOVE SURG SYN 9.0 PF PI (GLOVE) ×1 IMPLANT
GLOVE SURG UNDER LTX SZ8 (GLOVE) ×2 IMPLANT
GLOVE SURG UNDER POLY LF SZ9 (GLOVE) ×2 IMPLANT
GOWN SRG 2XL LVL 4 RGLN SLV (GOWNS) ×1 IMPLANT
GOWN STRL NON-REIN 2XL LVL4 (GOWNS) ×2
GOWN STRL REUS W/ TWL LRG LVL3 (GOWN DISPOSABLE) ×1 IMPLANT
GOWN STRL REUS W/ TWL XL LVL3 (GOWN DISPOSABLE) ×1 IMPLANT
GOWN STRL REUS W/TWL LRG LVL3 (GOWN DISPOSABLE) ×2
GOWN STRL REUS W/TWL XL LVL3 (GOWN DISPOSABLE) ×2
HOLDER FOLEY CATH W/STRAP (MISCELLANEOUS) ×2 IMPLANT
HOOD PEEL AWAY FLYTE STAYCOOL (MISCELLANEOUS) ×4 IMPLANT
INSERT TIBIAL SZ2 10 THK ~~LOC~~ (Insert) ×1 IMPLANT
IV NS IRRIG 3000ML ARTHROMATIC (IV SOLUTION) ×2 IMPLANT
KIT PREVENA INCISION MGT20CM45 (CANNISTER) ×2 IMPLANT
KIT STIMULAN RAPID CURE 5CC (Orthopedic Implant) ×1 IMPLANT
KIT TURNOVER KIT A (KITS) ×2 IMPLANT
KNEE FEMORAL COMPONENT SZ2 LT (Knees) ×1 IMPLANT
KNIFE SCULPS 14X20 (INSTRUMENTS) ×2 IMPLANT
MANIFOLD NEPTUNE II (INSTRUMENTS) ×2 IMPLANT
NDL SPNL 18GX3.5 QUINCKE PK (NEEDLE) ×1 IMPLANT
NDL SPNL 20GX3.5 QUINCKE YW (NEEDLE) ×1 IMPLANT
NEEDLE SPNL 18GX3.5 QUINCKE PK (NEEDLE) ×2 IMPLANT
NEEDLE SPNL 20GX3.5 QUINCKE YW (NEEDLE) ×2 IMPLANT
NS IRRIG 1000ML POUR BTL (IV SOLUTION) ×2 IMPLANT
PACK TOTAL KNEE (MISCELLANEOUS) ×2 IMPLANT
PAD WRAPON POLAR KNEE (MISCELLANEOUS) ×2 IMPLANT
PULSAVAC PLUS IRRIG FAN TIP (DISPOSABLE) ×2
SCALPEL PROTECTED #10 DISP (BLADE) ×4 IMPLANT
SPONGE T-LAP 18X18 ~~LOC~~+RFID (SPONGE) ×6 IMPLANT
STAPLER SKIN PROX 35W (STAPLE) ×2 IMPLANT
STEM EXTENSION 14 65 (Stem) ×1 IMPLANT
SUCTION FRAZIER HANDLE 10FR (MISCELLANEOUS) ×2
SUCTION TUBE FRAZIER 10FR DISP (MISCELLANEOUS) ×1 IMPLANT
SUT DVC 2 QUILL PDO  T11 36X36 (SUTURE) ×2
SUT DVC 2 QUILL PDO T11 36X36 (SUTURE) ×1 IMPLANT
SUT ETHIBOND 5-0 MS/4 CCS GRN (SUTURE) ×2
SUT TICRON 2-0 30IN 311381 (SUTURE) IMPLANT
SUT V-LOC 90 ABS DVC 3-0 CL (SUTURE) ×2 IMPLANT
SUTURE ETHBND 5-0 MS/4 CCS GRN (SUTURE) IMPLANT
SWAB CULTURE AMIES ANAERIB BLU (MISCELLANEOUS) IMPLANT
SYR 20ML LL LF (SYRINGE) ×2 IMPLANT
SYR 50ML LL SCALE MARK (SYRINGE) ×4 IMPLANT
TIP FAN IRRIG PULSAVAC PLUS (DISPOSABLE) ×1 IMPLANT
TOWEL OR 17X26 4PK STRL BLUE (TOWEL DISPOSABLE) ×2 IMPLANT
TOWER CARTRIDGE SMART MIX (DISPOSABLE) ×2 IMPLANT
TRAY FOLEY MTR SLVR 16FR STAT (SET/KITS/TRAYS/PACK) ×2 IMPLANT
WEDGE FEMORAL REV DIST 4 S2 (Miscellaneous) ×1 IMPLANT
WRAPON POLAR PAD KNEE (MISCELLANEOUS) ×4

## 2021-03-12 NOTE — Op Note (Signed)
03/12/2021  12:14 PM  PATIENT:  Annette [name]  65 y.o. 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 Arthrofibrosis of total knee arthroplasty, initial encounter T84.82XA  PROCEDURE:  Procedure(s): Revision of femoral component of total knee, left - RNFA (Left)  SURGEON: Laurene Footman, MD  ASSISTANTS: Benjaman Lobe RNFA  ANESTHESIA:   spinal  EBL:  Total I/O In: 1300 [I.V.:1300] Out: 325 [Urine:125; Blood:200]  BLOOD ADMINISTERED:none  DRAINS:  Incisional wound VAC    LOCAL MEDICATIONS USED:  MARCAINE    and OTHER morphine and Exparel  SPECIMEN:  No Specimen  DISPOSITION OF SPECIMEN:  N/A  COUNTS:  YES  TOURNIQUET:   Total Tourniquet Time Documented: Thigh (Left) - 65 minutes Total: Thigh (Left) - 65 minutes   IMPLANTS: Medacta size 2 left total stabilized distal femur with 14 x 65 stem 4 mm medial distal augment and 10 mm total stabilized tibial insert with stimulan beads with vancomycin  DICTATION: .Dragon Dictation patient was brought the operating room and after adequate anesthesia was obtained the left leg was prepped and draped in usual sterile fashion with a tourniquet applied the upper thigh.  After patient identification timeout procedure completed midline skin is incision was made followed by elevating flaps there was dense scar tissue directly over the scar followed by medial parapatellar arthrotomy where there is still extensive scar noted.  Scar deep to the capsule was exposed excised around the medial and lateral joint space and posterior to the patellar tendon to get adequate exposure of the joint.  The femoral component was exposed and after tibial component was removed without difficulty the femoral component was removed using a thin flexible osteotome to separate the metal  from the cement and delaminating that so that the femur could be removed with hardly any bone loss.  The femur was then prepared with first cutting the distal femur freehand removing his same a bone on either side followed by drilling and reaming the canal for short stem a size 2 was chosen as previously treated [place] 2+ in the sphere system.  The 2 cutting block was applied and the posterior cuts created.  The trials were placed and a 4 mm augment was needed medially and a 10 mm insert gave acceptable stability with good range of motion with full extension and flexion to 110 easily.  The tourniquet was raised after the arthrotomy because of excessive bleeding.  After the this had been done with the trials all trials were removed and the above local anesthetic and morphine were infiltrated in the periarticular tissues for postop analgesia.  The bony surfaces were thoroughly irrigated and dried and the femoral stemmed component was cemented into position.  With this in place trial insert placed and the knee held in extension till the cement was completely set at which point excess cement was removed.  Following this the final component was inserted with setscrew tightened with a torque screwdriver.  The arthrotomy was repaired using a heavy Ethibond suture #5 Ethibond along the lateral release to help with patellar tracking after the stimulant beads and placed in the superior superior pouch and medial lateral gutters.  Again with absent capsulotomy was repaired range of motion passively bring the leg up in flexion was approximate 105 degrees with 115 with direct pressure.  Full extension was still maintained.  The arthrotomy was  then final closure carried out with heavy Quill suture 3 OV lock subcutaneously followed by skin staples and incisional wound VAC.  Polar Care applied.  PLAN OF CARE: Admit to inpatient   PATIENT DISPOSITION:  PACU - hemodynamically stable.

## 2021-03-12 NOTE — Anesthesia Procedure Notes (Signed)
Spinal  Patient location during procedure: OR Start time: 03/12/2021 9:52 AM End time: 03/12/2021 9:59 AM Reason for block: surgical anesthesia Staffing Performed: other anesthesia staff  Anesthesiologist: Emmie Niemann, MD Resident/CRNA: Natasha Mead, CRNA Other anesthesia staff: Daryel Gerald, RN Preanesthetic Checklist Completed: patient identified, IV checked, site marked, risks and benefits discussed, surgical consent, monitors and equipment checked, pre-op evaluation and timeout performed Spinal Block Patient position: sitting Prep: DuraPrep Patient monitoring: heart rate, cardiac monitor, continuous pulse ox and blood pressure Approach: midline Location: L3-4 Injection technique: single-shot Needle Needle type: Introducer and Pencil-Tip  Needle gauge: 24 G Needle length: 9 cm Assessment Sensory level: T4 Events: CSF return

## 2021-03-12 NOTE — Anesthesia Preprocedure Evaluation (Signed)
Anesthesia Evaluation  Patient identified by MRN, date of birth, ID band Patient awake    Reviewed: Allergy & Precautions, NPO status , Patient's Chart, lab work & pertinent test results  History of Anesthesia Complications (+) PONV and history of anesthetic complications  Airway Mallampati: II  TM Distance: >3 FB Neck ROM: Full    Dental no notable dental hx.    Pulmonary neg sleep apnea, neg COPD, former smoker,    breath sounds clear to auscultation- rhonchi (-) wheezing      Cardiovascular (-) hypertension(-) CAD, (-) Past MI, (-) Cardiac Stents and (-) CABG  Rhythm:Regular Rate:Normal - Systolic murmurs and - Diastolic murmurs    Neuro/Psych  Headaches, neg Seizures PSYCHIATRIC DISORDERS Anxiety Depression    GI/Hepatic negative GI ROS, Neg liver ROS,   Endo/Other  neg diabetesHypothyroidism   Renal/GU negative Renal ROS     Musculoskeletal  (+) Arthritis ,   Abdominal (+) + obese,   Peds  Hematology  (+) anemia ,   Anesthesia Other Findings Past Medical History: No date: Anemia     Comment:  Jan slightly anemic No date: Anxiety No date: Arthritis     Comment:  FINGERS, knees No date: CAD (coronary artery disease)     Comment:  had carotid endarterectomy No date: Complication of anesthesia     Comment:  nausea, headaches, achy legs No date: Family history of adverse reaction to anesthesia     Comment:  SISTERS BOTH GET NAUSEATED 05/2017: Headache     Comment:  migraines - precipitated by extended use of               celebrex/meloxicam 07/11/2020: History of 2019 novel coronavirus disease (COVID-19) No date: Hyperlipidemia 9/10: Hypothyroidism     Comment:  s/p ablation/ GRAVES DISEASE No date: Neuromuscular disorder ([place])     Comment:  NUMBNESS IN HANDS OR LIPS OCCASIONALLY/ carpal tunnel No date: PONV (postoperative nausea and vomiting)     Comment:  AFTER 11-29-20 SURGERY No date: Psoriasis'   Reproductive/Obstetrics                             Lab Results  Component Value Date   WBC 7.2 02/19/2021   HGB 13.0 02/19/2021   HCT 38.0 02/19/2021   MCV 92.0 02/19/2021   PLT 265 02/19/2021    Anesthesia Physical Anesthesia Plan  ASA: 2  Anesthesia Plan: Spinal   Post-op Pain Management:    Induction:   PONV Risk Score and Plan: 3 and Propofol infusion  Airway Management Planned: Natural Airway  Additional Equipment:   Intra-op Plan:   Post-operative Plan:   Informed Consent: I have reviewed the patients History and Physical, chart, labs and discussed the procedure including the risks, benefits and alternatives for the proposed anesthesia with the patient or authorized representative who has indicated his/her understanding and acceptance.     Dental advisory given  Plan Discussed with: CRNA and Anesthesiologist  Anesthesia Plan Comments:         Anesthesia Quick Evaluation

## 2021-03-12 NOTE — Progress Notes (Signed)
PT Cancellation Note  Patient Details Name: Annette [name] MRN: [id number] DOB: [birth year]   Cancelled Treatment:    Reason Eval/Treat Not Completed: Other (comment). Assisted RN in PACU in donning CPM. Left with all needs in reach.   Isella Slatten 03/12/2021, 2:27 PM Greggory Stallion, PT, DPT [phone]

## 2021-03-12 NOTE — Transfer of Care (Signed)
Immediate Anesthesia Transfer of Care Note  Patient: Annette [name]  Procedure(s) Performed: Revision of femoral component of total knee, left - RNFA (Left: Knee)  Patient Location: PACU  Anesthesia Type:Spinal  Level of Consciousness: awake and alert   Airway & Oxygen Therapy: Patient Spontanous Breathing and Patient connected to face mask oxygen  Post-op Assessment: Report given to RN and Post -op Vital signs reviewed and stable  Post vital signs: Reviewed and stable  Last Vitals:  Vitals Value Taken Time  BP 119/52 03/12/21 1215  Temp    Pulse 97 03/12/21 1219  Resp 18 03/12/21 1219  SpO2 98 % 03/12/21 1219  Vitals shown include unvalidated device data.  Last Pain:  Vitals:   03/12/21 0755  TempSrc: Temporal  PainSc: 0-No pain         Complications: No notable events documented.

## 2021-03-12 NOTE — Plan of Care (Signed)

## 2021-03-12 NOTE — Evaluation (Addendum)
Physical Therapy Evaluation Patient Details Name: Annette [name] MRN: [id number] DOB: May 02, 1956 Today's Date: 03/12/2021   History of Present Illness  Annette [name] is a 33 yoF who comes to [facility] on 03/12/21 for  Lt TKA revision. 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 Pt unable to achieve desired improvement with manipulation under anesthesia. PMH of former smoker, hypothyrodism, graves disease.  Clinical Impression  Pt admitted with above diagnosis. Pt currently with functional limitations due to the deficits listed below (see "PT Problem List"). Upon entry, pt in bed, awake and agreeable to participate. The pt is alert, pleasant, interactive, and able to provide info regarding prior level of function, both in tolerance and independence. Pt is removed from CPM. Commenced HEP education with patient. No assist needed for transition to EOB. Pt given cues and education for safe transfers c RW, suddenly feels hot all over after transfer, similar after return to EOB, but improved with return to lying in bed. Pt not left in chair as IV team arrived mid session and reported pt needed to be in bed for IV placement. Pt asking for education on how to operate CPM in room- author advised this would need to be used with staff setup only for safety. Pt has a CPM at home that is a different model. Patient's performance this date reveals decreased ability, independence, and tolerance in performing all basic mobility required for performance of activities of daily living. Pt requires additional DME, close physical assistance, and cues for safe participate in mobility. Pt will benefit from skilled PT intervention to increase independence and safety with basic mobility in preparation for discharge to the venue listed below.        Follow Up Recommendations Home health PT;Supervision for mobility/OOB (Pt reluctant due to past experience, but will need education  with safe CPM setup)    Equipment Recommendations  None recommended by PT    Recommendations for Other Services       Precautions / Restrictions Precautions Precautions: Fall Restrictions Weight Bearing Restrictions: Yes LLE Weight Bearing: Weight bearing as tolerated      Mobility  Bed Mobility Overal bed mobility: Independent                  Transfers Overall transfer level: Needs assistance Equipment used: Rolling walker (2 wheeled) Transfers: Sit to/from [facility] Sit to Stand: Supervision Stand pivot transfers: Supervision       General transfer comment: no buckling, pain well controlled. Feels hot after transfer, consistent with postoperative sympathetic response in presyncope, improved once returned to bed.  Ambulation/Gait Ambulation/Gait assistance:  (deferred due to presyncope)              Stairs            Wheelchair Mobility    Modified Rankin (Stroke Patients Only)       Balance Overall balance assessment: Modified Independent;No apparent balance deficits (not formally assessed)                                           Pertinent Vitals/Pain Pain Assessment: 0-10 Pain Score: 5  Pain Location: Left knee Pain Descriptors / Indicators: Aching;Operative site guarding Pain Intervention(s): Limited activity within patient's tolerance    Home Living Family/patient expects to be discharged to:: Private residence Living Arrangements: Spouse/significant other Available Help  at Discharge: Family;Available 24 hours/day (Husband will help until POD6; Then sisters will be helping out.) Type of Home: House Home Access: Stairs to enter Entrance Stairs-Rails: Right Entrance Stairs-Number of Steps: 4 Home Layout: Two level;Able to live on main level with bedroom/bathroom Home Equipment: Gilford Rile - 2 wheels;Shower seat - built in;Toilet riser Additional Comments: leg strap    Prior Function Level of  Independence: Independent;Needs assistance   Gait / Transfers Assistance Needed: had not returrned to longer to household distances since prior surgery  ADL's / Homemaking Assistance Needed: Has been modI since prior surgery, but limited due to arthrofibrosis.        Hand Dominance   Dominant Hand: Right    Extremity/Trunk Assessment                Communication   Communication: No difficulties  Cognition Arousal/Alertness: Awake/alert Behavior During Therapy: WFL for tasks assessed/performed Overall Cognitive Status: Within Functional Limits for tasks assessed                                        General Comments      Exercises Total Joint Exercises Short Arc Quad: AROM;Left;15 reps Heel Slides: AAROM;Left;15 reps Goniometric ROM: 27-72 degrees this date.   Assessment/Plan    PT Assessment Patient needs continued PT services  PT Problem List Decreased strength;Decreased range of motion;Decreased activity tolerance;Decreased balance;Decreased mobility;Decreased knowledge of use of DME;Decreased safety awareness;Decreased knowledge of precautions       PT Treatment Interventions DME instruction;Balance training;Gait training;Neuromuscular re-education;Stair training;Functional mobility training;Patient/family education;Therapeutic activities;Therapeutic exercise    PT Goals (Current goals can be found in the Care Plan section)  Acute Rehab PT Goals Patient Stated Goal: be able to bend her leg PT Goal Formulation: With patient Time For Goal Achievement: 03/26/21 Potential to Achieve Goals: Fair    Frequency BID   Barriers to discharge        Co-evaluation               AM-PAC PT "6 Clicks" Mobility  Outcome Measure Help needed turning from your back to your side while in a flat bed without using bedrails?: None Help needed moving from lying on your back to sitting on the side of a flat bed without using bedrails?: None Help  needed moving to and from a bed to a chair (including a wheelchair)?: A Little Help needed standing up from a chair using your arms (e.g., wheelchair or bedside chair)?: A Little Help needed to walk in hospital room?: Total Help needed climbing 3-5 steps with a railing? : Total 6 Click Score: 16    End of Session Equipment Utilized During Treatment: Gait belt Activity Tolerance: Patient tolerated treatment well;Treatment limited secondary to medical complications (Comment) (presyncope prodrome) Patient left: in bed;with family/visitor present;with call bell/phone within reach   PT Visit Diagnosis: Difficulty in walking, not elsewhere classified (R26.2);Unsteadiness on feet (R26.81);Muscle weakness (generalized) (M62.81);Other abnormalities of gait and mobility (R26.89)    Time: 9892-1194 PT Time Calculation (min) (ACUTE ONLY): 59 min   Charges:   PT Evaluation $PT Eval Moderate Complexity: 1 Mod PT Treatments $Therapeutic Exercise: 23-37 mins       6:07 PM, 03/12/21 Etta Grandchild, PT, DPT Physical Therapist - [facility]  [phone] ([place])    [place] 03/12/2021, 6:03 PM

## 2021-03-12 NOTE — TOC Progression Note (Signed)
Transition of Care [facility]) - Progression Note    Patient Details  Name: Annette [name] MRN: [id number] Date of Birth: 06/03/1956  Transition of Care [facility]) CM/SW [place], RN Phone Number: 03/12/2021, 3:54 PM  Clinical Narrative:   Patient lives at home with husband.  She and spouse state that she has been through numerous surgeries and husband, family can assist her at home at discharge.  She has a walker, 3n1 and other equipment at home from her prior surgeries.  Patient feels comfortable returning home at discharge.  Patient and spouse refuse Home Health PT, as she had a negative experience, and would rather return home with family to assist.  She states she has an appointment at [facility] on 22 July for follow up.  Patient and spouse deny discharge needs at this time.  TOC contact information given in the event needs arise.         Expected Discharge Plan and Services                                                 Social Determinants of Health (SDOH) Interventions    Readmission Risk Interventions No flowsheet data found.

## 2021-03-12 NOTE — H&P (Signed)
Chief Complaint  Patient presents with   Left Knee - Pain    History of the Present Illness: Annette [name] is a 65 y.o. female here today.   The patient presents for evaluation of right knee stiffness. The patient has had aleft total knee arthroplasty, and severe arthrofibrosis that has failed manipulation and arthroscopic releases. She saw Dr. Skip Estimable, and he recommended open synovectomy with possible revision of the femoral component. He is not familiar with these implants, and I have discussed that with her today. I have discussed the case with him.  The patient states she has been very depressed as she has never been able to flex her knee. She reports when the therapist came to the house asked her son to help flex her knee without success. She was not able to flex even right out of the hospital. She does not think the scar tissue had time to build up in 3 days. She states she was able to get a CPM machine at home, but her right knee is so stiff that it would not let her move. She states she has to hold onto the machine. The patient presents with a female companion who states the patient wakes up at 3 o'clock in the morning with pain.  I have reviewed past medical, surgical, social and family history, and allergies as documented in the EMR.  Past Medical History: Past Medical History:  Diagnosis Date   Anxiety   Anxiety and depression   Arthritis   Elevated BP   History of cataract   Hyperlipidemia   Hyperthyroidism, unspecified  Evaluated by Dr. Burke Keels at [facility] 2009; s/p I131 ablation 9/10.   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   Migraine   Migraines 01/04/2014   Osteoarthritis   Psoriasis   Past Surgical History: Past Surgical History:  Procedure Laterality Date   Arthroscopic partial medial meniscectomy Right 05/12/2014   ARTHROSCOPY KNEE Left 09/14/2015   CAROTID ENDARTERECTOMY Right 06/24/2017   CATARACT EXTRACTION Left 06/22/2018    CESAREAN SECTION  x 2   CLOSED MANIPULATION KNEE (Left) 01/01/2021  Dr Rudene Christians   HYSTERECTOMY 03/2007  fibroids   KNEE ARTHROSCOPY Left 09/14/2015   Left knee arthroscopy for arthrofibrosis, extensive synovectomy 01/15/2021  Dr Rudene Christians   Left total knee arthroplasty 11/29/2020  Dr Rudene Christians   OTHER SURGERY N/A 2010  THYROID ABLATION   TUBAL LIGATION   Past Family History: Family History  Problem Relation Age of Onset   Arthritis Mother   Gout Mother   Osteoporosis (Thinning of bones) Mother   Coronary Artery Disease (Blocked arteries around heart) Mother   Hyperlipidemia (Elevated cholesterol) Mother   Hip fracture Mother   High blood pressure (Hypertension) Mother   Myocardial Infarction (Heart attack) Father   Colon polyps Father   Diabetes type II Sister   Breast cancer Sister   Coronary Artery Disease (Blocked arteries around heart) Brother   Coronary Artery Disease (Blocked arteries around heart) Brother   Medications: Current Outpatient Medications Ordered in Epic  Medication Sig Dispense Refill   acetaminophen (TYLENOL) 325 MG tablet Take 325-650 mg by mouth 2 (two) times daily as needed   ALPRAZolam (XANAX) 0.25 MG tablet TAKE 1 TABLET (0.25 MG TOTAL) BY MOUTH ONCE DAILY AS NEEDED FOR SLEEP 45 tablet 1   aspirin 81 MG EC tablet Take 81 mg by mouth once daily.   atorvastatin (LIPITOR) 40 MG tablet TAKE 1 TABLET BY MOUTH EVERY DAY 90 tablet 1   citalopram (  CELEXA) 20 MG tablet Take 0.5 tablets (10 mg total) by mouth once daily   levothyroxine (SYNTHROID) 50 MCG tablet TAKE 1 TABLET BY MOUTH EVERY DAY. WAIT 30 MINUTES BBEFORE TAKING OTHER MEDICATIONS 90 tablet 1   meloxicam (MOBIC) 15 MG tablet Take 1 tablet (15 mg total) by mouth once daily 30 tablet 0   multivitamin tablet Take 1 tablet by mouth once daily   oxyCODONE (ROXICODONE) 5 MG immediate release tablet Take 1-2 tablets (5-10 mg total) by mouth every 4 (four) hours as needed for Pain 40 tablet 0   traMADoL (ULTRAM) 50  mg tablet Take 1 tablet (50 mg total) by mouth every 6 (six) hours as needed for Pain 50 tablet 1   ULTRAVATE 0.05 % Lotn APPLY TO THE AFFECTED AREA TWICE DAILY 1   No current Epic-ordered facility-administered medications on file.   Allergies: Allergies  Allergen Reactions   Celebrex [Celecoxib] Headache  Aura migraines   Clindamycin Nausea   Doxycycline Headache   Penicillins Rash    Body mass index is 30.86 kg/m.  Review of Systems: A comprehensive 14 point ROS was performed, reviewed, and the pertinent orthopaedic findings are documented in the HPI.  Vitals:  02/15/21 1304  BP: 122/76    General Physical Examination:   General/Constitutional: No apparent distress: well-nourished and well developed. Eyes: Pupils equal, round with synchronous movement. Lungs: Clear to auscultation HEENT: Normal Vascular: No edema, swelling or tenderness, except as noted in detailed exam. Cardiac: Heart rate and rhythm is regular. Integumentary: No impressive skin lesions present, except as noted in detailed exam. Neuro/Psych: Normal mood and affect, oriented to person, place and time.  Musculoskeletal Examination:  On exam, left knee stiffness. Left knee is stable Left knee range of motion is approximately 20 to 50 degrees. Good left hip motion.  Radiographs:  No new imaging studies were obtained today.  Assessment: No diagnosis found.  Plan:  The patient has clinical findings of severe arthrofibrosis of the left knee status post manipulation and arthroscopic releases.  We discussed the patient's treatment options. I recommend left knee revision. I explained the surgery and postoperative course in detail. I explained she would be in the hospital for 2 nights. I would like her to use a CPM right away so we can get her moving immediately while she is numb from her spinal. I explained we will use antibiotic beads to decrease the risk of infection.   We will schedule the patient  for surgery next week.  Surgical Risks:  The nature of the condition and the proposed procedure has been reviewed in detail with the patient. Surgical versus non-surgical options and prognosis for recovery have been reviewed and the inherent risks and benefits of each have been discussed including the risks of infection, bleeding, injury to nerves/blood vessels/tendons, incomplete relief of symptoms, persisting pain and/or stiffness, loss of function, complex regional pain syndrome, failure of the procedure, as appropriate.  Attestation: I, Dawn Royse, am documenting for [employer], MD utilizing [place].    Electronically signed by Lauris Poag, MD at 02/19/2021 9:22 PM EDT Reviewed  H+P. No changes noted.

## 2021-03-13 ENCOUNTER — Encounter: Payer: Self-pay | Admitting: Orthopedic Surgery

## 2021-03-13 LAB — BASIC METABOLIC PANEL
Anion gap: 8 (ref 5–15)
BUN: 11 mg/dL (ref 8–23)
CO2: 26 mmol/L (ref 22–32)
Calcium: 9.2 mg/dL (ref 8.9–10.3)
Chloride: 104 mmol/L (ref 98–111)
Creatinine, Ser: 0.63 mg/dL (ref 0.44–1.00)
GFR, Estimated: >60 mL/min (ref >60)
Glucose, Bld: 147 mg/dL — ABNORMAL HIGH (ref 70–99)
Potassium: 4.8 mmol/L (ref 3.5–5.1)
Sodium: 138 mmol/L (ref 135–145)

## 2021-03-13 LAB — CBC
HCT: 33.3 % — ABNORMAL LOW (ref 36.0–46.0)
Hemoglobin: 11.4 g/dL — ABNORMAL LOW (ref 12.0–15.0)
MCH: 32.2 pg (ref 26.0–34.0)
MCHC: 34.2 g/dL (ref 30.0–36.0)
MCV: 94.1 fL (ref 80.0–100.0)
Platelets: 269 10*3/uL (ref 150–400)
RBC: 3.54 MIL/uL — ABNORMAL LOW (ref 3.87–5.11)
RDW: 13.7 % (ref 11.5–15.5)
WBC: 10.6 10*3/uL — ABNORMAL HIGH (ref 4.0–10.5)
nRBC: 0 % (ref 0.0–0.2)

## 2021-03-13 MED ORDER — TRAMADOL HCL 50 MG PO TABS: 50.0000 mg | ORAL_TABLET | Freq: Four times a day (QID) | ORAL | 0 refills | Status: DC

## 2021-03-13 MED ORDER — ENOXAPARIN SODIUM 40 MG/0.4ML IJ SOSY: 40.0000 mg | PREFILLED_SYRINGE | INTRAMUSCULAR | 0 refills | Status: DC

## 2021-03-13 NOTE — Progress Notes (Signed)
Physical Therapy Treatment Patient Details Name: Annette [name] MRN: [id number] DOB: 02-12-1956 Today's Date: 03/13/2021    History of Present Illness Annette [name] is a 40 yoF who comes to [facility] on 03/12/21 for  Lt TKA revision. 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 Pt unable to achieve desired improvement with manipulation under anesthesia. PMH of former smoker, hypothyrodism, graves disease.    PT Comments    Pt received seated in recliner agreeable to PT services. Pt progressing in amb with ability to amb 250' total with supervision. Remains heavy reliance on RW for support however able to continue LLE heel strike with gait with min VC's for technique. Pt asc/desc 4 stairs x2 with R railing use, step to pattern. Pt and spouse educated on LE sequencing, appropriate guarding, and use of step to pattern. Pt displayed good and safe ability to perform with no LOB. Pt returned to room and able to return to supine in bed with VC's for LE management to assist LLE due to ROM deficits. Pt given HEP hand out and performed exercises with good understanding. Educated on frequency, sets, reps, and volume. Pt verbalized understanding. PT recs remain appropriate.   Follow Up Recommendations  Home health PT;Supervision for mobility/OOB     Equipment Recommendations  None recommended by PT    Recommendations for Other Services       Precautions / Restrictions Precautions Precautions: Fall Restrictions Weight Bearing Restrictions: Yes LLE Weight Bearing: Weight bearing as tolerated    Mobility  Bed Mobility Overal bed mobility: Independent                  Transfers Overall transfer level: Needs assistance Equipment used: Rolling walker (2 wheeled) Transfers: Sit to/from Stand Sit to Stand: Supervision         General transfer comment: No reports of feeling hot during today's session.  Ambulation/Gait Ambulation/Gait  assistance: Supervision Gait Distance (Feet): 250 Feet Assistive device: Rolling walker (2 wheeled) Gait Pattern/deviations: Step-through pattern;Decreased stance time - left;Decreased step length - right     General Gait Details: decreased speed. Cautious with heavy reliance on UE's.   Stairs             Wheelchair Mobility    Modified Rankin (Stroke Patients Only)       Balance Overall balance assessment: Needs assistance Sitting-balance support: No upper extremity supported;Feet supported Sitting balance-Leahy Scale: Good       Standing balance-Leahy Scale: Fair                              Cognition Arousal/Alertness: Awake/alert Behavior During Therapy: WFL for tasks assessed/performed Overall Cognitive Status: Within Functional Limits for tasks assessed                                        Exercises Total Joint Exercises Ankle Circles/Pumps: AROM;Both;10 reps;Seated Quad Sets: AROM;Left;10 reps;Seated Short Arc Quad: AROM;Left;15 reps;Supine Heel Slides: AAROM;Left;10 reps;Supine Hip ABduction/ADduction: AROM;Left;10 reps;Supine Straight Leg Raises: AROM;Left;10 reps;Supine Other Exercises Other Exercises: Pt and spouse educated on asc/desc stairs, appropraite guarding, LE sequencing.    General Comments        Pertinent Vitals/Pain Pain Assessment: 0-10 Faces Pain Scale: No hurt Pain Intervention(s): Limited activity within patient's tolerance;Repositioned    Home Living  Prior Function            PT Goals (current goals can now be found in the care plan section) Acute Rehab PT Goals Patient Stated Goal: be able to bend her leg PT Goal Formulation: With patient Time For Goal Achievement: 03/26/21 Potential to Achieve Goals: Fair Progress towards PT goals: Progressing toward goals    Frequency    BID      PT Plan Current plan remains appropriate    Co-evaluation               AM-PAC PT "6 Clicks" Mobility   Outcome Measure  Help needed turning from your back to your side while in a flat bed without using bedrails?: None Help needed moving from lying on your back to sitting on the side of a flat bed without using bedrails?: None Help needed moving to and from a bed to a chair (including a wheelchair)?: A Little Help needed standing up from a chair using your arms (e.g., wheelchair or bedside chair)?: A Little Help needed to walk in hospital room?: A Little Help needed climbing 3-5 steps with a railing? : A Little 6 Click Score: 20    End of Session Equipment Utilized During Treatment: Gait belt Activity Tolerance: Patient tolerated treatment well;No increased pain Patient left: in chair;with family/visitor present;with call bell/phone within reach;with SCD's reapplied Nurse Communication: Mobility status PT Visit Diagnosis: Difficulty in walking, not elsewhere classified (R26.2);Unsteadiness on feet (R26.81);Muscle weakness (generalized) (M62.81);Other abnormalities of gait and mobility (R26.89)     Time: 5945-8592 PT Time Calculation (min) (ACUTE ONLY): 32 min  Charges:  $Gait Training: 8-22 mins $Therapeutic Exercise: 8-22 mins                    Tobie Perdue M. Fairly IV, PT, DPT Physical Therapist- [facility]  03/13/2021, 4:01 PM

## 2021-03-13 NOTE — Discharge Summary (Addendum)
Physician Discharge Summary  Patient ID: Annette [name] MRN: [id number] DOB/AGE: 1956/02/23 65 y.o.  Admit date: 03/12/2021 Discharge date: 03/14/2021  Admission Diagnoses:  S/P revision of total knee, left [Z96.652]  Surgeries:Procedure(s): Revision of femoral component of total knee, left - RNFA (Left)   SURGEON: Laurene Footman, MD   ASSISTANTS: Benjaman Lobe RNFA   ANESTHESIA:   spinal   EBL:  Total I/O In: 1300 [I.V.:1300] Out: 325 [Urine:125; Blood:200]   BLOOD ADMINISTERED:none   DRAINS:  Incisional wound VAC     LOCAL MEDICATIONS USED:  MARCAINE    and OTHER morphine and Exparel   SPECIMEN:  No Specimen   DISPOSITION OF SPECIMEN:  N/A   COUNTS:  YES   TOURNIQUET:   Total Tourniquet Time Documented: Thigh (Left) - 65 minutes Total: Thigh (Left) - 65 minutes     IMPLANTS: Medacta size 2 left total stabilized distal femur with 14 x 65 stem 4 mm medial distal augment and 10 mm total stabilized tibial insert with stimulan beads with vancomycin  Discharge Diagnoses: Patient Active Problem List   Diagnosis Date Noted   S/P revision of total knee, left 03/12/2021   Status post total knee replacement using cement, left 11/29/2020   Obesity (BMI 30.0-34.9) 01/15/2018   History of colonic polyps    Rectal polyp    Polyp of sigmoid colon    Benign neoplasm of transverse colon    Carotid stenosis, asymptomatic, right 06/24/2017   Bilateral carotid artery disease ([place]) 06/24/2017   Pulsatile tinnitus of right ear 05/15/2017   Carotid artery stenosis 05/15/2017   Special screening for malignant neoplasms, colon    Benign neoplasm of descending colon    Benign neoplasm of ascending colon    Primary osteoarthritis of right knee 08/12/2016   Anxiety and depression 12/20/2014   Hyperlipidemia, unspecified 12/20/2014   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   Hyperlipidemia 12/20/2014   Knee effusion, right 08/15/2014   Complex tear of medial  meniscus as current injury 05/02/2014   Arthritis 01/04/2014   Migraines 01/04/2014   Psoriasis 01/04/2014    Past Medical History:  Diagnosis Date   Anemia    Jan slightly anemic   Anxiety    Arthritis    FINGERS, knees   CAD (coronary artery disease)    had carotid endarterectomy   Complication of anesthesia    nausea, headaches, achy legs   Family history of adverse reaction to anesthesia    SISTERS BOTH GET NAUSEATED   Headache 05/2017   migraines - precipitated by extended use of celebrex/meloxicam   History of 2019 novel coronavirus disease (COVID-19) 07/11/2020   Hyperlipidemia    Hypothyroidism 9/10   s/p ablation/ GRAVES DISEASE   Neuromuscular disorder (HCC)    NUMBNESS IN HANDS OR LIPS OCCASIONALLY/ carpal tunnel   PONV (postoperative nausea and vomiting)    AFTER 11-29-20 SURGERY   Psoriasis      Transfusion:    Consultants (if any):   Discharged Condition: Improved  Hospital Course: Annette [name] is an 65 y.o. female who was admitted 03/12/2021 with a diagnosis of arthrofibrosis of left total knee arthroplasty and went to the operating room on 03/12/2021 and underwent revision of femoral component of left total knee. The patient received perioperative antibiotics for prophylaxis (see below). The patient tolerated the procedure well and was transported to PACU in stable condition. After meeting PACU criteria, the patient was subsequently transferred to the Orthopaedics/Rehabilitation unit.   The patient received DVT  prophylaxis in the form of early mobilization, Lovenox, Foot Pumps, and TED hose. A sacral pad had been placed and heels were elevated off of the bed with rolled towels in order to protect skin integrity. Foley catheter was discontinued on postoperative day #1.    Physical therapy was initiated postoperatively for transfers, gait training, and strengthening. Occupational therapy was initiated for activities of daily living and evaluation for assisted  devices. Rehabilitation goals were reviewed in detail with the patient. The patient made steady progress with physical therapy and physical therapy recommended discharge to Home.   The patient achieved the preliminary goals of this hospitalization and was felt to be medically and orthopaedically appropriate for discharge.  She was given perioperative antibiotics:  Anti-infectives (From admission, onward)    Start     Dose/Rate Route Frequency Ordered Stop   03/12/21 1700  clindamycin (CLEOCIN) IVPB 900 mg        900 mg 100 mL/hr over 30 Minutes Intravenous Every 6 hours 03/12/21 1532 03/12/21 2136   03/12/21 0600  ceFAZolin (ANCEF) IVPB 2g/100 mL premix  Status:  Discontinued        2 g 200 mL/hr over 30 Minutes Intravenous On call to O.R. 03/11/21 2234 03/12/21 1416     .  Recent vital signs:  Vitals:   03/14/21 0445 03/14/21 0728  BP: (!) 146/56 140/63  Pulse: (!) 57 (!) 55  Resp: 16 16  Temp: 97.7 F (36.5 C) 98.3 F (36.8 C)  SpO2: 97% 98%    Recent laboratory studies:  Recent Labs    03/12/21 1614 03/13/21 0430  WBC 14.1* 10.6*  HGB 13.1 11.4*  HCT 39.1 33.3*  PLT 282 269  K  --  4.8  CL  --  104  CO2  --  26  BUN  --  11  CREATININE 0.66 0.63  GLUCOSE  --  147*  CALCIUM  --  9.2    Diagnostic Studies: DG Knee 1-2 Views Left  Result Date: 03/12/2021 CLINICAL DATA:  Left total knee arthroplasty EXAM: LEFT KNEE - 1-2 VIEW COMPARISON:  11/29/2020 FINDINGS: Left knee demonstrates a total knee arthroplasty without evidence of hardware failure complication. No significant joint effusion. No fracture or dislocation. Alignment is anatomic. Antibiotic beads are noted along the anterior distal femoral diametaphysis. Post-surgical changes noted in the surrounding soft tissues. IMPRESSION: Left total knee arthroplasty. Electronically Signed   By: Kathreen Devoid   On: 03/12/2021 13:26    Discharge Medications:   Allergies as of 03/14/2021       Reactions   Penicillins  Swelling, Rash   Celebrex [celecoxib] Nausea Only, Other (See Comments)   Precipitates migraines with extended use    Doxycycline    Headache   Clindamycin/lincomycin Nausea And Vomiting   headaches   Meloxicam    Causes headaches when taken for an extended period         Medication List     STOP taking these medications    aspirin EC 81 MG tablet   meloxicam 15 MG tablet Commonly known as: MOBIC       TAKE these medications    ALPRAZolam 0.25 MG tablet Commonly known as: XANAX Take 0.25 mg by mouth at bedtime.   alum & mag hydroxide-simeth 200-200-20 MG/5ML suspension Commonly known as: MAALOX/MYLANTA Take 15 mLs by mouth at bedtime as needed for indigestion or heartburn.   atorvastatin 40 MG tablet Commonly known as: LIPITOR Take 40 mg by mouth at bedtime.  Azelastine HCl 137 MCG/SPRAY Soln Place 1 spray into both nostrils 2 (two) times daily as needed for rhinitis.   CALCIUM 600 + D PO Take 1 tablet by mouth as needed.   citalopram 20 MG tablet Commonly known as: CELEXA Take 10 mg by mouth every morning. am   clobetasol ointment 0.05 % Commonly known as: TEMOVATE Apply 1 application topically 2 (two) times daily as needed (psoriasis).   docusate sodium 100 MG capsule Commonly known as: COLACE Take 1 capsule (100 mg total) by mouth 2 (two) times daily.   enoxaparin 40 MG/0.4ML injection Commonly known as: LOVENOX Inject 0.4 mLs (40 mg total) into the skin daily for 14 days.   Excedrin Extra Strength 250-250-65 MG tablet Generic drug: aspirin-acetaminophen-caffeine Take 2 tablets by mouth every 6 (six) hours as needed for headache.   fluticasone 50 MCG/ACT nasal spray Commonly known as: FLONASE Place 1 spray into both nostrils daily as needed for allergies or rhinitis.   levothyroxine 50 MCG tablet Commonly known as: SYNTHROID Take 50 mcg by mouth daily before breakfast.   oxyCODONE 5 MG immediate release tablet Commonly known as: Oxy  IR/ROXICODONE Take 1-2 tablets (5-10 mg total) by mouth every 4 (four) hours as needed for moderate pain (pain score 4-6). What changed:  when to take this additional instructions   SYSTANE OP Place 1 drop into both eyes daily as needed (dry eyes).   traMADol 50 MG tablet Commonly known as: ULTRAM Take 1 tablet (50 mg total) by mouth every 6 (six) hours as needed. What changed: reasons to take this   traMADol 50 MG tablet Commonly known as: ULTRAM Take 1 tablet (50 mg total) by mouth every 6 (six) hours. What changed: You were already taking a medication with the same name, and this prescription was added. Make sure you understand how and when to take each.       ASK your doctor about these medications    methocarbamol 500 MG tablet Commonly known as: ROBAXIN Take 1 tablet (500 mg total) by mouth every 6 (six) hours as needed for muscle spasms.   ondansetron 4 MG tablet Commonly known as: ZOFRAN Take 1 tablet (4 mg total) by mouth every 6 (six) hours as needed for nausea.        Disposition: Home with outpatient PT to start 03/15/21     Follow-up Information     Duanne Guess, PA-C. Schedule an appointment as soon as possible for a visit in 2 week(s).   Specialties: Orthopedic Surgery, Emergency Medicine Why: staple removal Contact information: [place] [address] [place], PA-C 03/14/2021, 10:09 AM

## 2021-03-13 NOTE — Progress Notes (Signed)
Physical Therapy Treatment Patient Details Name: MERRIT WAUGH MRN: [id number] DOB: 14-Sep-1955 Today's Date: 03/13/2021    History of Present Illness Annette [name] is a 29 yoF who comes to [facility] on 03/12/21 for  Lt TKA revision. 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 Pt unable to achieve desired improvement with manipulation under anesthesia. PMH of former smoker, hypothyrodism, graves disease.    PT Comments    Pt in bed upright agreeable to PT. Requires assistance for equipment organization. CPM doffed upon entry.  Pt reporting no pain at baseline today prior to movement. Remains mod-I for bed mobility to sit EOB. Pt orthostatics recorded due to previous PT note reporting sympathetic response. Noted decline in BP with standing but asymptomatic and improves after standing marches. Supervision to stand to RW. Amb 60 today with RW with minguard initially progressing to supervision. Pt initiating heel strike on L foot with PT cuing. Heavy reliance on RW for support but denies dizziness or syncopal/hot flash episodes.  Returned to recline with safe hand placement and RW sequencing and performed HEP there.ex in long sitting. AROM appears to be improving as well. Current D/c recs remain appropriate at this time.   BP prior to mobility: 123/51 mm Hg  BP in standing: 87/53 mm Hg  BP in standing post standing marches: 115/60 mm Hg   Follow Up Recommendations  Home health PT;Supervision for mobility/OOB     Equipment Recommendations  None recommended by PT    Recommendations for Other Services       Precautions / Restrictions Precautions Precautions: Fall Restrictions Weight Bearing Restrictions: Yes LLE Weight Bearing: Weight bearing as tolerated    Mobility  Bed Mobility Overal bed mobility: Independent                  Transfers Overall transfer level: Needs assistance Equipment used: Rolling walker (2  wheeled) Transfers: Sit to/from Stand Sit to Stand: Supervision         General transfer comment: No reports of feeling hot during today's session.  Ambulation/Gait Ambulation/Gait assistance: Supervision Gait Distance (Feet): 60 Feet Assistive device: Rolling walker (2 wheeled) Gait Pattern/deviations: Step-through pattern;Decreased stance time - left;Decreased step length - right     General Gait Details: decreased speed. Cautious with heavy reliance on UE's.   Stairs             Wheelchair Mobility    Modified Rankin (Stroke Patients Only)       Balance Overall balance assessment: Needs assistance Sitting-balance support: No upper extremity supported;Feet supported Sitting balance-Leahy Scale: Good       Standing balance-Leahy Scale: Fair               High level balance activites: Backward walking High Level Balance Comments: able to take 3-5 steps backwards with RW to open door to room.            Cognition Arousal/Alertness: Awake/alert Behavior During Therapy: WFL for tasks assessed/performed Overall Cognitive Status: Within Functional Limits for tasks assessed                                        Exercises Total Joint Exercises Ankle Circles/Pumps: AROM;Both;10 reps;Seated Quad Sets: AROM;Left;10 reps;Seated Heel Slides: AAROM;Left;10 reps (in long sitting) Hip ABduction/ADduction: AROM;Left;10 reps;Seated Long Arc Quad: AROM;Strengthening;Left;10 reps;Seated Goniometric ROM: 3-70. PROM knee flex 77  Marching in Standing: AROM;Both;10 reps    General Comments        Pertinent Vitals/Pain Pain Assessment: Faces Faces Pain Scale: Hurts a little bit Pain Location: Left knee Pain Descriptors / Indicators: Aching;Operative site guarding Pain Intervention(s): Limited activity within patient's tolerance;Repositioned    Home Living                      Prior Function            PT Goals (current goals  can now be found in the care plan section) Acute Rehab PT Goals Patient Stated Goal: be able to bend her leg PT Goal Formulation: With patient Time For Goal Achievement: 03/26/21 Potential to Achieve Goals: Fair Progress towards PT goals: Progressing toward goals    Frequency    BID      PT Plan Current plan remains appropriate    Co-evaluation              AM-PAC PT "6 Clicks" Mobility   Outcome Measure  Help needed turning from your back to your side while in a flat bed without using bedrails?: None Help needed moving from lying on your back to sitting on the side of a flat bed without using bedrails?: None Help needed moving to and from a bed to a chair (including a wheelchair)?: A Little Help needed standing up from a chair using your arms (e.g., wheelchair or bedside chair)?: A Little Help needed to walk in hospital room?: A Little Help needed climbing 3-5 steps with a railing? : A Lot 6 Click Score: 19    End of Session Equipment Utilized During Treatment: Gait belt Activity Tolerance: Patient tolerated treatment well;No increased pain Patient left: in chair;with family/visitor present;with call bell/phone within reach;with SCD's reapplied Nurse Communication: Mobility status PT Visit Diagnosis: Difficulty in walking, not elsewhere classified (R26.2);Unsteadiness on feet (R26.81);Muscle weakness (generalized) (M62.81);Other abnormalities of gait and mobility (R26.89)     Time: 6270-3500 PT Time Calculation (min) (ACUTE ONLY): 40 min  Charges:  $Gait Training: 8-22 mins $Therapeutic Exercise: 8-22 mins                    Haruka Kowaleski M. Fairly IV, PT, DPT Physical Therapist- [facility]  03/13/2021, 11:03 AM

## 2021-03-13 NOTE — Anesthesia Postprocedure Evaluation (Signed)
Anesthesia Post Note  Patient: Annette [name]  Procedure(s) Performed: Revision of femoral component of total knee, left - RNFA (Left: Knee)  Patient location during evaluation: Nursing Unit Anesthesia Type: Spinal Level of consciousness: oriented and awake and alert Pain management: pain level controlled Vital Signs Assessment: post-procedure vital signs reviewed and stable Respiratory status: spontaneous breathing and respiratory function stable Cardiovascular status: blood pressure returned to baseline and stable Postop Assessment: no headache, no backache, no apparent nausea or vomiting and patient able to bend at knees Anesthetic complications: no   No notable events documented.   Last Vitals:  Vitals:   03/13/21 0356 03/13/21 0755  BP: 135/73 134/61  Pulse: 80 73  Resp: 16 18  Temp: 36.8 C 37.1 C  SpO2: 96% 97%    Last Pain:  Vitals:   03/13/21 0751  TempSrc:   PainSc: 0-No pain                 Alison Stalling

## 2021-03-13 NOTE — Progress Notes (Signed)
  Subjective: 1 Day Post-Op Procedure(s) (LRB): Revision of femoral component of total knee, left - RNFA (Left) Patient reports pain as well-controlled.   Patient is well, and has had no acute complaints or problems Plan is to go Home after hospital stay. Negative for chest pain and shortness of breath Fever: no Gastrointestinal: negative for nausea and vomiting.  Patient has not had a bowel movement.  Objective: Vital signs in last 24 hours: Temp:  [97 F (36.1 C)-98.7 F (37.1 C)] 98.2 F (36.8 C) (07/20 0356) Pulse Rate:  [60-99] 80 (07/20 0356) Resp:  [13-18] 16 (07/20 0356) BP: (106-153)/(52-85) 135/73 (07/20 0356) SpO2:  [95 %-99 %] 96 % (07/20 0356) Weight:  [73.3 kg] 73.3 kg (07/19 0755)  Intake/Output from previous day:  Intake/Output Summary (Last 24 hours) at 03/13/2021 0730 Last data filed at 03/13/2021 0359 Gross per 24 hour  Intake 1847.05 ml  Output 2450 ml  Net -602.95 ml    Intake/Output this shift: No intake/output data recorded.  Labs: Recent Labs    03/12/21 1614 03/13/21 0430  HGB 13.1 11.4*   Recent Labs    03/12/21 1614 03/13/21 0430  WBC 14.1* 10.6*  RBC 4.22 3.54*  HCT 39.1 33.3*  PLT 282 269   Recent Labs    03/12/21 1614 03/13/21 0430  NA  --  138  K  --  4.8  CL  --  104  CO2  --  26  BUN  --  11  CREATININE 0.66 0.63  GLUCOSE  --  147*  CALCIUM  --  9.2   No results for input(s): LABPT, INR in the last 72 hours.   EXAM General - Patient is Alert, Appropriate, and Oriented Extremity - Neurovascular intact Dorsiflexion/Plantar flexion intact Compartment soft Dressing/Incision -Prevena in place, 250 mL of sanguinous drainage noted  Motor Function - intact, moving foot and toes well on exam.   Cardiovascular- Regular rate and rhythm, no murmurs/rubs/gallops Respiratory- Lungs clear to auscultation bilaterally Gastrointestinal- soft, nontender, and active bowel sounds   Assessment/Plan: 1 Day Post-Op Procedure(s)  (LRB): Revision of femoral component of total knee, left - RNFA (Left) Active Problems:   S/P revision of total knee, left  Estimated body mass index is 31.58 kg/m as calculated from the following:   Height as of this encounter: 5' (1.524 m).   Weight as of this encounter: 73.3 kg. Advance diet Up with therapy      DVT Prophylaxis - Lovenox, Ted hose, and foot pumps Weight-Bearing as tolerated to left leg  Cassell Smiles, PA-C [facility] Orthopaedic Surgery 03/13/2021, 7:30 AM

## 2021-03-14 ENCOUNTER — Encounter: Payer: Self-pay | Admitting: Orthopedic Surgery

## 2021-03-14 MED ORDER — DOCUSATE SODIUM 100 MG PO CAPS: 100.0000 mg | ORAL_CAPSULE | Freq: Two times a day (BID) | ORAL | 0 refills | Status: DC

## 2021-03-14 MED ORDER — SODIUM CHLORIDE 0.9 % IR SOLN
Status: DC | PRN
  Administered 2021-03-12: 3000 mL

## 2021-03-14 NOTE — Discharge Instructions (Addendum)

## 2021-03-14 NOTE — Plan of Care (Signed)
Patient discharged home per MD orders at this time.All discharge instructions,education and medications reviewed with pt at bedside.Pt expressed understanding and will comply wih dc instructions.follow up appointments also communicated to patient.no verbal or any ssx of distress.Pt transported home by significant order in a private car.

## 2021-03-14 NOTE — Progress Notes (Signed)
   Subjective: 2 Days Post-Op Procedure(s) (LRB): Revision of femoral component of total knee, left - RNFA (Left) Patient reports pain as moderate.   Patient is well, and has had no acute complaints or problems Denies any CP, SOB, ABD pain. We will continue therapy today.  Plan is to go Home after hospital stay.  Objective: Vital signs in last 24 hours: Temp:  [97.7 F (36.5 C)-98.8 F (37.1 C)] 98.3 F (36.8 C) (07/21 0728) Pulse Rate:  [55-61] 55 (07/21 0728) Resp:  [16] 16 (07/21 0728) BP: (116-150)/(54-63) 140/63 (07/21 0728) SpO2:  [97 %-98 %] 98 % (07/21 0728)  Intake/Output from previous day: 07/20 0701 - 07/21 0700 In: 0  Out: 600 [Urine:600] Intake/Output this shift: No intake/output data recorded.  Recent Labs    03/12/21 1614 03/13/21 0430  HGB 13.1 11.4*   Recent Labs    03/12/21 1614 03/13/21 0430  WBC 14.1* 10.6*  RBC 4.22 3.54*  HCT 39.1 33.3*  PLT 282 269   Recent Labs    03/12/21 1614 03/13/21 0430  NA  --  138  K  --  4.8  CL  --  104  CO2  --  26  BUN  --  11  CREATININE 0.66 0.63  GLUCOSE  --  147*  CALCIUM  --  9.2   No results for input(s): LABPT, INR in the last 72 hours.  EXAM General - Patient is Alert, Appropriate, and Oriented Extremity - Neurovascular intact Sensation intact distally Intact pulses distally Dorsiflexion/Plantar flexion intact Incision: dressing C/D/I and no drainage No cellulitis present Compartment soft Dressing - dressing C/D/I and no drainage Praveena intact without drainage Motor Function - intact, moving foot and toes well on exam.   Past Medical History:  Diagnosis Date   Anemia    Jan slightly anemic   Anxiety    Arthritis    FINGERS, knees   CAD (coronary artery disease)    had carotid endarterectomy   Complication of anesthesia    nausea, headaches, achy legs   Family history of adverse reaction to anesthesia    SISTERS BOTH GET NAUSEATED   Headache 05/2017   migraines -  precipitated by extended use of celebrex/meloxicam   History of 2019 novel coronavirus disease (COVID-19) 07/11/2020   Hyperlipidemia    Hypothyroidism 9/10   s/p ablation/ GRAVES DISEASE   Neuromuscular disorder (HCC)    NUMBNESS IN HANDS OR LIPS OCCASIONALLY/ carpal tunnel   PONV (postoperative nausea and vomiting)    AFTER 11-29-20 SURGERY   Psoriasis     Assessment/Plan:   2 Days Post-Op Procedure(s) (LRB): Revision of femoral component of total knee, left - RNFA (Left) Active Problems:   S/P revision of total knee, left  Estimated body mass index is 31.58 kg/m as calculated from the following:   Height as of this encounter: 5' (1.524 m).   Weight as of this encounter: 73.3 kg. Advance diet Up with therapy Work on bowel movement Pain well controlled Vital signs are stable Good progress with physical therapy, plan on discharge to home today.  We will start outpatient PT tomorrow.  DVT Prophylaxis - Lovenox, TED hose, and SCDs Weight-Bearing as tolerated to left leg   T. Rachelle Hora, PA-C [place] 03/14/2021, 10:02 AM

## 2021-03-14 NOTE — Progress Notes (Signed)
Physical Therapy Treatment Patient Details Name: Annette [name] MRN: [id number] DOB: Apr 24, 1956 Today's Date: 03/14/2021    History of Present Illness Annette [name] is a 71 yoF who comes to [facility] on [facility]/12/21 for  Lt TKA revision. 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 Pt unable to achieve desired improvement with manipulation under anesthesia. PMH of former smoker, hypothyrodism, graves disease.    PT Comments    Pt in bed upon entry, Lt knee resting in extension stretch, husband at bedside. Pt agreeable to session, has no specific areas of review prior to DC. Pt met her therapy goals for DC yesterday. Today, continued with gait training, as pt still using heavy compensation in gait. Pt struggles with body awareness of left knee in gait, but tactile cues are helpful at times. Pt denies any need to see PT again today prior to DC. Pt reports confidence in HEP performance at DC.     Follow Up Recommendations  Supervision for mobility/OOB;Follow surgeon's recommendation for DC plan and follow-up therapies (refuses HHPT again)     Equipment Recommendations  None recommended by PT    Recommendations for Other Services       Precautions / Restrictions Precautions Precautions: Fall Restrictions LLE Weight Bearing: Weight bearing as tolerated    Mobility  Bed Mobility Overal bed mobility: Modified Independent                  Transfers Overall transfer level: Modified independent Equipment used: Rolling walker (2 wheeled) Transfers: Sit to/from Stand              Ambulation/Gait Ambulation/Gait assistance: Supervision;Min guard Gait Distance (Feet): 225 Feet Assistive device: Rolling walker (2 wheeled)       General Gait Details: defaults to a 3-point step-to gait, leading with operative limb, maintains locked Left knee in swing phase.   Stairs             Wheelchair Mobility    Modified Rankin  (Stroke Patients Only)       Balance                                            Cognition Arousal/Alertness: Awake/alert Behavior During Therapy: WFL for tasks assessed/performed Overall Cognitive Status: Within Functional Limits for tasks assessed                                        Exercises Total Joint Exercises Goniometric ROM: 17-74 degrees Left knee ROM Marching in Standing: AAROM;Left;20 reps;Standing;Limitations Marching in Standing Limitations: poor proprioceptive awareness of knee joint, requires tactile cuing facilitation with achieve desired mechanics of permissive knee flexion during hip flexion. Other Exercises Other Exercises: 17f gait training: cues for exagerated left knee flexion in swing phase. Other Exercises: 564fgait training: cues for equlaization of step length for bialt step-throughout gait, cues for safe RW use Other Exercises: 5074fait training: cues forAMB with continuous RW propulsion.    General Comments        Pertinent Vitals/Pain Pain Assessment: 0-10 Pain Score: 7  Pain Location: Left knee Pain Descriptors / Indicators: Aching;Operative site guarding Pain Intervention(s): Limited activity within patient's tolerance;Monitored during session;Repositioned    Home Living  Prior Function            PT Goals (current goals can now be found in the care plan section) Acute Rehab PT Goals Patient Stated Goal: be able to bend her leg PT Goal Formulation: With patient Time For Goal Achievement: 03/26/21 Potential to Achieve Goals: Fair Progress towards PT goals: Progressing toward goals    Frequency    BID      PT Plan Current plan remains appropriate    Co-evaluation              AM-PAC PT "6 Clicks" Mobility   Outcome Measure  Help needed turning from your back to your side while in a flat bed without using bedrails?: None Help needed moving from lying  on your back to sitting on the side of a flat bed without using bedrails?: None Help needed moving to and from a bed to a chair (including a wheelchair)?: A Little Help needed standing up from a chair using your arms (e.g., wheelchair or bedside chair)?: A Little Help needed to walk in hospital room?: A Little Help needed climbing 3-5 steps with a railing? : A Little 6 Click Score: 20    End of Session Equipment Utilized During Treatment: Gait belt Activity Tolerance: Patient tolerated treatment well;No increased pain Patient left: with family/visitor present;with call bell/phone within reach;in bed Nurse Communication: Mobility status PT Visit Diagnosis: Difficulty in walking, not elsewhere classified (R26.2);Unsteadiness on feet (R26.81);Muscle weakness (generalized) (M62.81);Other abnormalities of gait and mobility (R26.89)     Time: 1047-1110 PT Time Calculation (min) (ACUTE ONLY): 23 min  Charges:  $Gait Training: 8-22 mins $Self Care/Home Management: 8-22                    11:17 AM, 03/14/21 Etta Grandchild, PT, DPT Physical Therapist - [facility]  [phone] ([place])     Ria Redcay C 03/14/2021, 11:14 AM

## 2021-04-24 ENCOUNTER — Other Ambulatory Visit: Payer: Self-pay | Admitting: Orthopedic Surgery

## 2021-04-26 ENCOUNTER — Encounter
Admission: RE | Admit: 2021-04-26 | Discharge: 2021-04-26 | Disposition: A | Discharge status: Home / Self Care | Payer: Medicare Other | Source: Ambulatory Visit | Attending: Orthopedic Surgery | Admitting: Orthopedic Surgery

## 2021-04-26 ENCOUNTER — Other Ambulatory Visit: Payer: Self-pay

## 2021-04-26 NOTE — Patient Instructions (Signed)
Your procedure is scheduled on: 04/30/21 Report to [place]. To find out your arrival time please call [phone] between 1PM - 3PM on 04/26/21.  Remember: Instructions that are not followed completely may result in serious medical risk, up to and including death, or upon the discretion of your surgeon and anesthesiologist your surgery may need to be rescheduled.     _X__ 1. Do not eat food after midnight the night before your procedure.                 No gum chewing or hard candies. You may drink clear liquids up to 2 hours                 before you are scheduled to arrive for your surgery- DO not drink clear                 liquids within 2 hours of the start of your surgery.                 Clear Liquids include:  water, apple juice without pulp, clear carbohydrate                 drink such as Clearfast or Gatorade, Black Coffee or Tea (Do not add                 anything to coffee or tea). Diabetics water only  __X__2.  On the morning of surgery brush your teeth with toothpaste and water, you                 may rinse your mouth with mouthwash if you wish.  Do not swallow any              toothpaste of mouthwash.     _X__ 3.  No Alcohol for 24 hours before or after surgery.   _X__ 4.  Do Not Smoke or use e-cigarettes For 24 Hours Prior to Your Surgery.                 Do not use any chewable tobacco products for at least 6 hours prior to                 surgery.  ____  5.  Bring all medications with you on the day of surgery if instructed.   __X__  6.  Notify your doctor if there is any change in your medical condition      (cold, fever, infections).     Do not wear jewelry, make-up, hairpins, clips or nail polish. Do not wear lotions, powders, or perfumes.  Do not shave 48 hours prior to surgery. Men may shave face and neck. Do not bring valuables to the hospital.    [facility] is not responsible for any belongings or  valuables.  Contacts, dentures/partials or body piercings may not be worn into surgery. Bring a case for your contacts, glasses or hearing aids, a denture cup will be supplied. Leave your suitcase in the car. After surgery it may be brought to your room. For patients admitted to the hospital, discharge time is determined by your treatment team.   Patients discharged the day of surgery will not be allowed to drive home.   Please read over the following fact sheets that you were given:     __X__ Take these medicines the morning of surgery with A SIP OF WATER:  1. citalopram (CELEXA) 20 MG tablet  2. levothyroxine (SYNTHROID, LEVOTHROID) 50 MCG tablet  3.   4.  5.  6.  ____ Fleet Enema (as directed)   __X__ Use CHG Soap/SAGE wipes as directed  ____ Use inhalers on the day of surgery  ____ Stop metformin/Janumet/Farxiga 2 days prior to surgery    ____ Take 1/2 of usual insulin dose the night before surgery. No insulin the morning          of surgery.   ____ Stop Blood Thinners Coumadin/Plavix/Xarelto/Pleta/Pradaxa/Eliquis/Effient/Aspirin  on   Or contact your Surgeon, Cardiologist or Medical Doctor regarding  ability to stop your blood thinners  __X__ Stop Anti-inflammatories 7 days before surgery such as Advil, Ibuprofen, Motrin,  BC or Goodies Powder, Naprosyn, Naproxen, Aleve   __X__ Stop all herbal and vitamin supplements, fish oil or vitamin E until after surgery.    ____ Bring C-Pap to the hospital.

## 2021-04-30 ENCOUNTER — Ambulatory Visit: Payer: Medicare Other | Admitting: Urgent Care

## 2021-04-30 ENCOUNTER — Ambulatory Visit: Payer: Medicare Other | Admitting: Anesthesiology

## 2021-04-30 ENCOUNTER — Other Ambulatory Visit: Payer: Self-pay

## 2021-04-30 ENCOUNTER — Ambulatory Visit
Admission: RE | Admit: 2021-04-30 | Discharge: 2021-04-30 | Disposition: A | Discharge status: Home / Self Care | Payer: Medicare Other | Attending: Orthopedic Surgery | Admitting: Orthopedic Surgery

## 2021-04-30 ENCOUNTER — Encounter: Payer: Self-pay | Admitting: Orthopedic Surgery

## 2021-04-30 ENCOUNTER — Encounter
Admission: RE | Disposition: A | Discharge status: Home / Self Care | Payer: Self-pay | Source: Home / Self Care | Attending: Orthopedic Surgery

## 2021-04-30 ENCOUNTER — Ambulatory Visit: Payer: Medicare Other

## 2021-04-30 DIAGNOSIS — Z885 Allergy status to narcotic agent status: Secondary | ICD-10-CM | POA: Insufficient documentation

## 2021-04-30 DIAGNOSIS — Z791 Long term (current) use of non-steroidal anti-inflammatories (NSAID): Secondary | ICD-10-CM | POA: Insufficient documentation

## 2021-04-30 DIAGNOSIS — M1712 Unilateral primary osteoarthritis, left knee: Secondary | ICD-10-CM | POA: Diagnosis not present

## 2021-04-30 DIAGNOSIS — Z888 Allergy status to other drugs, medicaments and biological substances status: Secondary | ICD-10-CM | POA: Diagnosis not present

## 2021-04-30 DIAGNOSIS — M25662 Stiffness of left knee, not elsewhere classified: Secondary | ICD-10-CM | POA: Diagnosis present

## 2021-04-30 DIAGNOSIS — R52 Pain, unspecified: Secondary | ICD-10-CM

## 2021-04-30 DIAGNOSIS — Z79899 Other long term (current) drug therapy: Secondary | ICD-10-CM | POA: Insufficient documentation

## 2021-04-30 DIAGNOSIS — Z886 Allergy status to analgesic agent status: Secondary | ICD-10-CM | POA: Insufficient documentation

## 2021-04-30 DIAGNOSIS — Z96652 Presence of left artificial knee joint: Secondary | ICD-10-CM | POA: Insufficient documentation

## 2021-04-30 DIAGNOSIS — Z881 Allergy status to other antibiotic agents status: Secondary | ICD-10-CM | POA: Insufficient documentation

## 2021-04-30 DIAGNOSIS — Z79890 Hormone replacement therapy (postmenopausal): Secondary | ICD-10-CM | POA: Diagnosis not present

## 2021-04-30 DIAGNOSIS — Z7982 Long term (current) use of aspirin: Secondary | ICD-10-CM | POA: Diagnosis not present

## 2021-04-30 DIAGNOSIS — Z880 Allergy status to penicillin: Secondary | ICD-10-CM | POA: Diagnosis not present

## 2021-04-30 HISTORY — PX: KNEE CLOSED REDUCTION: SHX995

## 2021-04-30 SURGERY — MANIPULATION, KNEE, CLOSED
Anesthesia: General | Site: Knee | Laterality: Left

## 2021-04-30 MED ORDER — APREPITANT 40 MG PO CAPS
ORAL_CAPSULE | ORAL | Status: AC
  Administered 2021-04-30: 40 mg via ORAL
  Filled 2021-04-30: qty 1

## 2021-04-30 MED ORDER — FENTANYL CITRATE (PF) 100 MCG/2ML IJ SOLN
25.0000 ug | INTRAMUSCULAR | Status: DC | PRN
  Administered 2021-04-30 (×2): 25 ug via INTRAVENOUS

## 2021-04-30 MED ORDER — FAMOTIDINE 20 MG PO TABS: 20.0000 mg | ORAL_TABLET | Freq: Once | ORAL | Status: AC

## 2021-04-30 MED ORDER — ONDANSETRON HCL 4 MG/2ML IJ SOLN: 4.0000 mg | Freq: Four times a day (QID) | INTRAMUSCULAR | Status: DC | PRN

## 2021-04-30 MED ORDER — OXYCODONE HCL 5 MG PO TABS: 10.0000 mg | ORAL_TABLET | ORAL | 0 refills | Status: DC | PRN

## 2021-04-30 MED ORDER — ONDANSETRON HCL 4 MG/2ML IJ SOLN: 4.0000 mg | Freq: Once | INTRAMUSCULAR | Status: DC | PRN

## 2021-04-30 MED ORDER — METOCLOPRAMIDE HCL 5 MG/ML IJ SOLN: 5.0000 mg | Freq: Three times a day (TID) | INTRAMUSCULAR | Status: DC | PRN

## 2021-04-30 MED ORDER — ONDANSETRON HCL 4 MG PO TABS: 4.0000 mg | ORAL_TABLET | Freq: Four times a day (QID) | ORAL | Status: DC | PRN

## 2021-04-30 MED ORDER — ACETAMINOPHEN 325 MG PO TABS: 325.0000 mg | ORAL_TABLET | Freq: Four times a day (QID) | ORAL | Status: DC | PRN

## 2021-04-30 MED ORDER — ORAL CARE MOUTH RINSE: 15.0000 mL | Freq: Once | OROMUCOSAL | Status: AC

## 2021-04-30 MED ORDER — OXYCODONE HCL 5 MG PO TABS: 10.0000 mg | ORAL_TABLET | ORAL | Status: DC | PRN

## 2021-04-30 MED ORDER — ROPIVACAINE HCL 2 MG/ML IJ SOLN
INTRAMUSCULAR | Status: DC | PRN
  Administered 2021-04-30: 15 mL via PERINEURAL

## 2021-04-30 MED ORDER — ROPIVACAINE HCL 5 MG/ML IJ SOLN
INTRAMUSCULAR | Status: AC
  Filled 2021-04-30: qty 30

## 2021-04-30 MED ORDER — METOCLOPRAMIDE HCL 10 MG PO TABS: 5.0000 mg | ORAL_TABLET | Freq: Three times a day (TID) | ORAL | Status: DC | PRN

## 2021-04-30 MED ORDER — SODIUM CHLORIDE 0.9 % IV SOLN: INTRAVENOUS | Status: DC

## 2021-04-30 MED ORDER — LIDOCAINE HCL (PF) 1 % IJ SOLN
INTRAMUSCULAR | Status: DC | PRN
  Administered 2021-04-30: 3 mL via SUBCUTANEOUS

## 2021-04-30 MED ORDER — FAMOTIDINE 20 MG PO TABS
ORAL_TABLET | ORAL | Status: AC
  Administered 2021-04-30: 20 mg via ORAL
  Filled 2021-04-30: qty 1

## 2021-04-30 MED ORDER — FENTANYL CITRATE (PF) 100 MCG/2ML IJ SOLN
INTRAMUSCULAR | Status: AC
  Filled 2021-04-30: qty 2

## 2021-04-30 MED ORDER — LIDOCAINE HCL (PF) 1 % IJ SOLN
INTRAMUSCULAR | Status: AC
  Filled 2021-04-30: qty 5

## 2021-04-30 MED ORDER — CHLORHEXIDINE GLUCONATE 0.12 % MT SOLN
OROMUCOSAL | Status: AC
  Administered 2021-04-30: 15 mL via OROMUCOSAL
  Filled 2021-04-30: qty 15

## 2021-04-30 MED ORDER — LACTATED RINGERS IV SOLN: INTRAVENOUS | Status: DC

## 2021-04-30 MED ORDER — METHOCARBAMOL 1000 MG/10ML IJ SOLN
500.0000 mg | Freq: Four times a day (QID) | INTRAVENOUS | Status: DC | PRN
  Filled 2021-04-30: qty 5

## 2021-04-30 MED ORDER — APREPITANT 40 MG PO CAPS: 40.0000 mg | ORAL_CAPSULE | Freq: Once | ORAL | Status: AC

## 2021-04-30 MED ORDER — PROPOFOL 10 MG/ML IV BOLUS
INTRAVENOUS | Status: DC | PRN
Start: 2021-04-30 — End: 2021-04-30
  Administered 2021-04-30: 160 mg via INTRAVENOUS

## 2021-04-30 MED ORDER — CHLORHEXIDINE GLUCONATE 0.12 % MT SOLN: 15.0000 mL | Freq: Once | OROMUCOSAL | Status: AC

## 2021-04-30 MED ORDER — HYDROMORPHONE HCL 1 MG/ML IJ SOLN: 0.5000 mg | INTRAMUSCULAR | Status: DC | PRN

## 2021-04-30 MED ORDER — METHOCARBAMOL 500 MG PO TABS: 500.0000 mg | ORAL_TABLET | Freq: Four times a day (QID) | ORAL | Status: DC | PRN

## 2021-04-30 MED ORDER — OXYCODONE HCL 5 MG PO TABS: 5.0000 mg | ORAL_TABLET | ORAL | Status: DC | PRN

## 2021-04-30 SURGICAL SUPPLY — 7 items
BNDG ELASTIC 4X5.8 VLCR STR LF (GAUZE/BANDAGES/DRESSINGS) ×1 IMPLANT
KIT TURNOVER KIT A (KITS) ×1 IMPLANT
MANIFOLD NEPTUNE II (INSTRUMENTS) ×1 IMPLANT
PADDING CAST 6X4YD NS (MISCELLANEOUS) ×2
PADDING CAST COTTON 6X4 NS (MISCELLANEOUS) IMPLANT
SPLINT CAST 1 STEP 4X30 (MISCELLANEOUS) ×2 IMPLANT
WATER STERILE IRR 500ML POUR (IV SOLUTION) ×1 IMPLANT

## 2021-04-30 NOTE — Anesthesia Preprocedure Evaluation (Signed)
Anesthesia Evaluation  Patient identified by MRN, date of birth, ID band Patient awake    Reviewed: Allergy & Precautions, H&P , NPO status , Patient's Chart, lab work & pertinent test results, reviewed documented beta blocker date and time   History of Anesthesia Complications (+) PONV, Family history of anesthesia reaction and history of anesthetic complications  Airway Mallampati: III   Neck ROM: full    Dental  (+) Poor Dentition, Teeth Intact   Pulmonary neg pulmonary ROS, former smoker,    Pulmonary exam normal        Cardiovascular Exercise Tolerance: Poor (-) angina+ CAD  Normal cardiovascular exam Rhythm:regular Rate:Normal     Neuro/Psych  Headaches, PSYCHIATRIC DISORDERS Anxiety Depression  Neuromuscular disease    GI/Hepatic negative GI ROS, Neg liver ROS,   Endo/Other  Hypothyroidism   Renal/GU negative Renal ROS  negative genitourinary   Musculoskeletal   Abdominal   Peds  Hematology  (+) Blood dyscrasia, anemia ,   Anesthesia Other Findings Past Medical History: No date: Anemia     Comment:  Jan slightly anemic No date: Anxiety No date: Arthritis     Comment:  FINGERS, knees No date: CAD (coronary artery disease)     Comment:  had carotid endarterectomy No date: Complication of anesthesia     Comment:  nausea, headaches, achy legs No date: Family history of adverse reaction to anesthesia     Comment:  SISTERS BOTH GET NAUSEATED 05/2017: Headache     Comment:  migraines - precipitated by extended use of               celebrex/meloxicam 07/11/2020: History of 2019 novel coronavirus disease (COVID-19) No date: Hyperlipidemia 9/10: Hypothyroidism     Comment:  s/p ablation/ GRAVES DISEASE No date: Neuromuscular disorder ([place])     Comment:  NUMBNESS IN HANDS OR LIPS OCCASIONALLY/ carpal tunnel No date: PONV (postoperative nausea and vomiting)     Comment:  AFTER 11-29-20 SURGERY No date:  Psoriasis Past Surgical History: 8/08: ABDOMINAL HYSTERECTOMY 06/16/2018: CATARACT EXTRACTION W/PHACO; Left     Comment:  Procedure: CATARACT EXTRACTION PHACO AND INTRAOCULAR               LENS PLACEMENT ([place]) LEFT;  Surgeon: Leandrew Koyanagi, MD;  Location: [place];  Service:               Ophthalmology;  Laterality: Left; 07/27/2019: CATARACT EXTRACTION W/PHACO; Right     Comment:  Procedure: CATARACT EXTRACTION PHACO AND INTRAOCULAR               LENS PLACEMENT ([facility]) RIGHT TECNIS ADD 6.17, 00:56.9,               27.1%;  Surgeon: Leandrew Koyanagi, MD;  Location:               [place];  Service: Ophthalmology;                Laterality: Right; No date: CESAREAN SECTION     Comment:  x2 10/20/2016: COLONOSCOPY WITH PROPOFOL; N/A     Comment:  Procedure: COLONOSCOPY WITH PROPOFOL;  Surgeon: Lucilla Lame, MD;  Location: [place];  Service:               Endoscopy;  Laterality: N/A; 01/11/2018: COLONOSCOPY WITH PROPOFOL; N/A  Comment:  Procedure: COLONOSCOPY WITH PROPOFOL;  Surgeon: Lucilla Lame, MD;  Location: [place];  Service:               Endoscopy;  Laterality: N/A; 06/24/2017: ENDARTERECTOMY; Right     Comment:  Procedure: ENDARTERECTOMY CAROTID;  Surgeon: Algernon Huxley, MD;  Location: [facility];  Service: Vascular;                Laterality: Right; 09/14/2015: KNEE ARTHROSCOPY; Left     Comment:  Procedure: ARTHROSCOPY KNEE;  Surgeon: Leanor Kail,               MD;  Location: [place];  Service:               Orthopedics;  Laterality: Left; 01/15/2021: KNEE ARTHROSCOPY; Left     Comment:  Procedure: Left knee arthroscopy for arthrofibrosis,               extensive synovectomy;  Surgeon: Hessie Knows, MD;                Location: [facility];  Service: Orthopedics;  Laterality:               Left; 05/12/14: KNEE ARTHROSCOPY W/ PARTIAL MEDIAL MENISCECTOMY;  Right 01/01/2021: KNEE CLOSED REDUCTION; Left     Comment:  Procedure: CLOSED MANIPULATION KNEE;  Surgeon: Hessie Knows, MD;  Location: [facility];  Service: Orthopedics;               Laterality: Left; 10/20/2016: POLYPECTOMY     Comment:  Procedure: POLYPECTOMY;  Surgeon: Lucilla Lame, MD;                Location: [place];  Service: Endoscopy;; 01/11/2018: POLYPECTOMY     Comment:  Procedure: POLYPECTOMY;  Surgeon: Lucilla Lame, MD;                Location: [place];  Service: Endoscopy;; 2010: THYROID SURGERY     Comment:  ablation 11/29/2020: TOTAL KNEE ARTHROPLASTY; Left     Comment:  Procedure: Left total knee arthroplasty - Rachelle Hora               to Assist;  Surgeon: Hessie Knows, MD;  Location: [facility]               [facility];  Service: Orthopedics;  Laterality: Left; 03/12/2021: TOTAL KNEE REVISION; Left     Comment:  Procedure: Revision of femoral component of total knee,               left - RNFA;  Surgeon: Hessie Knows, MD;  Location: [facility]              [facility];  Service: Orthopedics;  Laterality: Left; BMI    Body Mass Index: 30.66 kg/m     Reproductive/Obstetrics negative OB ROS                             Anesthesia Physical Anesthesia Plan  ASA: 3  Anesthesia Plan: General   Post-op Pain Management:  Regional for Post-op pain   Induction:   PONV Risk Score and Plan:   Airway Management Planned:   Additional  Equipment:   Intra-op Plan:   Post-operative Plan:   Informed Consent: I have reviewed the patients History and Physical, chart, labs and discussed the procedure including the risks, benefits and alternatives for the proposed anesthesia with the patient or authorized representative who has indicated his/her understanding and acceptance.     Dental Advisory Given  Plan Discussed with: CRNA  Anesthesia Plan Comments: (Surgeon has requested an adductor canal block for post op pain.  Procedure d/w pt  and risks and benefits accepted. ja)        Anesthesia Quick Evaluation

## 2021-04-30 NOTE — Progress Notes (Signed)
Splint removed per MD's order. Pt was able to ambulate without difficulties with a walker. No c/o pain nor distress noted. Pt was discharged home. Pt and husband verbalized fully understanding of discharge instructions. Continue to monitor.

## 2021-04-30 NOTE — Transfer of Care (Signed)
Immediate Anesthesia Transfer of Care Note  Patient: Annette [name]  Procedure(s) Performed: CLOSED MANIPULATION KNEE (Left: Knee)  Patient Location: PACU  Anesthesia Type:General  Level of Consciousness: awake, alert  and oriented  Airway & Oxygen Therapy: Patient Spontanous Breathing and Patient connected to nasal cannula oxygen  Post-op Assessment: Report given to RN and Post -op Vital signs reviewed and stable  Post vital signs: Reviewed and stable  Last Vitals:  Vitals Value Taken Time  BP    Temp    Pulse    Resp    SpO2      Last Pain:  Vitals:   04/30/21 1106  TempSrc: Temporal  PainSc: 2          Complications: No notable events documented.

## 2021-04-30 NOTE — Discharge Instructions (Addendum)
Take as much pain medicine as you need to keep her self moving the knee out straight and bending is much as possible. Larkin Ina can see you tomorrow at 8 AM   AMBULATORY SURGERY  DISCHARGE INSTRUCTIONS   The drugs that you were given will stay in your system until tomorrow so for the next 24 hours you should not:  Drive an automobile Make any legal decisions Drink any alcoholic beverage   You may resume regular meals tomorrow.  Today it is better to start with liquids and gradually work up to solid foods.  You may eat anything you prefer, but it is better to start with liquids, then soup and crackers, and gradually work up to solid foods.   Please notify your doctor immediately if you have any unusual bleeding, trouble breathing, redness and pain at the surgery site, drainage, fever, or pain not relieved by medication.    Additional Instructions:  Please contact your physician with any problems or Same Day Surgery at [phone], Monday through Friday 6 am to 4 pm, or [place] at [facility] number at [phone].

## 2021-04-30 NOTE — H&P (Signed)
History of the Present Illness: Annette [name] is a 65 y.o. female here today.   The patient presents for follow-up evaluation status post revision right total knee arthroplasty. She had a great deal of trouble with motion previously and had a complete revision. She comes back today for recheck. She has been going to physical therapy. Her last visit was on 04/03/2021, with flexion to 80 degrees.  The patient states she did not get physical therapy yesterday. She states her muscles are very tight. She has been getting help with her exercises at home. She notes pulled her groin muscle last week in therapy on the bicycle. She is taking oxycodone 2 to 3 times a day for pain. She states she cannot take TRAMADOL due to migraines with aura.  I have reviewed past medical, surgical, social and family history, and allergies as documented in the EMR.  Past Medical History: Past Medical History:  Diagnosis Date   Anxiety   Anxiety and depression   Arthritis   Elevated BP   History of cataract   Hyperlipidemia   Hyperthyroidism, unspecified  Evaluated by Dr. Burke Keels at [facility] 2009; s/p I131 ablation 9/10.   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   Migraine   Migraines 01/04/2014   Osteoarthritis   Psoriasis   Past Surgical History: Past Surgical History:  Procedure Laterality Date   Arthroscopic partial medial meniscectomy Right 05/12/2014   ARTHROSCOPY KNEE Left 09/14/2015   CAROTID ENDARTERECTOMY Right 06/24/2017   CATARACT EXTRACTION Left 06/22/2018   CESAREAN SECTION  x 2   CLOSED MANIPULATION KNEE (Left) 01/01/2021  Dr Rudene Christians   HYSTERECTOMY 03/2007  fibroids   KNEE ARTHROSCOPY Left 09/14/2015   Left knee arthroscopy for arthrofibrosis, extensive synovectomy 01/15/2021  Dr Rudene Christians   Left total knee arthroplasty 11/29/2020  Dr Rudene Christians   OTHER SURGERY N/A 2010  THYROID ABLATION   REVISION TOTAL KNEE ARTHROPLASTY Left 03/12/2021  Fischer Halley-femoral component   TUBAL  LIGATION   Past Family History: Family History  Problem Relation Age of Onset   Arthritis Mother   Gout Mother   Osteoporosis (Thinning of bones) Mother   Coronary Artery Disease (Blocked arteries around heart) Mother   Hyperlipidemia (Elevated cholesterol) Mother   Hip fracture Mother   High blood pressure (Hypertension) Mother   Myocardial Infarction (Heart attack) Father   Colon polyps Father   Diabetes type II Sister   Breast cancer Sister   Coronary Artery Disease (Blocked arteries around heart) Brother   Coronary Artery Disease (Blocked arteries around heart) Brother   Medications: Current Outpatient Medications Ordered in Epic  Medication Sig Dispense Refill   acetaminophen (TYLENOL) 325 MG tablet Take 325-650 mg by mouth 2 (two) times daily as needed   ALPRAZolam (XANAX) 0.25 MG tablet TAKE 1 TABLET (0.25 MG TOTAL) BY MOUTH ONCE DAILY AS NEEDED FOR SLEEP 45 tablet 1   aspirin 81 MG EC tablet Take 81 mg by mouth once daily.   atorvastatin (LIPITOR) 40 MG tablet TAKE 1 TABLET BY MOUTH EVERY DAY 90 tablet 1   azelastine (ASTELIN) 137 mcg nasal spray Place 1 spray into both nostrils 2 (two) times daily as needed for Rhinitis 30 mL 12   citalopram (CELEXA) 20 MG tablet TAKE 1 TABLET BY MOUTH DAILY 90 tablet 1   docusate (COLACE) 100 MG capsule Take 1 capsule by mouth 2 (two) times daily   levothyroxine (SYNTHROID) 50 MCG tablet TAKE 1 TABLET BY MOUTH EVERY DAY. WAIT 30 MINUTES BBEFORE TAKING OTHER MEDICATIONS  90 tablet 1   meloxicam (MOBIC) 15 MG tablet TAKE 1 TABLET BY MOUTH EVERY DAY 30 tablet 0   multivitamin tablet Take 1 tablet by mouth once daily   oxyCODONE (ROXICODONE) 5 MG immediate release tablet Take 1-2 tablets (5-10 mg total) by mouth every 6 (six) hours as needed for Pain 30 tablet 0   ULTRAVATE 0.05 % Lotn APPLY TO THE AFFECTED AREA TWICE DAILY 1   oxyCODONE (ROXICODONE) 5 MG immediate release tablet Take 1-2 tablets (5-10 mg total) by mouth every 4 (four) hours as  needed for Pain (Patient not taking: Reported on 04/10/2021) 40 tablet 0   No current Epic-ordered facility-administered medications on file.   Allergies: Allergies  Allergen Reactions   Celebrex [Celecoxib] Headache  Aura migraines   Tramadol Headache   Clindamycin Nausea   Doxycycline Headache   Penicillins Rash    Body mass index is 30.78 kg/m.  Review of Systems: A comprehensive 14 point ROS was performed, reviewed, and the pertinent orthopaedic findings are documented in the HPI.  Vitals:  04/10/21 0909  BP: 136/72    General Physical Examination:   General/Constitutional: No apparent distress: well-nourished and well developed. Eyes: Pupils equal, round with synchronous movement. Lungs:  Clear to auscultation HEENT:  Normal Vascular: No edema, swelling or tenderness, except as noted in detailed exam. Cardiac: Heart rate and rhythm is regular. Integumentary: No impressive skin lesions present, except as noted in detailed exam. Neuro/Psych: Normal mood and affect, oriented to person, place and time.  Musculoskeletal Examination:  On exam, right knee extension to 5 or 6 degrees. Tight quadriceps.  Radiographs:   AP, lateral, standing, and sunrise x-rays of the left knee were ordered and personally reviewed today. These show cemented stem in the tibia and femur with good position. There is some calcification on the distal femur consistent with her prior scarring. Alignment is anatomic. Patellofemoral joint is normal in appearance.  X-ray Impression: Well-aligned revision stem on stemmed implants, left knee.  Assessment: ICD-10-CM  1. 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 Primary osteoarthritis of left knee M17.12   Plan:  The patient has clinical findings of stable right knee status post revision right total knee arthroplasty.  We discussed her continued treatment plan at length today. I recommend she continue with physical  therapy to work on range of motion and strengthening of the right knee. I demonstrated some exercises for her to do at home daily. She was advised to continue with her continuous passive motion machine as much as possible.   She will return for followup in 6 weeks.  Attestation: I, Dawn Royse, am documenting for [employer], MD utilizing [place].    Electronically signed by Lauris Poag, MD at 04/11/2021 7:41 PM EDT  She subsequent has been through extensive physical therapy and is plateaued with desire to get more motion she is being brought in today for knee manipulation

## 2021-04-30 NOTE — Op Note (Signed)
04/30/2021  12:57 PM  PATIENT:  Annette [name]  65 y.o. female  PRE-OPERATIVE DIAGNOSIS:  Stiffness of left knee  M25.662 Status post total left knee replacement  Z96.652  POST-OPERATIVE DIAGNOSIS:  Stiffness of left knee  M25.662 Status post total left knee replacement   PROCEDURE:  Procedure(s): CLOSED MANIPULATION KNEE (Left)  SURGEON: Laurene Footman, MD  ASSISTANTS: none  ANESTHESIA:   general  EBL:  Total I/O In: 200 [I.V.:200] Out: -   BLOOD ADMINISTERED:none  DRAINS: none   LOCAL MEDICATIONS USED:  none  SPECIMEN:  No Specimen  DISPOSITION OF SPECIMEN:  N/A  COUNTS:  NO NE no count required, close procedure  TOURNIQUET:  * No tourniquets in log *  IMPLANTS: None  DICTATION: .Dragon Dictation patient was brought the operating room and after adequate anesthesia was obtained appropriate patient identification timeout procedure completed.  Start of the case initial motion was approximately 10 to 40 degrees range of motion gentle pressure was applied to the proximal tibia holding this repeatedly and straightening the knee going up in flexion and extension till 90 degrees flexion was obtained and a 5 degrees from full extension.  The knee was then splinted at 90 degrees of flexion to let her maintain this for a few hours after the splint was set she was woken up and sent to recovery room with the leg elevated on pillows.  PLAN OF CARE:  Determination pending pain control postoperatively  PATIENT DISPOSITION:  PACU - hemodynamically stable.

## 2021-04-30 NOTE — Anesthesia Procedure Notes (Signed)
Anesthesia Regional Block: Femoral nerve block   Pre-Anesthetic Checklist: , timeout performed,  Correct Patient, Correct Site, Correct Laterality,  Correct Procedure, Correct Position, site marked,  Risks and benefits discussed,  Surgical consent,  Pre-op evaluation,  At surgeon's request and post-op pain management  Laterality: Lower and Left  Prep: chloraprep       Needles:  Injection technique: Single-shot  Needle Type: Echogenic Stimulator Needle     Needle Length: 9cm  Needle Gauge: 21     Additional Needles:   Procedures:,,,, ultrasound used (permanent image in chart),,    Narrative:  Start time: 04/30/2021 2:30 PM End time: 04/30/2021 2:40 PM Injection made incrementally with aspirations every 5 mL.  Performed by: Personally  Anesthesiologist: Molli Barrows, MD  Additional Notes: Patient consented for risk and benefits of nerve block including but not limited to nerve damage, failed block, bleeding and infection.  Patient voiced understanding.  Functioning IV was confirmed and monitors were applied.  Timeout done prior to procedure and prior to any sedation being given to the patient.  Patient confirmed procedure site prior to any sedation given to the patient.  A 45m 22ga Stimuplex needle was used. Sterile prep,hand hygiene and sterile gloves were used.  Minimal sedation used for procedure.  No paresthesia endorsed by patient during the procedure.  Negative aspiration and negative test dose prior to incremental administration of local anesthetic. The patient tolerated the procedure well with no immediate complications.

## 2021-05-01 ENCOUNTER — Encounter: Payer: Self-pay | Admitting: Orthopedic Surgery

## 2021-05-10 ENCOUNTER — Other Ambulatory Visit: Payer: Self-pay | Admitting: Internal Medicine

## 2021-05-10 DIAGNOSIS — Z1231 Encounter for screening mammogram for malignant neoplasm of breast: Secondary | ICD-10-CM

## 2021-05-10 NOTE — Anesthesia Postprocedure Evaluation (Signed)
Anesthesia Post Note  Patient: EVALEEN CRANSTON  Procedure(s) Performed: CLOSED MANIPULATION KNEE (Left: Knee)  Patient location during evaluation: PACU Anesthesia Type: General Level of consciousness: awake and alert Pain management: pain level controlled Vital Signs Assessment: post-procedure vital signs reviewed and stable Respiratory status: spontaneous breathing, nonlabored ventilation, respiratory function stable and patient connected to nasal cannula oxygen Cardiovascular status: blood pressure returned to baseline and stable Postop Assessment: no apparent nausea or vomiting Anesthetic complications: no   No notable events documented.   Last Vitals:  Vitals:   04/30/21 1541 04/30/21 1643  BP: (!) 153/59 (!) 133/49  Pulse: 61 71  Resp: 18 18  Temp: (!) 36.2 C (!) 36.4 C  SpO2: 99% 98%    Last Pain:  Vitals:   04/30/21 1643  TempSrc: Temporal  PainSc: 0-No pain                 Molli Barrows

## 2021-05-28 ENCOUNTER — Ambulatory Visit (INDEPENDENT_AMBULATORY_CARE_PROVIDER_SITE_OTHER): Payer: Medicare Other | Admitting: Nurse Practitioner

## 2021-05-28 ENCOUNTER — Other Ambulatory Visit: Payer: Self-pay

## 2021-05-28 ENCOUNTER — Ambulatory Visit (INDEPENDENT_AMBULATORY_CARE_PROVIDER_SITE_OTHER): Payer: Medicare Other

## 2021-05-28 VITALS — BP 146/72 | HR 60 | Ht 60.0 in | Wt 158.0 lb

## 2021-05-28 DIAGNOSIS — E785 Hyperlipidemia, unspecified: Secondary | ICD-10-CM | POA: Diagnosis not present

## 2021-05-28 DIAGNOSIS — I6523 Occlusion and stenosis of bilateral carotid arteries: Secondary | ICD-10-CM

## 2021-05-31 ENCOUNTER — Ambulatory Visit (INDEPENDENT_AMBULATORY_CARE_PROVIDER_SITE_OTHER): Payer: Medicare Other | Admitting: Nurse Practitioner

## 2021-05-31 ENCOUNTER — Encounter (INDEPENDENT_AMBULATORY_CARE_PROVIDER_SITE_OTHER): Payer: Medicare Other

## 2021-06-03 ENCOUNTER — Encounter (INDEPENDENT_AMBULATORY_CARE_PROVIDER_SITE_OTHER): Payer: Self-pay | Admitting: Nurse Practitioner

## 2021-06-03 NOTE — Progress Notes (Signed)
Subjective:    Patient ID: Annette [name], female    DOB: 1955/10/15, 65 y.o.   MRN: [id number] Chief Complaint  Patient presents with   Follow-up    1 yr carotid     Annette [name] is a 65 year old female that follows up is seen for follow up evaluation of carotid stenosis. The carotid stenosis followed by ultrasound.  The patient previously had a right endarterectomy with some postop of the right ICA  The patient denies amaurosis fugax. There is no recent history of TIA symptoms or focal motor deficits. There is no prior documented CVA.  The patient is taking enteric-coated aspirin 81 mg daily.  There is no history of migraine headaches. There is no history of seizures.  The patient has a history of coronary artery disease, no recent episodes of angina or shortness of breath. The patient denies PAD or claudication symptoms. There is a history of hyperlipidemia which is being treated with a statin.    Carotid Duplex done today shows 1 to 39% stenosis bilaterally.  No change compared to last study in 06/01/2020.  The right ICA is widely patent but tortuous   Review of Systems  Eyes:  Negative for visual disturbance.  All other systems reviewed and are negative.     Objective:   Physical Exam Vitals reviewed.  HENT:     Head: Normocephalic.  Cardiovascular:     Rate and Rhythm: Normal rate.     Pulses: Normal pulses.  Pulmonary:     Effort: Pulmonary effort is normal.  Skin:    General: Skin is warm and dry.  Neurological:     Mental Status: She is alert and oriented to person, place, and time.  Psychiatric:        Mood and Affect: Mood normal.        Behavior: Behavior normal.        Thought Content: Thought content normal.        Judgment: Judgment normal.    BP (!) 146/72   Pulse 60   Ht 5' (1.524 m)   Wt 158 lb (71.7 kg)   BMI 30.86 kg/m   Past Medical History:  Diagnosis Date   Anemia    Jan slightly anemic   Anxiety    Arthritis    FINGERS, knees    CAD (coronary artery disease)    had carotid endarterectomy   Complication of anesthesia    nausea, headaches, achy legs   Family history of adverse reaction to anesthesia    SISTERS BOTH GET NAUSEATED   Headache 05/2017   migraines - precipitated by extended use of celebrex/meloxicam   History of 2019 novel coronavirus disease (COVID-19) 07/11/2020   Hyperlipidemia    Hypothyroidism 9/10   s/p ablation/ GRAVES DISEASE   Neuromuscular disorder (HCC)    NUMBNESS IN HANDS OR LIPS OCCASIONALLY/ carpal tunnel   PONV (postoperative nausea and vomiting)    AFTER 11-29-20 SURGERY   Psoriasis     Social History   Socioeconomic History   Marital status: Married    Spouse name: Not on file   Number of children: Not on file   Years of education: Not on file   Highest education level: Not on file  Occupational History   Not on file  Tobacco Use   Smoking status: Former    Packs/day: 1.00    Years: 12.00    Pack years: 12.00    Types: Cigarettes    Quit date: 06/25/1985  Years since quitting: 35.9   Smokeless tobacco: Never  Vaping Use   Vaping Use: Never used  Substance and Sexual Activity   Alcohol use: Yes    Comment: OCCAS    Drug use: No   Sexual activity: Not on file  Other Topics Concern   Not on file  Social History Narrative   Not on file   Social Determinants of Health   Financial Resource Strain: Not on file  Food Insecurity: Not on file  Transportation Needs: Not on file  Physical Activity: Not on file  Stress: Not on file  Social Connections: Not on file  Intimate Partner Violence: Not on file    Past Surgical History:  Procedure Laterality Date   ABDOMINAL HYSTERECTOMY  8/08   CATARACT EXTRACTION W/PHACO Left 06/16/2018   Procedure: CATARACT EXTRACTION PHACO AND INTRAOCULAR LENS PLACEMENT ([place]) LEFT;  Surgeon: Leandrew Koyanagi, MD;  Location: [place];  Service: Ophthalmology;  Laterality: Left;   CATARACT EXTRACTION W/PHACO Right  07/27/2019   Procedure: CATARACT EXTRACTION PHACO AND INTRAOCULAR LENS PLACEMENT ([facility]) RIGHT TECNIS ADD 6.17, 00:56.9, 27.1%;  Surgeon: Leandrew Koyanagi, MD;  Location: [place];  Service: Ophthalmology;  Laterality: Right;   CESAREAN SECTION     x2   COLONOSCOPY WITH PROPOFOL N/A 10/20/2016   Procedure: COLONOSCOPY WITH PROPOFOL;  Surgeon: Lucilla Lame, MD;  Location: [place];  Service: Endoscopy;  Laterality: N/A;   COLONOSCOPY WITH PROPOFOL N/A 01/11/2018   Procedure: COLONOSCOPY WITH PROPOFOL;  Surgeon: Lucilla Lame, MD;  Location: [place];  Service: Endoscopy;  Laterality: N/A;   ENDARTERECTOMY Right 06/24/2017   Procedure: ENDARTERECTOMY CAROTID;  Surgeon: Algernon Huxley, MD;  Location: [facility];  Service: Vascular;  Laterality: Right;   KNEE ARTHROSCOPY Left 09/14/2015   Procedure: ARTHROSCOPY KNEE;  Surgeon: Leanor Kail, MD;  Location: [place];  Service: Orthopedics;  Laterality: Left;   KNEE ARTHROSCOPY Left 01/15/2021   Procedure: Left knee arthroscopy for arthrofibrosis, extensive synovectomy;  Surgeon: Hessie Knows, MD;  Location: [facility];  Service: Orthopedics;  Laterality: Left;   KNEE ARTHROSCOPY W/ PARTIAL MEDIAL MENISCECTOMY Right 05/12/14   KNEE CLOSED REDUCTION Left 01/01/2021   Procedure: CLOSED MANIPULATION KNEE;  Surgeon: Hessie Knows, MD;  Location: [facility];  Service: Orthopedics;  Laterality: Left;   KNEE CLOSED REDUCTION Left 04/30/2021   Procedure: CLOSED MANIPULATION KNEE;  Surgeon: Hessie Knows, MD;  Location: [facility];  Service: Orthopedics;  Laterality: Left;   POLYPECTOMY  10/20/2016   Procedure: POLYPECTOMY;  Surgeon: Lucilla Lame, MD;  Location: [place];  Service: Endoscopy;;   POLYPECTOMY  01/11/2018   Procedure: POLYPECTOMY;  Surgeon: Lucilla Lame, MD;  Location: [place];  Service: Endoscopy;;   THYROID SURGERY  2010   ablation   TOTAL KNEE ARTHROPLASTY Left 11/29/2020   Procedure: Left  total knee arthroplasty - Rachelle Hora to Assist;  Surgeon: Hessie Knows, MD;  Location: [facility];  Service: Orthopedics;  Laterality: Left;   TOTAL KNEE REVISION Left 03/12/2021   Procedure: Revision of femoral component of total knee, left - RNFA;  Surgeon: Hessie Knows, MD;  Location: [facility];  Service: Orthopedics;  Laterality: Left;    Family History  Problem Relation Age of Onset   Breast cancer Paternal Grandmother 13   Breast cancer Maternal Grandmother 83   Breast cancer Sister 16    Allergies  Allergen Reactions   Penicillins Swelling and Rash   Celebrex [Celecoxib] Nausea Only and Other (See Comments)  Precipitates migraines with extended use    Doxycycline     Headache   Tramadol Other (See Comments)    headaches   Clindamycin/Lincomycin Nausea And Vomiting    headaches   Meloxicam     Causes headaches when taken for an extended period     CBC Latest Ref Rng & Units 03/13/2021 03/12/2021 02/19/2021  WBC 4.0 - 10.5 K/uL 10.6(H) 14.1(H) 7.2  Hemoglobin 12.0 - 15.0 g/dL 11.4(L) 13.1 13.0  Hematocrit 36.0 - 46.0 % 33.3(L) 39.1 38.0  Platelets 150 - 400 K/uL 269 282 265      CMP     Component Value Date/Time   NA 138 03/13/2021 0430   K 4.8 03/13/2021 0430   CL 104 03/13/2021 0430   CO2 26 03/13/2021 0430   GLUCOSE 147 (H) 03/13/2021 0430   BUN 11 03/13/2021 0430   CREATININE 0.63 03/13/2021 0430   CALCIUM 9.2 03/13/2021 0430   PROT 6.7 02/19/2021 0924   ALBUMIN 3.9 02/19/2021 0924   AST 18 02/19/2021 0924   ALT 20 02/19/2021 0924   ALKPHOS 104 02/19/2021 0924   BILITOT 0.6 02/19/2021 0924   GFRNONAA >60 03/13/2021 0430   GFRAA >60 06/25/2017 0535     No results found.     Assessment & Plan:   1. Bilateral carotid artery stenosis Recommend:  The patient is s/p successful right CEA  Duplex ultrasound preoperatively shows 1-39% contralateral stenosis.  Continue antiplatelet therapy as prescribed Continue management of CAD, HTN and  Hyperlipidemia Healthy heart diet,  encouraged exercise at least 4 times per week  Follow up in 12 months  2. Hyperlipidemia, unspecified hyperlipidemia type Continue statin as ordered and reviewed, no changes at this time    Current Outpatient Medications on File Prior to Visit  Medication Sig Dispense Refill   ALPRAZolam (XANAX) 0.25 MG tablet Take 0.25 mg by mouth at bedtime.     alum & mag hydroxide-simeth (MAALOX/MYLANTA) 200-200-20 MG/5ML suspension Take 15 mLs by mouth at bedtime as needed for indigestion or heartburn.     aspirin EC 81 MG tablet Take 81 mg by mouth daily. Swallow whole.     aspirin-acetaminophen-caffeine (EXCEDRIN EXTRA STRENGTH) 250-250-65 MG tablet Take 2 tablets by mouth every 6 (six) hours as needed for headache.     atorvastatin (LIPITOR) 40 MG tablet Take 20 mg by mouth at bedtime.  11   Calcium Carb-Cholecalciferol (CALCIUM 600 + D PO) Take 1 tablet by mouth daily.     citalopram (CELEXA) 20 MG tablet Take 20 mg by mouth every morning. am     clobetasol ointment (TEMOVATE) 6.96 % Apply 1 application topically 2 (two) times daily as needed (psoriasis).     docusate sodium (COLACE) 100 MG capsule Take 1 capsule (100 mg total) by mouth 2 (two) times daily. 20 capsule 0   fluticasone (FLONASE) 50 MCG/ACT nasal spray Place 1 spray into both nostrils daily as needed for allergies or rhinitis.     levothyroxine (SYNTHROID, LEVOTHROID) 50 MCG tablet Take 50 mcg by mouth daily before breakfast.     methocarbamol (ROBAXIN) 500 MG tablet Take 1 tablet (500 mg total) by mouth every 6 (six) hours as needed for muscle spasms. 30 tablet 0   ondansetron (ZOFRAN) 4 MG tablet Take 1 tablet (4 mg total) by mouth every 6 (six) hours as needed for nausea. 20 tablet 0   oxyCODONE (OXY IR/ROXICODONE) 5 MG immediate release tablet Take 2-3 tablets (10-15 mg total) by mouth every 4 (  four) hours as needed for moderate pain (pain score 4-6). 60 tablet 0   Polyethyl Glycol-Propyl Glycol  (SYSTANE OP) Place 1 drop into both eyes daily as needed (dry eyes).     No current facility-administered medications on file prior to visit.    There are no Patient Instructions on file for this visit. No follow-ups on file.   Kris Hartmann, NP

## 2021-06-14 ENCOUNTER — Other Ambulatory Visit: Payer: Self-pay

## 2021-06-14 ENCOUNTER — Ambulatory Visit
Admission: RE | Admit: 2021-06-14 | Discharge: 2021-06-14 | Disposition: A | Discharge status: Home / Self Care | Payer: Medicare Other | Source: Ambulatory Visit | Attending: Internal Medicine | Admitting: Internal Medicine

## 2021-06-14 DIAGNOSIS — Z1231 Encounter for screening mammogram for malignant neoplasm of breast: Secondary | ICD-10-CM | POA: Diagnosis present

## 2021-06-23 IMAGING — CT CT KNEE*L* W/O CM
1 of 3 series · 7 of 14 positions shown, 9 images · non-contrast
Comparison: None.

CLINICAL DATA: Chronic left knee pain. Preoperative evaluation for
osteoarthritis surgery. My knee protocol.

EXAM:
CT OF THE LEFT KNEE WITHOUT CONTRAST
TECHNIQUE: Multidetector CT imaging of the left knee was performed according to
the standard protocol. Multiplanar CT image reconstructions were
also generated.

[Series 7: axial st · axial · 0.38mm/px · z∈[+575,+793]mm · 7 of 292 slices shown, 9 images]
[im 37/292  soft-tissue]
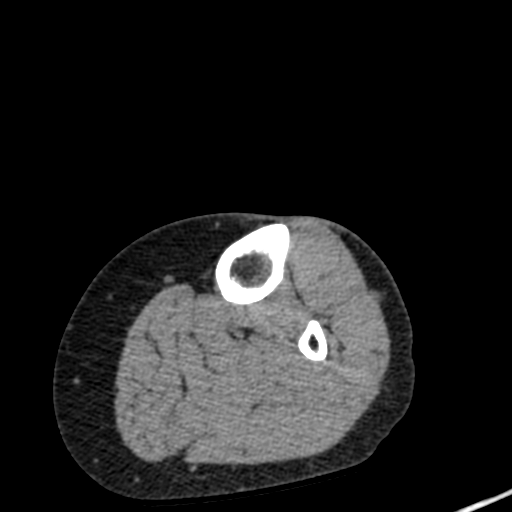
[im 37/292  bone]
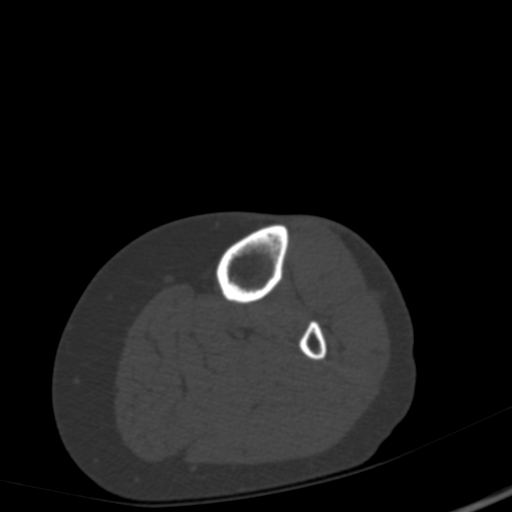
[im 73/292  bone]
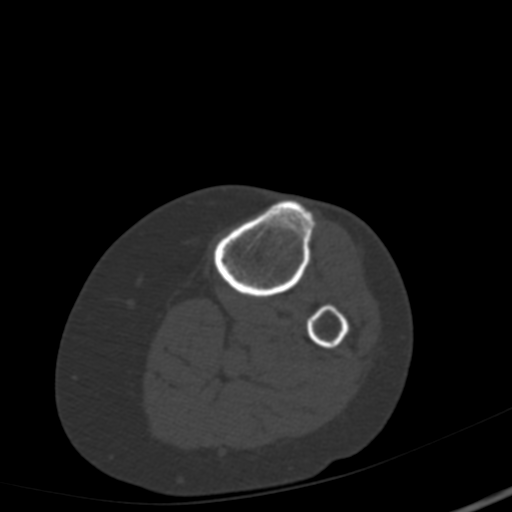
[im 110/292  bone]
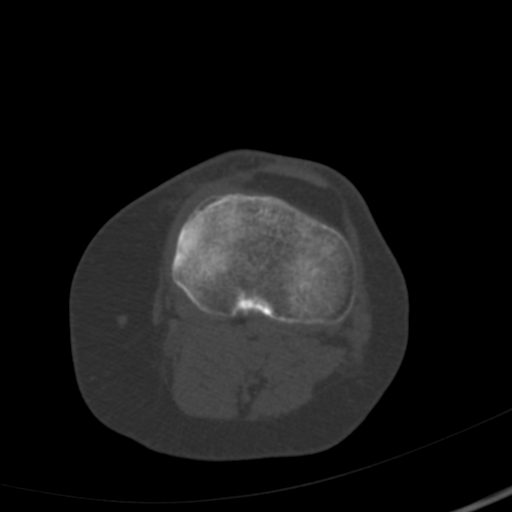
[im 146/292  bone]
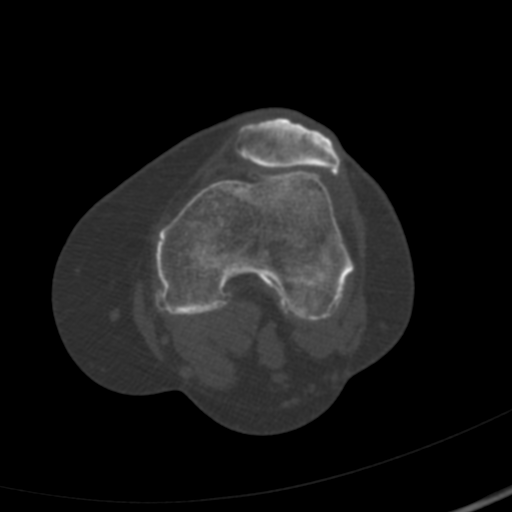
[im 182/292  soft-tissue]
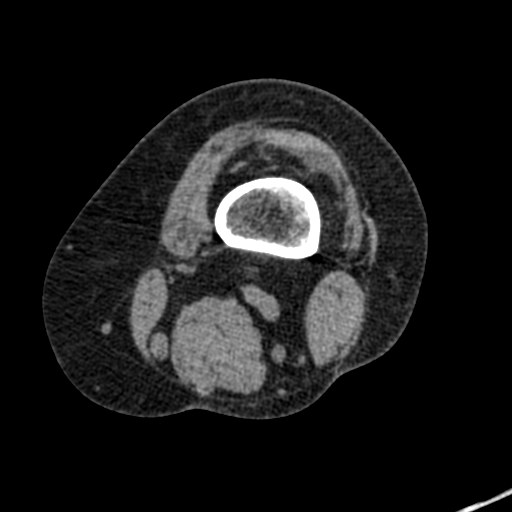
[im 182/292  bone]
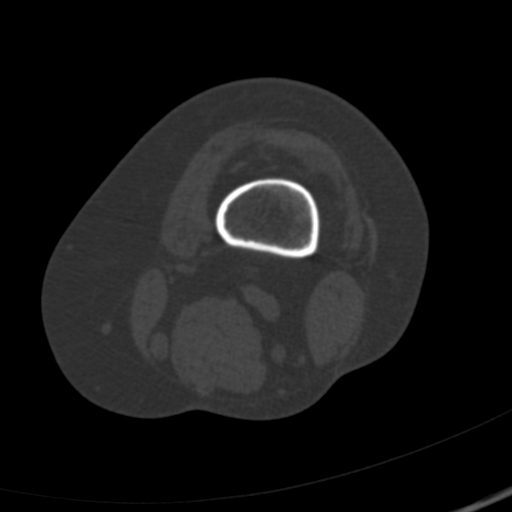
[im 219/292  bone]
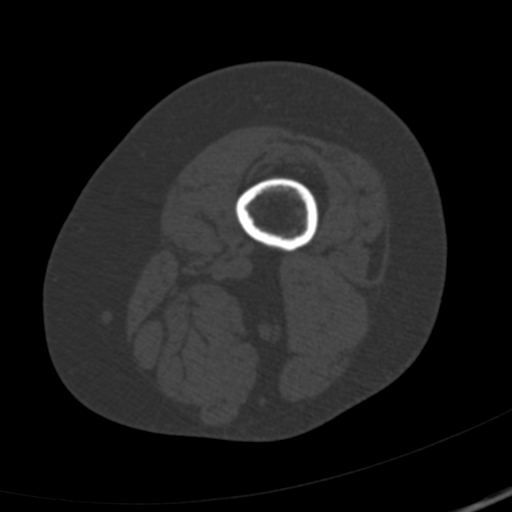
[im 255/292  bone]
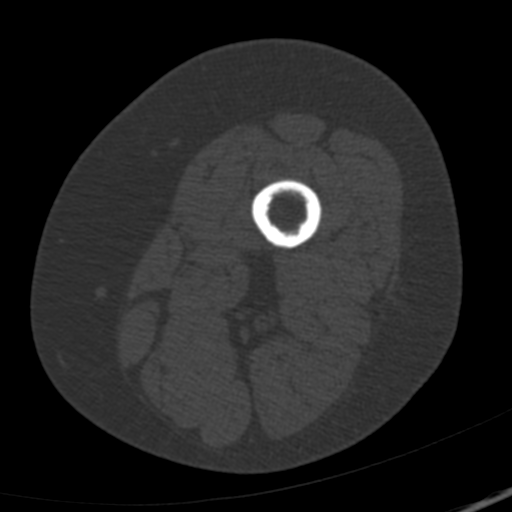

[7 of 14 positions shown; findings below may reference images not displayed]

FINDINGS: Bones/Joint/Cartilage

No evidence of acute fracture or dislocation at the left knee. There
are moderate tricompartmental degenerative changes, most advanced in
the medial compartment. A minimal joint effusion is present without
evidence of loose body.

This protocol includes axial imaging through the left hip and ankle
for surgical planning. These images demonstrate no acute findings at
those joints. There is a prominent left calcaneal peroneal tubercle.

Ligaments

Suboptimally assessed by CT. The anterior cruciate ligament is not
well visualized.

Muscles and Tendons

The extensor mechanism is intact. No focal muscular atrophy.

Soft tissues

Tiny Baker's cyst. The periarticular soft tissues are otherwise
unremarkable.
IMPRESSION: 1. Moderate tricompartmental degenerative changes, most advanced in
the medial compartment.
2. Tiny Baker's cyst.
3. No acute osseous findings.

## 2021-10-15 ENCOUNTER — Other Ambulatory Visit: Payer: Self-pay | Admitting: Podiatry

## 2021-10-16 ENCOUNTER — Other Ambulatory Visit: Payer: Self-pay

## 2021-10-16 ENCOUNTER — Encounter
Admission: RE | Admit: 2021-10-16 | Discharge: 2021-10-16 | Disposition: A | Discharge status: Home / Self Care | Payer: Medicare Other | Source: Ambulatory Visit | Attending: Podiatry | Admitting: Podiatry

## 2021-10-16 HISTORY — DX: Occlusion and stenosis of unspecified carotid artery: I65.29

## 2021-10-16 HISTORY — DX: Gastro-esophageal reflux disease without esophagitis: K21.9

## 2021-10-16 NOTE — Patient Instructions (Signed)
Your procedure is scheduled on:10-25-21 Friday Report to the Registration Desk on the 1st floor of the [facility].Then proceed to the 2nd floor Surgery Desk in the [facility] To find out your arrival time, please call [phone] between 1PM - 3PM on:10-24-21 Thursday  REMEMBER: Instructions that are not followed completely may result in serious medical risk, up to and including death; or upon the discretion of your surgeon and anesthesiologist your surgery may need to be rescheduled.  Do not eat food after midnight the night before surgery.  No gum chewing, lozengers or hard candies.  You may however, drink CLEAR liquids up to 2 hours before you are scheduled to arrive for your surgery. Do not drink anything within 2 hours of your scheduled arrival time.  Clear liquids include: - water  - apple juice without pulp - gatorade (not RED colors) - black coffee or tea (Do NOT add milk or creamers to the coffee or tea) Do NOT drink anything that is not on this list.  In addition, your doctor has ordered for you to drink the provided  Ensure Pre-Surgery Clear Carbohydrate Drink  Drinking this carbohydrate drink up to two hours before surgery helps to reduce insulin resistance and improve patient outcomes. Please complete drinking 2 hours prior to scheduled arrival time.  TAKE THESE MEDICATIONS THE MORNING OF SURGERY WITH A SIP OF WATER: -citalopram (CELEXA)  -levothyroxine (SYNTHROID, LEVOTHROID)   Call Dr Karna Christmas office today (10-16-21) about clarifying when you need to stop your aspirin EC 81 MG tablet  One week prior to surgery:Last dose on 10-17-21 Stop Anti-inflammatories (NSAIDS) such as Advil, Aleve, Ibuprofen, Motrin, Naproxen, Naprosyn and Aspirin based products such as Excedrin, Goodys Powder, BC Powder.You may however, continue to take Tylenol if needed for pain up until the day of surgery.  Stop ANY OVER THE COUNTER supplements/vitamins 7 days prior to surgery (Calcium + Vitamin  D)  No Alcohol for 24 hours before or after surgery.  No Smoking including e-cigarettes for 24 hours prior to surgery.  No chewable tobacco products for at least 6 hours prior to surgery.  No nicotine patches on the day of surgery.  Do not use any "recreational" drugs for at least a week prior to your surgery.  Please be advised that the combination of cocaine and anesthesia may have negative outcomes, up to and including death. If you test positive for cocaine, your surgery will be cancelled.  On the morning of surgery brush your teeth with toothpaste and water, you may rinse your mouth with mouthwash if you wish. Do not swallow any toothpaste or mouthwash.  Use CHG Soap as directed on instruction sheet.  Do not wear jewelry, make-up, hairpins, clips or nail polish.  Do not wear lotions, powders, or perfumes.   Do not shave body from the neck down 48 hours prior to surgery just in case you cut yourself which could leave a site for infection.  Also, freshly shaved skin may become irritated if using the CHG soap.  Contact lenses, hearing aids and dentures may not be worn into surgery.  Do not bring valuables to the hospital. [facility] is not responsible for any missing/lost belongings or valuables.   Notify your doctor if there is any change in your medical condition (cold, fever, infection).  Wear comfortable clothing (specific to your surgery type) to the hospital.  After surgery, you can help prevent lung complications by doing breathing exercises.  Take deep breaths and cough every 1-2 hours.  Your doctor may order a device called an Incentive Spirometer to help you take deep breaths. When coughing or sneezing, hold a pillow firmly against your incision with both hands. This is called splinting. Doing this helps protect your incision. It also decreases belly discomfort.  If you are being admitted to the hospital overnight, leave your suitcase in the car. After surgery it  may be brought to your room.  If you are being discharged the day of surgery, you will not be allowed to drive home. You will need a responsible adult (18 years or older) to drive you home and stay with you that night.   If you are taking public transportation, you will need to have a responsible adult (18 years or older) with you. Please confirm with your physician that it is acceptable to use public transportation.   Please call the [facility] at [phone] if you have any questions about these instructions.  Surgery Visitation Policy:  Patients undergoing a surgery or procedure may have one family member or support person with them as long as that person is not COVID-19 positive or experiencing its symptoms.  That person may remain in the waiting area during the procedure and may rotate out with other people.  Inpatient Visitation:    Visiting hours are 7 a.m. to 8 p.m. Up to two visitors ages 16+ are allowed at one time in a patient room. The visitors may rotate out with other people during the day. Visitors must check out when they leave, or other visitors will not be allowed. One designated support person may remain overnight. The visitor must pass COVID-19 screenings, use hand sanitizer when entering and exiting the patients room and wear a mask at all times, including in the patients room. Patients must also wear a mask when staff or their visitor are in the room. Masking is required regardless of vaccination status.

## 2021-10-21 ENCOUNTER — Other Ambulatory Visit: Payer: Self-pay

## 2021-10-21 ENCOUNTER — Encounter
Admission: RE | Admit: 2021-10-21 | Discharge: 2021-10-21 | Disposition: A | Discharge status: Home / Self Care | Payer: Medicare Other | Source: Ambulatory Visit | Attending: Podiatry | Admitting: Podiatry

## 2021-10-21 DIAGNOSIS — Z01810 Encounter for preprocedural cardiovascular examination: Secondary | ICD-10-CM | POA: Diagnosis present

## 2021-10-21 DIAGNOSIS — I2510 Atherosclerotic heart disease of native coronary artery without angina pectoris: Secondary | ICD-10-CM | POA: Diagnosis not present

## 2021-10-25 ENCOUNTER — Encounter
Admission: RE | Disposition: A | Discharge status: Home / Self Care | Payer: Self-pay | Source: Ambulatory Visit | Attending: Podiatry

## 2021-10-25 ENCOUNTER — Ambulatory Visit
Admission: RE | Admit: 2021-10-25 | Discharge: 2021-10-25 | Disposition: A | Discharge status: Home / Self Care | Payer: Medicare Other | Source: Ambulatory Visit | Attending: Podiatry | Admitting: Podiatry

## 2021-10-25 ENCOUNTER — Other Ambulatory Visit: Payer: Self-pay

## 2021-10-25 ENCOUNTER — Ambulatory Visit: Payer: Medicare Other | Admitting: Anesthesiology

## 2021-10-25 ENCOUNTER — Encounter: Payer: Self-pay | Admitting: Podiatry

## 2021-10-25 ENCOUNTER — Ambulatory Visit: Payer: Medicare Other

## 2021-10-25 DIAGNOSIS — D649 Anemia, unspecified: Secondary | ICD-10-CM | POA: Diagnosis not present

## 2021-10-25 DIAGNOSIS — E669 Obesity, unspecified: Secondary | ICD-10-CM | POA: Insufficient documentation

## 2021-10-25 DIAGNOSIS — Z6831 Body mass index (BMI) 31.0-31.9, adult: Secondary | ICD-10-CM | POA: Diagnosis not present

## 2021-10-25 DIAGNOSIS — I2510 Atherosclerotic heart disease of native coronary artery without angina pectoris: Secondary | ICD-10-CM | POA: Diagnosis not present

## 2021-10-25 DIAGNOSIS — K219 Gastro-esophageal reflux disease without esophagitis: Secondary | ICD-10-CM | POA: Diagnosis not present

## 2021-10-25 DIAGNOSIS — M1990 Unspecified osteoarthritis, unspecified site: Secondary | ICD-10-CM | POA: Diagnosis not present

## 2021-10-25 DIAGNOSIS — E039 Hypothyroidism, unspecified: Secondary | ICD-10-CM | POA: Insufficient documentation

## 2021-10-25 DIAGNOSIS — R112 Nausea with vomiting, unspecified: Secondary | ICD-10-CM | POA: Insufficient documentation

## 2021-10-25 DIAGNOSIS — F32A Depression, unspecified: Secondary | ICD-10-CM | POA: Insufficient documentation

## 2021-10-25 DIAGNOSIS — E785 Hyperlipidemia, unspecified: Secondary | ICD-10-CM | POA: Insufficient documentation

## 2021-10-25 DIAGNOSIS — F419 Anxiety disorder, unspecified: Secondary | ICD-10-CM | POA: Insufficient documentation

## 2021-10-25 DIAGNOSIS — M7661 Achilles tendinitis, right leg: Secondary | ICD-10-CM | POA: Insufficient documentation

## 2021-10-25 DIAGNOSIS — M7731 Calcaneal spur, right foot: Secondary | ICD-10-CM | POA: Diagnosis not present

## 2021-10-25 HISTORY — PX: OSTECTOMY: SHX6439

## 2021-10-25 HISTORY — PX: ACHILLES TENDON SURGERY: SHX542

## 2021-10-25 SURGERY — REPAIR, TENDON, ACHILLES
Anesthesia: General | Site: Ankle | Laterality: Right

## 2021-10-25 MED ORDER — ONDANSETRON HCL 4 MG/2ML IJ SOLN
INTRAMUSCULAR | Status: DC | PRN
  Administered 2021-10-25 (×2): 4 mg via INTRAVENOUS

## 2021-10-25 MED ORDER — BUPIVACAINE HCL (PF) 0.25 % IJ SOLN
INTRAMUSCULAR | Status: AC
  Filled 2021-10-25: qty 30

## 2021-10-25 MED ORDER — APREPITANT 40 MG PO CAPS
ORAL_CAPSULE | ORAL | Status: AC
  Administered 2021-10-25: 40 mg via ORAL
  Filled 2021-10-25: qty 1

## 2021-10-25 MED ORDER — LIDOCAINE HCL (PF) 1 % IJ SOLN
INTRAMUSCULAR | Status: AC
  Filled 2021-10-25: qty 30

## 2021-10-25 MED ORDER — MIDAZOLAM HCL 2 MG/2ML IJ SOLN
INTRAMUSCULAR | Status: AC
Start: 2021-10-25 — End: ?
  Filled 2021-10-25: qty 2

## 2021-10-25 MED ORDER — FENTANYL CITRATE (PF) 100 MCG/2ML IJ SOLN
25.0000 ug | INTRAMUSCULAR | Status: DC | PRN
  Administered 2021-10-25: 25 ug via INTRAVENOUS

## 2021-10-25 MED ORDER — BUPIVACAINE HCL (PF) 0.5 % IJ SOLN
INTRAMUSCULAR | Status: AC
  Filled 2021-10-25: qty 30

## 2021-10-25 MED ORDER — FAMOTIDINE 20 MG PO TABS: 20.0000 mg | ORAL_TABLET | Freq: Once | ORAL | Status: AC

## 2021-10-25 MED ORDER — ORAL CARE MOUTH RINSE: 15.0000 mL | Freq: Once | OROMUCOSAL | Status: AC

## 2021-10-25 MED ORDER — 0.9 % SODIUM CHLORIDE (POUR BTL) OPTIME
TOPICAL | Status: DC | PRN
  Administered 2021-10-25 (×2): 500 mL

## 2021-10-25 MED ORDER — FENTANYL CITRATE (PF) 100 MCG/2ML IJ SOLN
INTRAMUSCULAR | Status: DC | PRN
  Administered 2021-10-25 (×2): 50 ug via INTRAVENOUS

## 2021-10-25 MED ORDER — SUCCINYLCHOLINE CHLORIDE 200 MG/10ML IV SOSY
PREFILLED_SYRINGE | INTRAVENOUS | Status: DC | PRN
  Administered 2021-10-25: 100 mg via INTRAVENOUS

## 2021-10-25 MED ORDER — EPHEDRINE SULFATE (PRESSORS) 50 MG/ML IJ SOLN
INTRAMUSCULAR | Status: DC | PRN
Start: 2021-10-25 — End: 2021-10-25
  Administered 2021-10-25: 5 mg via INTRAVENOUS

## 2021-10-25 MED ORDER — SUGAMMADEX SODIUM 500 MG/5ML IV SOLN
INTRAVENOUS | Status: DC | PRN
  Administered 2021-10-25: 400 mg via INTRAVENOUS

## 2021-10-25 MED ORDER — LACTATED RINGERS IV SOLN: INTRAVENOUS | Status: DC

## 2021-10-25 MED ORDER — BUPIVACAINE-EPINEPHRINE (PF) 0.25% -1:200000 IJ SOLN
INTRAMUSCULAR | Status: AC
  Filled 2021-10-25: qty 30

## 2021-10-25 MED ORDER — ACETAMINOPHEN 10 MG/ML IV SOLN
INTRAVENOUS | Status: DC | PRN
  Administered 2021-10-25: 1000 mg via INTRAVENOUS

## 2021-10-25 MED ORDER — PHENYLEPHRINE HCL-NACL 20-0.9 MG/250ML-% IV SOLN
INTRAVENOUS | Status: DC | PRN
  Administered 2021-10-25: 20 ug/min via INTRAVENOUS

## 2021-10-25 MED ORDER — CHLORHEXIDINE GLUCONATE 0.12 % MT SOLN: 15.0000 mL | Freq: Once | OROMUCOSAL | Status: AC

## 2021-10-25 MED ORDER — PROPOFOL 10 MG/ML IV BOLUS
INTRAVENOUS | Status: DC | PRN
  Administered 2021-10-25: 150 mg via INTRAVENOUS

## 2021-10-25 MED ORDER — VANCOMYCIN HCL IN DEXTROSE 1-5 GM/200ML-% IV SOLN: 1000.0000 mg | INTRAVENOUS | Status: AC

## 2021-10-25 MED ORDER — BUPIVACAINE LIPOSOME 1.3 % IJ SUSP
INTRAMUSCULAR | Status: AC
  Filled 2021-10-25: qty 20

## 2021-10-25 MED ORDER — DEXAMETHASONE SODIUM PHOSPHATE 10 MG/ML IJ SOLN
INTRAMUSCULAR | Status: DC | PRN
Start: 2021-10-25 — End: 2021-10-25
  Administered 2021-10-25: 10 mg via INTRAVENOUS

## 2021-10-25 MED ORDER — PROPOFOL 1000 MG/100ML IV EMUL
INTRAVENOUS | Status: AC
  Filled 2021-10-25: qty 100

## 2021-10-25 MED ORDER — APREPITANT 40 MG PO CAPS: 40.0000 mg | ORAL_CAPSULE | Freq: Once | ORAL | Status: AC

## 2021-10-25 MED ORDER — ROCURONIUM BROMIDE 100 MG/10ML IV SOLN
INTRAVENOUS | Status: DC | PRN
  Administered 2021-10-25: 10 mg via INTRAVENOUS
  Administered 2021-10-25: 50 mg via INTRAVENOUS

## 2021-10-25 MED ORDER — VANCOMYCIN HCL IN DEXTROSE 1-5 GM/200ML-% IV SOLN
INTRAVENOUS | Status: AC
  Administered 2021-10-25: 1000 mg via INTRAVENOUS
  Filled 2021-10-25: qty 200

## 2021-10-25 MED ORDER — FENTANYL CITRATE (PF) 100 MCG/2ML IJ SOLN
INTRAMUSCULAR | Status: AC
  Filled 2021-10-25: qty 2

## 2021-10-25 MED ORDER — LIDOCAINE HCL (CARDIAC) PF 100 MG/5ML IV SOSY
PREFILLED_SYRINGE | INTRAVENOUS | Status: DC | PRN
  Administered 2021-10-25: 100 mg via INTRAVENOUS

## 2021-10-25 MED ORDER — CHLORHEXIDINE GLUCONATE 0.12 % MT SOLN
OROMUCOSAL | Status: AC
  Administered 2021-10-25: 15 mL via OROMUCOSAL
  Filled 2021-10-25: qty 15

## 2021-10-25 MED ORDER — OXYCODONE-ACETAMINOPHEN 5-325 MG PO TABS
1.0000 | ORAL_TABLET | Freq: Four times a day (QID) | ORAL | 0 refills | Status: DC | PRN
Start: 2021-10-25 — End: 2022-03-21

## 2021-10-25 MED ORDER — ONDANSETRON HCL 4 MG/2ML IJ SOLN: 4.0000 mg | Freq: Once | INTRAMUSCULAR | Status: DC | PRN

## 2021-10-25 MED ORDER — BUPIVACAINE-EPINEPHRINE 0.25% -1:200000 IJ SOLN
INTRAMUSCULAR | Status: DC | PRN
  Administered 2021-10-25: 20 mL

## 2021-10-25 MED ORDER — FAMOTIDINE 20 MG PO TABS
ORAL_TABLET | ORAL | Status: AC
  Administered 2021-10-25: 20 mg via ORAL
  Filled 2021-10-25: qty 1

## 2021-10-25 MED ORDER — MIDAZOLAM HCL 2 MG/2ML IJ SOLN
INTRAMUSCULAR | Status: DC | PRN
  Administered 2021-10-25: 2 mg via INTRAVENOUS

## 2021-10-25 MED ORDER — GLYCOPYRROLATE 0.2 MG/ML IJ SOLN
INTRAMUSCULAR | Status: DC | PRN
  Administered 2021-10-25: .2 mg via INTRAVENOUS

## 2021-10-25 MED ORDER — FENTANYL CITRATE (PF) 100 MCG/2ML IJ SOLN
INTRAMUSCULAR | Status: AC
  Administered 2021-10-25: 25 ug via INTRAVENOUS
  Filled 2021-10-25: qty 2

## 2021-10-25 SURGICAL SUPPLY — 64 items
ANCH SUT 4.5 FTPRNT PEEK-OPTM (Anchor) ×1 IMPLANT
ANCHOR 4.5 FOOTPRINT ULTRA (Anchor) ×1 IMPLANT
APL SKNCLS STERI-STRIP NONHPOA (GAUZE/BANDAGES/DRESSINGS) ×1
BENZOIN TINCTURE PRP APPL 2/3 (GAUZE/BANDAGES/DRESSINGS) ×1 IMPLANT
BIT DRILL 4X4.5 FOOTPRINT STR (BIT) IMPLANT
BLADE SURG 15 STRL LF DISP TIS (BLADE) ×2 IMPLANT
BLADE SURG 15 STRL SS (BLADE) ×2
BLADE SURG MINI STRL (BLADE) ×3 IMPLANT
BNDG CMPR STD VLCR NS LF 5.8X4 (GAUZE/BANDAGES/DRESSINGS) ×2
BNDG CONFORM 2 STRL LF (GAUZE/BANDAGES/DRESSINGS) ×3 IMPLANT
BNDG CONFORM 3 STRL LF (GAUZE/BANDAGES/DRESSINGS) ×3 IMPLANT
BNDG ELASTIC 4X5.8 VLCR NS LF (GAUZE/BANDAGES/DRESSINGS) ×6 IMPLANT
BNDG ESMARK 4X12 TAN STRL LF (GAUZE/BANDAGES/DRESSINGS) ×3 IMPLANT
BNDG ESMARK 6X12 TAN STRL LF (GAUZE/BANDAGES/DRESSINGS) ×3 IMPLANT
BNDG GAUZE ELAST 4 BULKY (GAUZE/BANDAGES/DRESSINGS) ×3 IMPLANT
BOOT STEPPER DURA SM (SOFTGOODS) ×1 IMPLANT
DRAPE FLUOR MINI C-ARM 54X84 (DRAPES) ×3 IMPLANT
DRILL 4X4.5 FOOTPRINT STR (BIT) ×2
DURAPREP 26ML APPLICATOR (WOUND CARE) ×3 IMPLANT
ELECT REM PT RETURN 9FT ADLT (ELECTROSURGICAL) ×2
ELECTRODE REM PT RTRN 9FT ADLT (ELECTROSURGICAL) ×2 IMPLANT
GAUZE SPONGE 4X4 12PLY STRL (GAUZE/BANDAGES/DRESSINGS) ×3 IMPLANT
GAUZE XEROFORM 1X8 LF (GAUZE/BANDAGES/DRESSINGS) ×3 IMPLANT
GLOVE SURG ENC MOIS LTX SZ7.5 (GLOVE) ×3 IMPLANT
GLOVE SURG UNDER LTX SZ8 (GLOVE) ×3 IMPLANT
GOWN STRL REUS W/ TWL XL LVL3 (GOWN DISPOSABLE) ×4 IMPLANT
GOWN STRL REUS W/TWL XL LVL3 (GOWN DISPOSABLE) ×4
HANDLE YANKAUER SUCT BULB TIP (MISCELLANEOUS) ×3 IMPLANT
KIT TURNOVER KIT A (KITS) ×3 IMPLANT
LABEL OR SOLS (LABEL) IMPLANT
MANIFOLD NEPTUNE II (INSTRUMENTS) ×3 IMPLANT
NDL FILTER BLUNT 18X1 1/2 (NEEDLE) ×2 IMPLANT
NDL HYPO 25X1 1.5 SAFETY (NEEDLE) ×6 IMPLANT
NDL MAYO CATGUT SZ5 (NEEDLE) ×2
NDL SUT 5 .5 CRC TPR PNT MAYO (NEEDLE) ×2 IMPLANT
NEEDLE FILTER BLUNT 18X 1/2SAF (NEEDLE) ×1
NEEDLE FILTER BLUNT 18X1 1/2 (NEEDLE) ×1 IMPLANT
NEEDLE HYPO 25X1 1.5 SAFETY (NEEDLE) ×6 IMPLANT
NS IRRIG 500ML POUR BTL (IV SOLUTION) ×3 IMPLANT
PACK EXTREMITY ARMC (MISCELLANEOUS) ×3 IMPLANT
RASP SM TEAR CROSS CUT (RASP) ×1 IMPLANT
SPLINT CAST 1 STEP 5X30 WHT (MISCELLANEOUS) ×3 IMPLANT
SPLINT FAST PLASTER 5X30 (CAST SUPPLIES) ×1
SPLINT PLASTER CAST FAST 5X30 (CAST SUPPLIES) ×2 IMPLANT
SPONGE T-LAP 18X18 ~~LOC~~+RFID (SPONGE) ×3 IMPLANT
STOCKINETTE M/LG 89821 (MISCELLANEOUS) ×3 IMPLANT
STRIP CLOSURE SKIN 1/2X4 (GAUZE/BANDAGES/DRESSINGS) ×3 IMPLANT
SUT MNCRL 4-0 (SUTURE) ×2
SUT MNCRL 4-0 27XMFL (SUTURE) ×1
SUT PDS AB 0 CT1 27 (SUTURE) IMPLANT
SUT ULTRABRAID #2 38 (SUTURE) ×3 IMPLANT
SUT VIC AB 0 SH 27 (SUTURE) ×2 IMPLANT
SUT VIC AB 2-0 SH 27 (SUTURE) ×2
SUT VIC AB 2-0 SH 27XBRD (SUTURE) ×4 IMPLANT
SUT VIC AB 3-0 SH 27 (SUTURE) ×2
SUT VIC AB 3-0 SH 27X BRD (SUTURE) ×2 IMPLANT
SUT VIC AB 4-0 FS2 27 (SUTURE) ×2 IMPLANT
SUT VICRYL AB 3-0 FS1 BRD 27IN (SUTURE) ×2 IMPLANT
SUTURE MNCRL 4-0 27XMF (SUTURE) ×2 IMPLANT
SWABSTK COMLB BENZOIN TINCTURE (MISCELLANEOUS) ×3 IMPLANT
SYR 10ML LL (SYRINGE) ×6 IMPLANT
SYR 3ML LL SCALE MARK (SYRINGE) ×3 IMPLANT
WAND TOPAZ MICRO DEBRIDER (MISCELLANEOUS) ×1 IMPLANT
WATER STERILE IRR 500ML POUR (IV SOLUTION) ×3 IMPLANT

## 2021-10-25 NOTE — Anesthesia Preprocedure Evaluation (Signed)
Anesthesia Evaluation  ?Patient identified by MRN, date of birth, ID band ?Patient awake ? ? ? ?Reviewed: ?Allergy & Precautions, NPO status , Patient's Chart, lab work & pertinent test results ? ?History of Anesthesia Complications ?(+) PONV and history of anesthetic complications ? ?Airway ?Mallampati: II ? ?TM Distance: >3 FB ?Neck ROM: Full ? ? ? Dental ? ?(+) Dental Advidsory Given ?  ?Pulmonary ?neg shortness of breath, neg sleep apnea, neg COPD, neg recent URI, former smoker,  ?  ?breath sounds clear to auscultation- rhonchi ?(-) wheezing ? ? ? ? ? Cardiovascular ?(-) hypertension(-) angina(-) CAD, (-) Past MI, (-) Cardiac Stents and (-) CABG  ?Rhythm:Regular Rate:Normal ?- Systolic murmurs and - Diastolic murmurs ? ?  ?Neuro/Psych ? Headaches, neg Seizures PSYCHIATRIC DISORDERS Anxiety Depression   ? GI/Hepatic ?Neg liver ROS, GERD  ,  ?Endo/Other  ?neg diabetesHypothyroidism  ? Renal/GU ?negative Renal ROS  ? ?  ?Musculoskeletal ? ?(+) Arthritis ,  ? Abdominal ?(+) + obese,   ?Peds ? Hematology ? ?(+) Blood dyscrasia, anemia ,   ?Anesthesia Other Findings ?Past Medical History: ?No date: Anemia ?    Comment:  Jan slightly anemic ?No date: Anxiety ?No date: Arthritis ?    Comment:  FINGERS, knees ?No date: CAD (coronary artery disease) ?    Comment:  had carotid endarterectomy ?No date: Complication of anesthesia ?    Comment:  nausea, headaches, achy legs ?No date: Family history of adverse reaction to anesthesia ?    Comment:  SISTERS BOTH GET NAUSEATED ?05/2017: Headache ?    Comment:  migraines - precipitated by extended use of  ?             celebrex/meloxicam ?07/11/2020: History of 2019 novel coronavirus disease (COVID-19) ?No date: Hyperlipidemia ?9/10: Hypothyroidism ?    Comment:  s/p ablation/ GRAVES DISEASE ?No date: Neuromuscular disorder ([place]) ?    Comment:  NUMBNESS IN HANDS OR LIPS OCCASIONALLY/ carpal tunnel ?No date: PONV (postoperative nausea and  vomiting) ?    Comment:  AFTER 11-29-20 SURGERY ?No date: Psoriasis' ? Reproductive/Obstetrics ? ?  ? ? ? ? ? ? ? ? ? ? ? ? ? ?  ?  ? ? ? ? ? ? ? ? ?Lab Results  ?Component Value Date  ? WBC 10.6 (H) 03/13/2021  ? HGB 11.4 (L) 03/13/2021  ? HCT 33.3 (L) 03/13/2021  ? MCV 94.1 03/13/2021  ? PLT 269 03/13/2021  ? ? ?Anesthesia Physical ? ?Anesthesia Plan ? ?ASA: 2 ? ?Anesthesia Plan: General  ? ?Post-op Pain Management:   ? ?Induction: Intravenous ? ?PONV Risk Score and Plan: 3 and Propofol infusion, TIVA, Ondansetron and Dexamethasone ? ?Airway Management Planned: LMA ? ?Additional Equipment:  ? ?Intra-op Plan:  ? ?Post-operative Plan: Extubation in OR ? ?Informed Consent: I have reviewed the patients History and Physical, chart, labs and discussed the procedure including the risks, benefits and alternatives for the proposed anesthesia with the patient or authorized representative who has indicated his/her understanding and acceptance.  ? ? ? ?Dental advisory given ? ?Plan Discussed with: CRNA and Anesthesiologist ? ?Anesthesia Plan Comments:   ? ? ? ? ? ? ?Anesthesia Quick Evaluation ? ?

## 2021-10-25 NOTE — H&P (Signed)
HISTORY AND PHYSICAL INTERVAL NOTE: ? ?10/25/2021 ? ?7:22 AM ? ?Annette [name]  has presented today for surgery, with the diagnosis of M76.61 - Achilles tendonitis of right lower extremity.  The various methods of treatment have been discussed with the patient.  No guarantees were given.  After consideration of risks, benefits and other options for treatment, the patient has consented to surgery.  I have reviewed the patients? chart and labs.   ? ? ?[name] history and physical examination was performed in my office.  The patient was reexamined.  There have been no changes to this history and physical examination. ? ?Annette [name] ? ?

## 2021-10-25 NOTE — Transfer of Care (Signed)
Immediate Anesthesia Transfer of Care Note ? ?Patient: Annette [name] ? ?Procedure(s) Performed: ACHILLES TENDON REPAIR - SECONDARY (Right: Ankle) ?CALCANEAL EXOSTECTOMY (Right: Ankle) ? ?Patient Location: PACU ? ?Anesthesia Type:General ? ?Level of Consciousness: awake, drowsy and patient cooperative ? ?Airway & Oxygen Therapy: Patient Spontanous Breathing and Patient connected to face mask oxygen ? ?Post-op Assessment: Report given to RN and Post -op Vital signs reviewed and stable ? ?Post vital signs: Reviewed and stable ? ?Last Vitals:  ?Vitals Value Taken Time  ?BP 161/77 10/25/21 0910  ?Temp    ?Pulse 101 10/25/21 0910  ?Resp 12 10/25/21 0910  ?SpO2 98 % 10/25/21 0910  ? ? ?Last Pain:  ?Vitals:  ? 10/25/21 0621  ?TempSrc: Temporal  ?PainSc: 0-No pain  ?   ? ?  ? ?Complications: No notable events documented. ?

## 2021-10-25 NOTE — Anesthesia Procedure Notes (Signed)
Procedure Name: Intubation ?Date/Time: 10/25/2021 7:36 AM ?Performed by: Kelton Pillar, CRNA ?Pre-anesthesia Checklist: Patient identified, Emergency Drugs available, Suction available and Patient being monitored ?Patient Re-evaluated:Patient Re-evaluated prior to induction ?Oxygen Delivery Method: Circle system utilized ?Preoxygenation: Pre-oxygenation with 100% oxygen ?Induction Type: IV induction ?Ventilation: Mask ventilation without difficulty ?Laryngoscope Size: McGraph and 3 ?Grade View: Grade I ?Tube type: Oral ?Tube size: 6.5 mm ?Number of attempts: 1 ?Airway Equipment and Method: Stylet and Oral airway ?Placement Confirmation: ETT inserted through vocal cords under direct vision, positive ETCO2, breath sounds checked- equal and bilateral and CO2 detector ?Secured at: 21 cm ?Tube secured with: Tape ?Dental Injury: Teeth and Oropharynx as per pre-operative assessment  ? ? ? ? ?

## 2021-10-25 NOTE — Discharge Instructions (Addendum)
[facility]  POST OPERATIVE INSTRUCTIONS FOR DR. FOWLER AND [facility] PODIATRY [facility]PART[facility]   Take your medication as prescribed.  Pain medication should be taken only as needed.  Keep the dressing clean, dry and intact.  Keep your foot elevated above the heart level for the first 48 hours.  We have instructed you to be non-weight bearing.  Always wear your post-op shoe when walking.  Always use your crutches if you are to be non-weight bearing.  Do not take a shower. Baths are permissible as long as the foot is kept out of the water.   Every hour you are awake:  Bend your knee 15 times.  Call [facility] ([phone]) if any of the following problems occur: You develop a temperature or fever. The bandage becomes saturated with blood. Medication does not stop your pain. Injury of the foot occurs. Any symptoms of infection including redness, odor, or red streaks running from wound.  AMBULATORY SURGERY  DISCHARGE INSTRUCTIONS   The drugs that you were given will stay in your system until tomorrow so for the next 24 hours you should not:  Drive an automobile Make any legal decisions Drink any alcoholic beverage   You may resume regular meals tomorrow.  Today it is better to start with liquids and gradually work up to solid foods.  You may eat anything you prefer, but it is better to start with liquids, then soup and crackers, and gradually work up to solid foods.   Please notify your doctor immediately if you have any unusual bleeding, trouble breathing, redness and pain at the surgery site, drainage, fever, or pain not relieved by medication.    Additional Instructions:  Please contact your physician with any problems or Same Day Surgery at [phone], Monday through Friday 6 am to 4 pm, or [place] [facility] [place] [facility] number at [phone]. 

## 2021-10-25 NOTE — Op Note (Addendum)
Operative note ? ? Surgeon:Abbygale Lapid Vickki Muff ? ?  Assistant: None ? ?  Preop diagnosis: 1.  Achilles insertional tendinitis 2.  Posterior calcaneal exostosis all right foot ? ?  Postop diagnosis: Same ? ?  Procedure: 1.  Secondary Achilles debridement and repair posterior Achilles insertion 2.  Posterior calcaneal exostectomy all right foot ? ?  EBL: 10 mL ? ?  Anesthesia:local and general.  Local consisted of a total of 10 cc of 0.25% bupivacaine with epinephrine and 10 cc of Exparel long-acting anesthetic ? ?  Hemostasis: Epinephrine infiltrated along the incision site with the local anesthetic ? ?  Specimen: None ? ?  Complications: None ? ?  Operative indications:Annette [name] is an 66 y.o. that presents today for surgical intervention.  The risks/benefits/alternatives/complications have been discussed and consent has been given. ? ?  Procedure:  ?Patient was brought into the OR and placed on the operating table in the prone position. After anesthesia was obtained theright lower extremity was prepped and draped in usual sterile fashion. ? ?Attention was directed to the posterior aspect of the right heel at the Achilles insertional site where longitudinal incision was performed.  Sharp and blunt dissection carried down to the peritenon.  Peritenon was then incised and reflected medial and lateral at this time a longitudinal tendon splitting incision was made from the posterior superior aspect of the calcaneus to the plantar aspect of the calcaneus.  The tendon was then reflected off of the posterior calcaneus.  A large posterior calcaneal spur was noted.  This was excised with a osteotome and further contouring with a power rasp was then performed.  The wound was flushed with copious amounts of irrigation.  The posterior and posterior superior aspect of the calcaneus were recontoured in a much more normal position.  At this time a bulky thickened fibrotic tissue of the Achilles insertional site was noted.  This  was removed from the surgical field at this time.  The the tendon was then infiltrated with a Topaz wand proximal to distal.  The tendon splitting incision was then repaired with a 2-0 Vicryl.  Next a Magnum wire was used and a Krak?w suture type.  This was then implanted against the bone with a 5.0 mm footprint bone anchor from [employer]  Good excursion of the foot was noted.  The foot was in a neutral position.  The wound was flushed and flushed was then performed with a 3-0 Vicryl for the peritenon and subcutaneous tissue and a 4-0 Monocryl for the skin.  A bulky sterile dressing was applied and patient was placed in neutral position in an equalizer walker boot. ? ?  Patient tolerated the procedure and anesthesia well.  Was transported from the OR to the PACU with all vital signs stable and vascular status intact. To be discharged per routine protocol.  Will follow up in approximately 1 week in the outpatient clinic. ? ?

## 2021-10-26 NOTE — Anesthesia Postprocedure Evaluation (Signed)
Anesthesia Post Note ? ?Patient: Annette [name] ? ?Procedure(s) Performed: ACHILLES TENDON REPAIR - SECONDARY (Right: Ankle) ?CALCANEAL EXOSTECTOMY (Right: Ankle) ? ?Patient location during evaluation: PACU ?Anesthesia Type: General ?Level of consciousness: awake and alert ?Pain management: pain level controlled ?Vital Signs Assessment: post-procedure vital signs reviewed and stable ?Respiratory status: spontaneous breathing, nonlabored ventilation, respiratory function stable and patient connected to nasal cannula oxygen ?Cardiovascular status: blood pressure returned to baseline and stable ?Postop Assessment: no apparent nausea or vomiting ?Anesthetic complications: no ? ? ?No notable events documented. ? ? ?Last Vitals:  ?Vitals:  ? 10/25/21 0951 10/25/21 1001  ?BP: (!) 143/64 140/68  ?Pulse: 80 71  ?Resp: 16 16  ?Temp: 36.6 ?C (!) 35.6 ?C  ?SpO2: 98% 95%  ?  ?Last Pain:  ?Vitals:  ? 10/25/21 1001  ?TempSrc: Temporal  ?PainSc: 1   ? ? ?  ?  ?  ?  ?  ?  ? ?Martha Clan ? ? ? ? ?

## 2021-10-27 ENCOUNTER — Other Ambulatory Visit: Payer: Self-pay | Admitting: Podiatry

## 2021-10-27 MED ORDER — OXYCODONE HCL 5 MG PO TABS: 5.0000 mg | ORAL_TABLET | Freq: Three times a day (TID) | ORAL | 0 refills | Status: DC | PRN

## 2021-10-27 MED ORDER — ONDANSETRON HCL 4 MG PO TABS: 4.0000 mg | ORAL_TABLET | Freq: Every day | ORAL | 1 refills | Status: DC | PRN

## 2022-02-17 DIAGNOSIS — R7303 Prediabetes: Secondary | ICD-10-CM | POA: Insufficient documentation

## 2022-03-21 ENCOUNTER — Telehealth: Payer: Self-pay

## 2022-03-21 ENCOUNTER — Other Ambulatory Visit: Payer: Self-pay

## 2022-03-21 DIAGNOSIS — Z86010 Personal history of colonic polyps: Secondary | ICD-10-CM

## 2022-03-21 NOTE — Telephone Encounter (Signed)
Gastroenterology Pre-Procedure Review  Request Date: 07/11/22 Requesting Physician: Dr. Allen Norris  PATIENT REVIEW QUESTIONS: The patient responded to the following health history questions as indicated:    1. Are you having any GI issues? no 2. Do you have a personal history of Polyps? yes 3. Do you have a family history of Colon Cancer or Polyps? yes (2019) 4. Diabetes Mellitus? no 5. Joint replacements in the past 12 months?yes (November 29, 2020) 6. Major health problems in the past 3 months?no 7. Any artificial heart valves, MVP, or defibrillator?no    MEDICATIONS & ALLERGIES:    Patient reports the following regarding taking any anticoagulation/antiplatelet therapy:   Plavix, Coumadin, Eliquis, Xarelto, Lovenox, Pradaxa, Brilinta, or Effient? no Aspirin? yes (ASA '81mg'$ )  Patient confirms/reports the following medications:  Current Outpatient Medications  Medication Sig Dispense Refill   ALPRAZolam (XANAX) 0.25 MG tablet Take 0.25 mg by mouth at bedtime.     alum & mag hydroxide-simeth (MAALOX/MYLANTA) 200-200-20 MG/5ML suspension Take 15 mLs by mouth at bedtime as needed for indigestion or heartburn.     aspirin EC 81 MG tablet Take 81 mg by mouth daily. Swallow whole.     aspirin-acetaminophen-caffeine (EXCEDRIN EXTRA STRENGTH) 250-250-65 MG tablet Take 2 tablets by mouth daily.     atorvastatin (LIPITOR) 80 MG tablet Take 80 mg by mouth at bedtime.     Calcium Carb-Cholecalciferol (CALCIUM 600 + D PO) Take 1 tablet by mouth daily.     citalopram (CELEXA) 20 MG tablet Take 10 mg by mouth every morning. am     clobetasol ointment (TEMOVATE) 0.10 % Apply 1 application topically 2 (two) times daily as needed (psoriasis).     fluticasone (FLONASE) 50 MCG/ACT nasal spray Place 1 spray into both nostrils daily as needed for allergies or rhinitis.     levothyroxine (SYNTHROID, LEVOTHROID) 50 MCG tablet Take 50 mcg by mouth daily before breakfast.     ondansetron (ZOFRAN) 4 MG tablet Take 1  tablet (4 mg total) by mouth daily as needed for nausea or vomiting. 30 tablet 1   oxyCODONE (ROXICODONE) 5 MG immediate release tablet Take 1 tablet (5 mg total) by mouth every 8 (eight) hours as needed. 20 tablet 0   oxyCODONE-acetaminophen (PERCOCET) 5-325 MG tablet Take 1-2 tablets by mouth every 6 (six) hours as needed for severe pain. Max 6 tabs per day 30 tablet 0   Polyethyl Glycol-Propyl Glycol (SYSTANE OP) Place 1 drop into both eyes daily as needed (dry eyes).     No current facility-administered medications for this visit.    Patient confirms/reports the following allergies:  Allergies  Allergen Reactions   Penicillins Swelling and Rash   Celebrex [Celecoxib] Nausea Only and Other (See Comments)    Precipitates migraines with extended use    Doxycycline     Headache   Tramadol Other (See Comments)    headaches   Clindamycin/Lincomycin Nausea And Vomiting    headaches   Meloxicam     Causes headaches when taken for an extended period     No orders of the defined types were placed in this encounter.   AUTHORIZATION INFORMATION Primary Insurance: 1D#: Group #:  Secondary Insurance: 1D#: Group #:  SCHEDULE INFORMATION: Date: 07/11/22 Time: Location: [place]

## 2022-05-27 ENCOUNTER — Encounter (INDEPENDENT_AMBULATORY_CARE_PROVIDER_SITE_OTHER): Payer: Medicare Other

## 2022-05-27 ENCOUNTER — Ambulatory Visit (INDEPENDENT_AMBULATORY_CARE_PROVIDER_SITE_OTHER): Payer: Medicare Other | Admitting: Vascular Surgery

## 2022-06-06 ENCOUNTER — Ambulatory Visit (INDEPENDENT_AMBULATORY_CARE_PROVIDER_SITE_OTHER): Payer: Medicare Other | Admitting: Vascular Surgery

## 2022-06-06 ENCOUNTER — Encounter (INDEPENDENT_AMBULATORY_CARE_PROVIDER_SITE_OTHER): Payer: Medicare Other

## 2022-06-23 ENCOUNTER — Encounter (INDEPENDENT_AMBULATORY_CARE_PROVIDER_SITE_OTHER): Payer: Self-pay

## 2022-07-03 ENCOUNTER — Encounter: Payer: Self-pay | Admitting: Gastroenterology

## 2022-07-03 ENCOUNTER — Telehealth: Payer: Self-pay

## 2022-07-03 MED ORDER — NA SULFATE-K SULFATE-MG SULF 17.5-3.13-1.6 GM/177ML PO SOLN: 1.0000 | Freq: Once | ORAL | 0 refills | Status: AC

## 2022-07-03 NOTE — Telephone Encounter (Signed)
Pt called stating that she never received instructions for her upcoming procedure and there was not any prep sent to pharmacy  Instructions went over with pt and Rx sent through e-scribe Sent instructions via [place] email

## 2022-07-09 NOTE — Anesthesia Preprocedure Evaluation (Addendum)
Anesthesia Evaluation  Patient identified by MRN, date of birth, ID band Patient awake    Reviewed: Allergy & Precautions, NPO status , Patient's Chart, lab work & pertinent test results  History of Anesthesia Complications (+) PONV and history of anesthetic complications  Airway Mallampati: IV   Neck ROM: Full    Dental  (+) Implants   Pulmonary former smoker (quit 1986)   Pulmonary exam normal breath sounds clear to auscultation       Cardiovascular + Peripheral Vascular Disease (carotid stenosis s/p CEA 2018)  Normal cardiovascular exam Rhythm:Regular Rate:Normal  ECG 10/28/21: normal   Neuro/Psych  Headaches PSYCHIATRIC DISORDERS Anxiety Depression       GI/Hepatic ,GERD  ,,  Endo/Other  Hypothyroidism  Obesity   Renal/GU negative Renal ROS     Musculoskeletal  (+) Arthritis ,    Abdominal   Peds  Hematology negative hematology ROS (+)   Anesthesia Other Findings   Reproductive/Obstetrics                             Anesthesia Physical Anesthesia Plan  ASA: 2  Anesthesia Plan: General   Post-op Pain Management:    Induction: Intravenous  PONV Risk Score and Plan: 4 or greater and Propofol infusion, TIVA and Treatment may vary due to age or medical condition  Airway Management Planned: Natural Airway  Additional Equipment:   Intra-op Plan:   Post-operative Plan:   Informed Consent: I have reviewed the patients History and Physical, chart, labs and discussed the procedure including the risks, benefits and alternatives for the proposed anesthesia with the patient or authorized representative who has indicated his/her understanding and acceptance.       Plan Discussed with: CRNA  Anesthesia Plan Comments: (LMA/GETA backup discussed.  Patient consented for risks of anesthesia including but not limited to:  - adverse reactions to medications - damage to eyes, teeth, lips  or other oral mucosa - nerve damage due to positioning  - sore throat or hoarseness - damage to heart, brain, nerves, lungs, other parts of body or loss of life  Informed patient about role of CRNA in peri- and intra-operative care.  Patient voiced understanding.)        Anesthesia Quick Evaluation

## 2022-07-10 ENCOUNTER — Other Ambulatory Visit: Payer: Self-pay | Admitting: Internal Medicine

## 2022-07-10 DIAGNOSIS — Z1231 Encounter for screening mammogram for malignant neoplasm of breast: Secondary | ICD-10-CM

## 2022-07-11 ENCOUNTER — Ambulatory Visit
Admission: RE | Admit: 2022-07-11 | Discharge: 2022-07-11 | Disposition: A | Discharge status: Home / Self Care | Payer: Medicare Other | Source: Ambulatory Visit | Attending: Gastroenterology | Admitting: Gastroenterology

## 2022-07-11 ENCOUNTER — Ambulatory Visit: Payer: Medicare Other | Admitting: Anesthesiology

## 2022-07-11 ENCOUNTER — Other Ambulatory Visit: Payer: Self-pay

## 2022-07-11 ENCOUNTER — Encounter
Admission: RE | Disposition: A | Discharge status: Home / Self Care | Payer: Self-pay | Source: Ambulatory Visit | Attending: Gastroenterology

## 2022-07-11 ENCOUNTER — Encounter: Payer: Self-pay | Admitting: Gastroenterology

## 2022-07-11 DIAGNOSIS — Z6832 Body mass index (BMI) 32.0-32.9, adult: Secondary | ICD-10-CM | POA: Insufficient documentation

## 2022-07-11 DIAGNOSIS — K635 Polyp of colon: Secondary | ICD-10-CM | POA: Insufficient documentation

## 2022-07-11 DIAGNOSIS — D125 Benign neoplasm of sigmoid colon: Secondary | ICD-10-CM | POA: Diagnosis not present

## 2022-07-11 DIAGNOSIS — K640 First degree hemorrhoids: Secondary | ICD-10-CM | POA: Diagnosis not present

## 2022-07-11 DIAGNOSIS — Z86010 Personal history of colonic polyps: Secondary | ICD-10-CM

## 2022-07-11 DIAGNOSIS — Z87891 Personal history of nicotine dependence: Secondary | ICD-10-CM | POA: Insufficient documentation

## 2022-07-11 DIAGNOSIS — E039 Hypothyroidism, unspecified: Secondary | ICD-10-CM | POA: Diagnosis not present

## 2022-07-11 DIAGNOSIS — K6389 Other specified diseases of intestine: Secondary | ICD-10-CM | POA: Insufficient documentation

## 2022-07-11 DIAGNOSIS — E669 Obesity, unspecified: Secondary | ICD-10-CM | POA: Insufficient documentation

## 2022-07-11 DIAGNOSIS — Z09 Encounter for follow-up examination after completed treatment for conditions other than malignant neoplasm: Secondary | ICD-10-CM | POA: Insufficient documentation

## 2022-07-11 HISTORY — PX: POLYPECTOMY: SHX5525

## 2022-07-11 HISTORY — PX: COLONOSCOPY WITH PROPOFOL: SHX5780

## 2022-07-11 SURGERY — COLONOSCOPY WITH PROPOFOL
Anesthesia: General | Site: Rectum

## 2022-07-11 MED ORDER — PROPOFOL 10 MG/ML IV BOLUS
INTRAVENOUS | Status: DC | PRN
  Administered 2022-07-11 (×2): 50 mg via INTRAVENOUS
  Administered 2022-07-11: 25 mg via INTRAVENOUS
  Administered 2022-07-11: 100 mg via INTRAVENOUS
  Administered 2022-07-11: 50 mg via INTRAVENOUS

## 2022-07-11 MED ORDER — ONDANSETRON HCL 4 MG/2ML IJ SOLN: 4.0000 mg | Freq: Once | INTRAMUSCULAR | Status: DC | PRN

## 2022-07-11 MED ORDER — SODIUM CHLORIDE 0.9 % IV SOLN: INTRAVENOUS | Status: DC

## 2022-07-11 MED ORDER — LACTATED RINGERS IV SOLN: INTRAVENOUS | Status: DC

## 2022-07-11 MED ORDER — STERILE WATER FOR IRRIGATION IR SOLN
Status: DC | PRN
  Administered 2022-07-11: 100 mL

## 2022-07-11 MED ORDER — ACETAMINOPHEN 325 MG PO TABS: 650.0000 mg | ORAL_TABLET | Freq: Once | ORAL | Status: DC | PRN

## 2022-07-11 MED ORDER — ACETAMINOPHEN 160 MG/5ML PO SOLN: 325.0000 mg | ORAL | Status: DC | PRN

## 2022-07-11 SURGICAL SUPPLY — 9 items
FORCEPS BIOP RAD 4 LRG CAP 4 (CUTTING FORCEPS) IMPLANT
GOWN CVR UNV OPN BCK APRN NK (MISCELLANEOUS) ×6 IMPLANT
GOWN ISOL THUMB LOOP REG UNIV (MISCELLANEOUS) ×4
KIT PRC NS LF DISP ENDO (KITS) ×3 IMPLANT
KIT PROCEDURE OLYMPUS (KITS) ×2
MANIFOLD NEPTUNE II (INSTRUMENTS) ×3 IMPLANT
SNARE COLD EXACTO (MISCELLANEOUS) IMPLANT
TRAP ETRAP POLY (MISCELLANEOUS) IMPLANT
WATER STERILE IRR 250ML POUR (IV SOLUTION) ×3 IMPLANT

## 2022-07-11 NOTE — Op Note (Signed)
[facility] Gastroenterology Patient Name: Annette [name] Procedure Date: 07/11/2022 7:59 AM MRN: [id number] Account #: [id number] Date of Birth: 02/16/1956 Admit Type: Outpatient Age: 66 Room: [facility] OR ROOM 01 Gender: Female Note Status: Finalized Instrument Name: [id number] Procedure:             Colonoscopy Indications:           High risk colon cancer surveillance: Personal history                         of colonic polyps Providers:             Lucilla Lame MD, MD Referring MD:          Ramonita Lab, MD (Referring MD) Medicines:             Propofol per Anesthesia Complications:         No immediate complications. Procedure:             Pre-Anesthesia Assessment:                        - Prior to the procedure, a History and Physical was                         performed, and patient medications and allergies were                         reviewed. The patient's tolerance of previous                         anesthesia was also reviewed. The risks and benefits                         of the procedure and the sedation options and risks                         were discussed with the patient. All questions were                         answered, and informed consent was obtained. Prior                         Anticoagulants: The patient has taken no anticoagulant                         or antiplatelet agents. ASA Grade Assessment: II - A                         patient with mild systemic disease. After reviewing                         the risks and benefits, the patient was deemed in                         satisfactory condition to undergo the procedure.                        After obtaining informed consent, the colonoscope was  passed under direct vision. Throughout the procedure,                         the patient's blood pressure, pulse, and oxygen                         saturations were monitored continuously. The                          Colonoscope was introduced through the anus and                         advanced to the the cecum, identified by appendiceal                         orifice and ileocecal valve. The colonoscopy was                         performed without difficulty. The patient tolerated                         the procedure well. The quality of the bowel                         preparation was excellent. Findings:      The perianal and digital rectal examinations were normal.      A 3 mm polyp was found in the ascending colon. The polyp was sessile.       The polyp was removed with a cold biopsy forceps. Resection and       retrieval were complete.      Three sessile polyps were found in the sigmoid colon. The polyps were 4       to 7 mm in size. These polyps were removed with a cold snare. Resection       and retrieval were complete.      A 2 mm polyp was found in the sigmoid colon. The polyp was sessile. The       polyp was removed with a cold biopsy forceps. Resection and retrieval       were complete.      Non-bleeding internal hemorrhoids were found during retroflexion. The       hemorrhoids were Grade I (internal hemorrhoids that do not prolapse). Impression:            - One 3 mm polyp in the ascending colon, removed with                         a cold biopsy forceps. Resected and retrieved.                        - Three 4 to 7 mm polyps in the sigmoid colon, removed                         with a cold snare. Resected and retrieved.                        - One 2 mm polyp in the sigmoid colon, removed with a  cold biopsy forceps. Resected and retrieved.                        - Non-bleeding internal hemorrhoids. Recommendation:        - Discharge patient to home.                        - Resume previous diet.                        - Continue present medications.                        - Await pathology results.                        - Repeat colonoscopy in 5 years  for surveillance. Procedure Code(s):     --- Professional ---                        [phone], Colonoscopy, flexible; with removal of                         tumor(s), polyp(s), or other lesion(s) by snare                         technique                        45380, 26, Colonoscopy, flexible; with biopsy, single                         or multiple Diagnosis Code(s):     --- Professional ---                        Z86.010, Personal history of colonic polyps                        D12.2, Benign neoplasm of ascending colon CPT copyright 2022 American Medical Association. All rights reserved. The codes documented in this report are preliminary and upon coder review may  be revised to meet current compliance requirements. Lucilla Lame MD, MD 07/11/2022 8:29:01 AM This report has been signed electronically. Number of Addenda: 0 Note Initiated On: 07/11/2022 7:59 AM Scope Withdrawal Time: 0 hours 12 minutes 33 seconds  Total Procedure Duration: 0 hours 16 minutes 36 seconds  Estimated Blood Loss:  Estimated blood loss: none.      [facility]

## 2022-07-11 NOTE — Anesthesia Postprocedure Evaluation (Signed)
Anesthesia Post Note  Patient: Annette [name]  Procedure(s) Performed: COLONOSCOPY WITH BIOPSY (Rectum) POLYPECTOMY (Rectum)  Patient location during evaluation: PACU Anesthesia Type: General Level of consciousness: awake and alert, oriented and patient cooperative Pain management: pain level controlled Vital Signs Assessment: post-procedure vital signs reviewed and stable Respiratory status: spontaneous breathing, nonlabored ventilation and respiratory function stable Cardiovascular status: blood pressure returned to baseline and stable Postop Assessment: adequate PO intake Anesthetic complications: no   No notable events documented.   Last Vitals:  Vitals:   07/11/22 0830 07/11/22 0835  BP: 125/61 135/61  Pulse: 65 70  Resp: (!) 21 15  Temp: 36.7 C   SpO2: 97% 98%    Last Pain:  Vitals:   07/11/22 0830  TempSrc:   PainSc: 0-No pain                 Darrin Nipper

## 2022-07-11 NOTE — Transfer of Care (Signed)
Immediate Anesthesia Transfer of Care Note  Patient: Annette [name]  Procedure(s) Performed: COLONOSCOPY WITH BIOPSY (Rectum) POLYPECTOMY (Rectum)  Patient Location: PACU  Anesthesia Type: General  Level of Consciousness: awake, alert  and patient cooperative  Airway and Oxygen Therapy: Patient Spontanous Breathing and Patient connected to supplemental oxygen  Post-op Assessment: Post-op Vital signs reviewed, Patient's Cardiovascular Status Stable, Respiratory Function Stable, Patent Airway and No signs of Nausea or vomiting  Post-op Vital Signs: Reviewed and stable  Complications: No notable events documented.

## 2022-07-11 NOTE — H&P (Signed)
Annette Lame, MD [address] [place], [place] [address] Phone:[phone] Fax : [phone]  Primary Care Physician:  Adin Hector, MD Primary Gastroenterologist:  Dr. Allen Norris  Pre-Procedure History & Physical: HPI:  Annette [name] is a 66 y.o. female is here for an colonoscopy.   Past Medical History:  Diagnosis Date   Anemia    Jan slightly anemic   Anxiety    Arthritis    FINGERS, knees   Carotid artery stenosis    Complication of anesthesia    nausea, headaches, achy legs   Family history of adverse reaction to anesthesia    SISTERS BOTH GET NAUSEATED   GERD (gastroesophageal reflux disease)    Headache 05/2017   migraines - precipitated by extended use of celebrex/meloxicam   History of 2019 novel coronavirus disease (COVID-19) 07/11/2020   Hyperlipidemia    Hypothyroidism 04/2009   s/p ablation/ GRAVES DISEASE   Neuromuscular disorder (HCC)    NUMBNESS IN HANDS OR LIPS OCCASIONALLY/ carpal tunnel   PONV (postoperative nausea and vomiting)    AFTER 11-29-20 SURGERY   Psoriasis     Past Surgical History:  Procedure Laterality Date   ABDOMINAL HYSTERECTOMY  03/2007   ACHILLES TENDON SURGERY Right 10/25/2021   Procedure: ACHILLES TENDON REPAIR - SECONDARY;  Surgeon: Samara Deist, DPM;  Location: [facility];  Service: Podiatry;  Laterality: Right;   CATARACT EXTRACTION W/PHACO Left 06/16/2018   Procedure: CATARACT EXTRACTION PHACO AND INTRAOCULAR LENS PLACEMENT ([place]) LEFT;  Surgeon: Leandrew Koyanagi, MD;  Location: [place];  Service: Ophthalmology;  Laterality: Left;   CATARACT EXTRACTION W/PHACO Right 07/27/2019   Procedure: CATARACT EXTRACTION PHACO AND INTRAOCULAR LENS PLACEMENT ([facility]) RIGHT TECNIS ADD 6.17, 00:56.9, 27.1%;  Surgeon: Leandrew Koyanagi, MD;  Location: [place];  Service: Ophthalmology;  Laterality: Right;   CESAREAN SECTION     x2   COLONOSCOPY WITH PROPOFOL N/A 10/20/2016   Procedure: COLONOSCOPY  WITH PROPOFOL;  Surgeon: Annette Lame, MD;  Location: [place];  Service: Endoscopy;  Laterality: N/A;   COLONOSCOPY WITH PROPOFOL N/A 01/11/2018   Procedure: COLONOSCOPY WITH PROPOFOL;  Surgeon: Annette Lame, MD;  Location: [place];  Service: Endoscopy;  Laterality: N/A;   DILATION AND CURETTAGE OF UTERUS     ENDARTERECTOMY Right 06/24/2017   Procedure: ENDARTERECTOMY CAROTID;  Surgeon: Algernon Huxley, MD;  Location: [facility];  Service: Vascular;  Laterality: Right;   KNEE ARTHROSCOPY Left 09/14/2015   Procedure: ARTHROSCOPY KNEE;  Surgeon: Leanor Kail, MD;  Location: [facility];  Service: Orthopedics;  Laterality: Left;   KNEE ARTHROSCOPY Left 01/15/2021   Procedure: Left knee arthroscopy for arthrofibrosis, extensive synovectomy;  Surgeon: Hessie Knows, MD;  Location: [facility];  Service: Orthopedics;  Laterality: Left;   KNEE ARTHROSCOPY W/ PARTIAL MEDIAL MENISCECTOMY Right 05/12/2014   KNEE CLOSED REDUCTION Left 01/01/2021   Procedure: CLOSED MANIPULATION KNEE;  Surgeon: Hessie Knows, MD;  Location: [facility];  Service: Orthopedics;  Laterality: Left;   KNEE CLOSED REDUCTION Left 04/30/2021   Procedure: CLOSED MANIPULATION KNEE;  Surgeon: Hessie Knows, MD;  Location: [facility];  Service: Orthopedics;  Laterality: Left;   OSTECTOMY Right 10/25/2021   Procedure: CALCANEAL EXOSTECTOMY;  Surgeon: Samara Deist, DPM;  Location: [facility];  Service: Podiatry;  Laterality: Right;   POLYPECTOMY  10/20/2016   Procedure: POLYPECTOMY;  Surgeon: Annette Lame, MD;  Location: [place];  Service: Endoscopy;;   POLYPECTOMY  01/11/2018   Procedure: POLYPECTOMY;  Surgeon: Allen Norris,  Annette Sallade, MD;  Location: [place];  Service: Endoscopy;;   THYROID SURGERY  2010   ablation   TOTAL KNEE ARTHROPLASTY Left 11/29/2020   Procedure: Left total knee arthroplasty - Rachelle Hora to Assist;  Surgeon: Hessie Knows, MD;  Location: [facility];  Service: Orthopedics;   Laterality: Left;   TOTAL KNEE REVISION Left 03/12/2021   Procedure: Revision of femoral component of total knee, left - RNFA;  Surgeon: Hessie Knows, MD;  Location: [facility];  Service: Orthopedics;  Laterality: Left;    Prior to Admission medications   Medication Sig Start Date End Date Taking? Authorizing Provider  alum & mag hydroxide-simeth (MAALOX/MYLANTA) 200-200-20 MG/5ML suspension Take 15 mLs by mouth at bedtime as needed for indigestion or heartburn.   Yes [provider]  aspirin EC 81 MG tablet Take 81 mg by mouth daily. Swallow whole.   Yes [provider]  aspirin-acetaminophen-caffeine (EXCEDRIN EXTRA STRENGTH) [phone] MG tablet Take 2 tablets by mouth daily.   Yes [provider]  atorvastatin (LIPITOR) 80 MG tablet Take 80 mg by mouth at bedtime.   Yes [provider]  Azelastine HCl 137 MCG/SPRAY SOLN Place into both nostrils. 10/26/21  Yes [provider]  Calcium Carb-Cholecalciferol (CALCIUM 600 + D PO) Take 1 tablet by mouth daily.   Yes [provider]  citalopram (CELEXA) 20 MG tablet Take 10 mg by mouth every morning. am   Yes [provider]  clobetasol ointment (TEMOVATE) 2.99 % Apply 1 application topically 2 (two) times daily as needed (psoriasis). 06/07/20  Yes [provider]  fluticasone (FLONASE) 50 MCG/ACT nasal spray Place 1 spray into both nostrils daily as needed for allergies or rhinitis.   Yes [provider]  levothyroxine (SYNTHROID, LEVOTHROID) 50 MCG tablet Take 50 mcg by mouth daily before breakfast.   Yes [provider]  Polyethyl Glycol-Propyl Glycol (SYSTANE OP) Place 1 drop into both eyes daily as needed (dry eyes).   Yes [provider]  albuterol (VENTOLIN HFA) 108 (90 Base) MCG/ACT inhaler SMARTSIG:2 Puff(s) By Mouth Every 6 Hours PRN Patient not taking: Reported on 07/03/2022 12/08/21   [provider]  ALPRAZolam Duanne Moron) 0.25 MG tablet  Take 0.25 mg by mouth at bedtime.    [provider]  ondansetron (ZOFRAN) 4 MG tablet Take 1 tablet (4 mg total) by mouth daily as needed for nausea or vomiting. Patient not taking: Reported on 07/03/2022 10/27/21 10/27/22  Samara Deist, DPM    Allergies as of 03/21/2022 - Review Complete 10/25/2021  Allergen Reaction Noted   Penicillins Swelling and Rash 09/11/2015   Celebrex [celecoxib] Nausea Only and Other (See Comments) 09/11/2015   Doxycycline  11/05/2017   Tramadol Other (See Comments) 04/30/2021   Clindamycin/lincomycin Nausea And Vomiting 09/11/2015   Meloxicam  12/31/2020    Family History  Problem Relation Age of Onset   Breast cancer Paternal Grandmother 30   Breast cancer Maternal Grandmother 84   Breast cancer Sister 53    Social History   Socioeconomic History   Marital status: Married    Spouse name: Not on file   Number of children: Not on file   Years of education: Not on file   Highest education level: Not on file  Occupational History   Not on file  Tobacco Use   Smoking status: Former    Packs/day: 1.00    Years: 12.00    Total pack years: 12.00    Types: Cigarettes  Quit date: 06/25/1985    Years since quitting: 37.0   Smokeless tobacco: Never  Vaping Use   Vaping Use: Never used  Substance and Sexual Activity   Alcohol use: Yes    Comment: rare wine   Drug use: No   Sexual activity: Not on file  Other Topics Concern   Not on file  Social History Narrative   Not on file   Social Determinants of Health   Financial Resource Strain: Not on file  Food Insecurity: Not on file  Transportation Needs: Not on file  Physical Activity: Not on file  Stress: Not on file  Social Connections: Not on file  Intimate Partner Violence: Not on file    Review of Systems: See HPI, otherwise negative ROS  Physical Exam: BP (!) 141/72   Pulse 76   Temp (!) 97.5 F (36.4 C) (Temporal)   Resp 18   Ht 5' (1.524 m)   Wt 75.2 kg   SpO2 97%    BMI 32.38 kg/m  General:   Alert,  pleasant and cooperative in NAD Head:  Normocephalic and atraumatic. Neck:  Supple; no masses or thyromegaly. Lungs:  Clear throughout to auscultation.    Heart:  Regular rate and rhythm. Abdomen:  Soft, nontender and nondistended. Normal bowel sounds, without guarding, and without rebound.   Neurologic:  Alert and  oriented x4;  grossly normal neurologically.  Impression/Plan: MADAILEIN LONDO is here for an colonoscopy to be performed for a history of adenomatous polyps on 2019   Risks, benefits, limitations, and alternatives regarding  colonoscopy have been reviewed with the patient.  Questions have been answered.  All parties agreeable.   Annette Lame, MD  07/11/2022, 7:14 AM

## 2022-07-14 ENCOUNTER — Encounter: Payer: Self-pay | Admitting: Gastroenterology

## 2022-07-14 ENCOUNTER — Other Ambulatory Visit (INDEPENDENT_AMBULATORY_CARE_PROVIDER_SITE_OTHER): Payer: Self-pay | Admitting: Vascular Surgery

## 2022-07-14 DIAGNOSIS — I6523 Occlusion and stenosis of bilateral carotid arteries: Secondary | ICD-10-CM

## 2022-07-15 ENCOUNTER — Encounter (INDEPENDENT_AMBULATORY_CARE_PROVIDER_SITE_OTHER): Payer: Self-pay | Admitting: Vascular Surgery

## 2022-07-15 ENCOUNTER — Ambulatory Visit (INDEPENDENT_AMBULATORY_CARE_PROVIDER_SITE_OTHER): Payer: Medicare Other

## 2022-07-15 ENCOUNTER — Ambulatory Visit (INDEPENDENT_AMBULATORY_CARE_PROVIDER_SITE_OTHER): Payer: Medicare Other | Admitting: Vascular Surgery

## 2022-07-15 VITALS — BP 152/61 | HR 51 | Resp 18 | Ht 60.0 in | Wt 169.2 lb

## 2022-07-15 DIAGNOSIS — I6523 Occlusion and stenosis of bilateral carotid arteries: Secondary | ICD-10-CM | POA: Diagnosis not present

## 2022-07-15 DIAGNOSIS — H93A1 Pulsatile tinnitus, right ear: Secondary | ICD-10-CM | POA: Diagnosis not present

## 2022-07-15 DIAGNOSIS — E785 Hyperlipidemia, unspecified: Secondary | ICD-10-CM | POA: Diagnosis not present

## 2022-07-15 LAB — SURGICAL PATHOLOGY

## 2022-07-15 NOTE — Assessment & Plan Note (Signed)
Duplex today shows normal velocities in her right ICA status post endarterectomy with stable 1 to 39% left ICA stenosis.  Doing well.  No change in medical regimen.  Recheck in 1 year

## 2022-07-15 NOTE — Progress Notes (Signed)
MRN : [id number]  Annette [name] is a 66 y.o. ([birth year]) female who presents with chief complaint of No chief complaint on file. Marland Kitchen  History of Present Illness: Patient returns in follow-up of her carotid disease.  She is status post right carotid endarterectomy in the past.  She is doing well today.  She denies any focal neurologic symptoms.  Duplex today shows normal velocities in her right ICA status post endarterectomy with stable 1 to 39% left ICA stenosis.  Current Outpatient Medications  Medication Sig Dispense Refill   ALPRAZolam (XANAX) 0.25 MG tablet Take 0.25 mg by mouth at bedtime.     alum & mag hydroxide-simeth (MAALOX/MYLANTA) 200-200-20 MG/5ML suspension Take 15 mLs by mouth at bedtime as needed for indigestion or heartburn.     aspirin EC 81 MG tablet Take 81 mg by mouth daily. Swallow whole.     aspirin-acetaminophen-caffeine (EXCEDRIN EXTRA STRENGTH) 250-250-65 MG tablet Take 2 tablets by mouth daily.     atorvastatin (LIPITOR) 80 MG tablet Take 80 mg by mouth at bedtime.     Calcium Carb-Cholecalciferol (CALCIUM 600 + D PO) Take 1 tablet by mouth daily.     citalopram (CELEXA) 20 MG tablet Take 10 mg by mouth every morning. am     clobetasol ointment (TEMOVATE) 2.70 % Apply 1 application topically 2 (two) times daily as needed (psoriasis).     fluticasone (FLONASE) 50 MCG/ACT nasal spray Place 1 spray into both nostrils daily as needed for allergies or rhinitis.     levothyroxine (SYNTHROID, LEVOTHROID) 50 MCG tablet Take 50 mcg by mouth daily before breakfast.     Polyethyl Glycol-Propyl Glycol (SYSTANE OP) Place 1 drop into both eyes daily as needed (dry eyes).     No current facility-administered medications for this visit.    Past Medical History:  Diagnosis Date   Anemia    Jan slightly anemic   Anxiety    Arthritis    FINGERS, knees   Carotid artery stenosis    Complication of anesthesia    nausea, headaches, achy legs   Family history of adverse  reaction to anesthesia    SISTERS BOTH GET NAUSEATED   GERD (gastroesophageal reflux disease)    Headache 05/2017   migraines - precipitated by extended use of celebrex/meloxicam   History of 2019 novel coronavirus disease (COVID-19) 07/11/2020   Hyperlipidemia    Hypothyroidism 04/2009   s/p ablation/ GRAVES DISEASE   Neuromuscular disorder (HCC)    NUMBNESS IN HANDS OR LIPS OCCASIONALLY/ carpal tunnel   PONV (postoperative nausea and vomiting)    AFTER 11-29-20 SURGERY   Psoriasis     Past Surgical History:  Procedure Laterality Date   ABDOMINAL HYSTERECTOMY  03/2007   ACHILLES TENDON SURGERY Right 10/25/2021   Procedure: ACHILLES TENDON REPAIR - SECONDARY;  Surgeon: Samara Deist, DPM;  Location: [facility];  Service: Podiatry;  Laterality: Right;   CATARACT EXTRACTION W/PHACO Left 06/16/2018   Procedure: CATARACT EXTRACTION PHACO AND INTRAOCULAR LENS PLACEMENT ([place]) LEFT;  Surgeon: Leandrew Koyanagi, MD;  Location: [place];  Service: Ophthalmology;  Laterality: Left;   CATARACT EXTRACTION W/PHACO Right 07/27/2019   Procedure: CATARACT EXTRACTION PHACO AND INTRAOCULAR LENS PLACEMENT ([facility]) RIGHT TECNIS ADD 6.17, 00:56.9, 27.1%;  Surgeon: Leandrew Koyanagi, MD;  Location: [place];  Service: Ophthalmology;  Laterality: Right;   CESAREAN SECTION     x2   COLONOSCOPY WITH PROPOFOL N/A 10/20/2016   Procedure: COLONOSCOPY WITH PROPOFOL;  Surgeon: Lucilla Lame, MD;  Location: [place];  Service: Endoscopy;  Laterality: N/A;   COLONOSCOPY WITH PROPOFOL N/A 01/11/2018   Procedure: COLONOSCOPY WITH PROPOFOL;  Surgeon: Lucilla Lame, MD;  Location: [place];  Service: Endoscopy;  Laterality: N/A;   COLONOSCOPY WITH PROPOFOL N/A 07/11/2022   Procedure: COLONOSCOPY WITH BIOPSY;  Surgeon: Lucilla Lame, MD;  Location: [place];  Service: Endoscopy;  Laterality: N/A;   DILATION AND CURETTAGE OF UTERUS     ENDARTERECTOMY Right 06/24/2017    Procedure: ENDARTERECTOMY CAROTID;  Surgeon: Algernon Huxley, MD;  Location: [facility];  Service: Vascular;  Laterality: Right;   KNEE ARTHROSCOPY Left 09/14/2015   Procedure: ARTHROSCOPY KNEE;  Surgeon: Leanor Kail, MD;  Location: [place];  Service: Orthopedics;  Laterality: Left;   KNEE ARTHROSCOPY Left 01/15/2021   Procedure: Left knee arthroscopy for arthrofibrosis, extensive synovectomy;  Surgeon: Hessie Knows, MD;  Location: [facility];  Service: Orthopedics;  Laterality: Left;   KNEE ARTHROSCOPY W/ PARTIAL MEDIAL MENISCECTOMY Right 05/12/2014   KNEE CLOSED REDUCTION Left 01/01/2021   Procedure: CLOSED MANIPULATION KNEE;  Surgeon: Hessie Knows, MD;  Location: [facility];  Service: Orthopedics;  Laterality: Left;   KNEE CLOSED REDUCTION Left 04/30/2021   Procedure: CLOSED MANIPULATION KNEE;  Surgeon: Hessie Knows, MD;  Location: [facility];  Service: Orthopedics;  Laterality: Left;   OSTECTOMY Right 10/25/2021   Procedure: CALCANEAL EXOSTECTOMY;  Surgeon: Samara Deist, DPM;  Location: [facility];  Service: Podiatry;  Laterality: Right;   POLYPECTOMY  10/20/2016   Procedure: POLYPECTOMY;  Surgeon: Lucilla Lame, MD;  Location: [place];  Service: Endoscopy;;   POLYPECTOMY  01/11/2018   Procedure: POLYPECTOMY;  Surgeon: Lucilla Lame, MD;  Location: [place];  Service: Endoscopy;;   POLYPECTOMY  07/11/2022   Procedure: POLYPECTOMY;  Surgeon: Lucilla Lame, MD;  Location: [place];  Service: Endoscopy;;   THYROID SURGERY  2010   ablation   TOTAL KNEE ARTHROPLASTY Left 11/29/2020   Procedure: Left total knee arthroplasty - Rachelle Hora to Assist;  Surgeon: Hessie Knows, MD;  Location: [facility];  Service: Orthopedics;  Laterality: Left;   TOTAL KNEE REVISION Left 03/12/2021   Procedure: Revision of femoral component of total knee, left - RNFA;  Surgeon: Hessie Knows, MD;  Location: [facility];  Service: Orthopedics;  Laterality: Left;     Social History    Tobacco Use   Smoking status: Former    Packs/day: 1.00    Years: 12.00    Total pack years: 12.00    Types: Cigarettes    Quit date: 06/25/1985    Years since quitting: 37.0   Smokeless tobacco: Never  Vaping Use   Vaping Use: Never used  Substance Use Topics   Alcohol use: Yes    Comment: rare wine   Drug use: No      Family History  Problem Relation Age of Onset   Breast cancer Paternal Grandmother 12   Breast cancer Maternal Grandmother 60   Breast cancer Sister 27     Allergies  Allergen Reactions   Penicillins Swelling and Rash   Celebrex [Celecoxib] Nausea Only and Other (See Comments)    Precipitates migraines with extended use    Doxycycline     Headache   Tramadol Other (See Comments)    headaches   Clindamycin/Lincomycin Nausea And Vomiting    headaches   Meloxicam     Causes headaches when taken for an extended period     REVIEW OF SYSTEMS (Negative unless  checked)   Constitutional: '[]'$ Weight loss  '[]'$ Fever  '[]'$ Chills Cardiac: '[]'$ Chest pain   '[]'$ Chest pressure   '[]'$ Palpitations   '[]'$ Shortness of breath when laying flat   '[]'$ Shortness of breath at rest   '[]'$ Shortness of breath with exertion. Vascular:  '[]'$ Pain in legs with walking   '[]'$ Pain in legs at rest   '[]'$ Pain in legs when laying flat   '[]'$ Claudication   '[]'$ Pain in feet when walking  '[]'$ Pain in feet at rest  '[]'$ Pain in feet when laying flat   '[]'$ History of DVT   '[]'$ Phlebitis   '[]'$ Swelling in legs   '[]'$ Varicose veins   '[]'$ Non-healing ulcers Pulmonary:   '[]'$ Uses home oxygen   '[]'$ Productive cough   '[]'$ Hemoptysis   '[]'$ Wheeze  '[]'$ COPD   '[]'$ Asthma Neurologic:  '[x]'$ Dizziness  '[]'$ Blackouts   '[]'$ Seizures   '[]'$ History of stroke   '[]'$ History of TIA  '[]'$ Aphasia   '[]'$ Temporary blindness   '[]'$ Dysphagia   '[]'$ Weakness or numbness in arms   '[]'$ Weakness or numbness in legs Musculoskeletal:  '[]'$ Arthritis   '[]'$ Joint swelling   '[]'$ Joint pain   '[]'$ Low back pain Hematologic:  '[]'$ Easy bruising  '[]'$ Easy bleeding   '[]'$ Hypercoagulable state   '[]'$ Anemic   '[]'$ Hepatitis Gastrointestinal:  '[]'$ Blood in stool   '[]'$ Vomiting blood  '[]'$ Gastroesophageal reflux/heartburn   '[]'$ Difficulty swallowing. Genitourinary:  '[]'$ Chronic kidney disease   '[]'$ Difficult urination  '[]'$ Frequent urination  '[]'$ Burning with urination   '[]'$ Blood in urine Skin:  '[]'$ Rashes   '[]'$ Ulcers   '[]'$ Wounds Psychological:  '[x]'$ History of anxiety   '[]'$  History of major depression.  Physical Examination  Vitals:   07/15/22 1147  BP: (!) 152/61  Pulse: (!) 51  Resp: 18  Weight: 169 lb 3.2 oz (76.7 kg)  Height: 5' (1.524 m)   Body mass index is 33.04 kg/m. Gen:  WD/WN, NAD Head: [place]/AT, No temporalis wasting. Ear/Nose/Throat: Hearing grossly intact, nares w/o erythema or drainage, trachea midline Eyes: Conjunctiva clear. Sclera non-icteric Neck: Supple.  No bruit  Pulmonary:  Good air movement, equal and clear to auscultation bilaterally.  Cardiac: RRR, No JVD Vascular:  Vessel Right Left  Radial Palpable Palpable       Musculoskeletal: M/S 5/5 throughout.  No deformity or atrophy. No edema. Neurologic: CN 2-12 intact. Sensation grossly intact in extremities.  Symmetrical.  Speech is fluent. Motor exam as listed above. Psychiatric: Judgment intact, Mood & affect appropriate for pt's clinical situation. Dermatologic: No rashes or ulcers noted.  No cellulitis or open wounds. Right CEA incision is well healed     CBC Lab Results  Component Value Date   WBC 10.6 (H) 03/13/2021   HGB 11.4 (L) 03/13/2021   HCT 33.3 (L) 03/13/2021   MCV 94.1 03/13/2021   PLT 269 03/13/2021    BMET    Component Value Date/Time   NA 138 03/13/2021 0430   K 4.8 03/13/2021 0430   CL 104 03/13/2021 0430   CO2 26 03/13/2021 0430   GLUCOSE 147 (H) 03/13/2021 0430   BUN 11 03/13/2021 0430   CREATININE 0.63 03/13/2021 0430   CALCIUM 9.2 03/13/2021 0430   GFRNONAA >60 03/13/2021 0430   GFRAA >60 06/25/2017 0535   CrCl cannot be calculated (Patient's most recent lab result is older than the maximum 21  days allowed.).  COAG Lab Results  Component Value Date   INR 0.97 06/17/2017    Radiology No results found.   Assessment/Plan Hyperlipidemia, unspecified lipid control important in reducing the progression of atherosclerotic disease. Continue statin therapy     Pulsatile tinnitus of right  ear Likely secondary to carotid disease. Better after CEA  Carotid artery stenosis Duplex today shows normal velocities in her right ICA status post endarterectomy with stable 1 to 39% left ICA stenosis.  Doing well.  No change in medical regimen.  Recheck in 1 year    Leotis Pain, MD  07/15/2022 1:30 PM    This note was created with Dragon medical transcription system.  Any errors from dictation are purely unintentional

## 2022-07-16 ENCOUNTER — Encounter: Payer: Self-pay | Admitting: Gastroenterology

## 2022-09-05 ENCOUNTER — Ambulatory Visit
Admission: RE | Admit: 2022-09-05 | Discharge: 2022-09-05 | Disposition: A | Discharge status: Home / Self Care | Payer: Medicare Other | Source: Ambulatory Visit | Attending: Internal Medicine | Admitting: Internal Medicine

## 2022-09-05 DIAGNOSIS — Z1231 Encounter for screening mammogram for malignant neoplasm of breast: Secondary | ICD-10-CM | POA: Diagnosis not present

## 2022-09-08 IMAGING — DX DG KNEE 1-2V*L*
2 series · 2 of 2 positions shown · non-contrast
Comparison: 11/29/2020

CLINICAL DATA: Left total knee arthroplasty

EXAM:
LEFT KNEE - 1-2 VIEW

[knee ap]
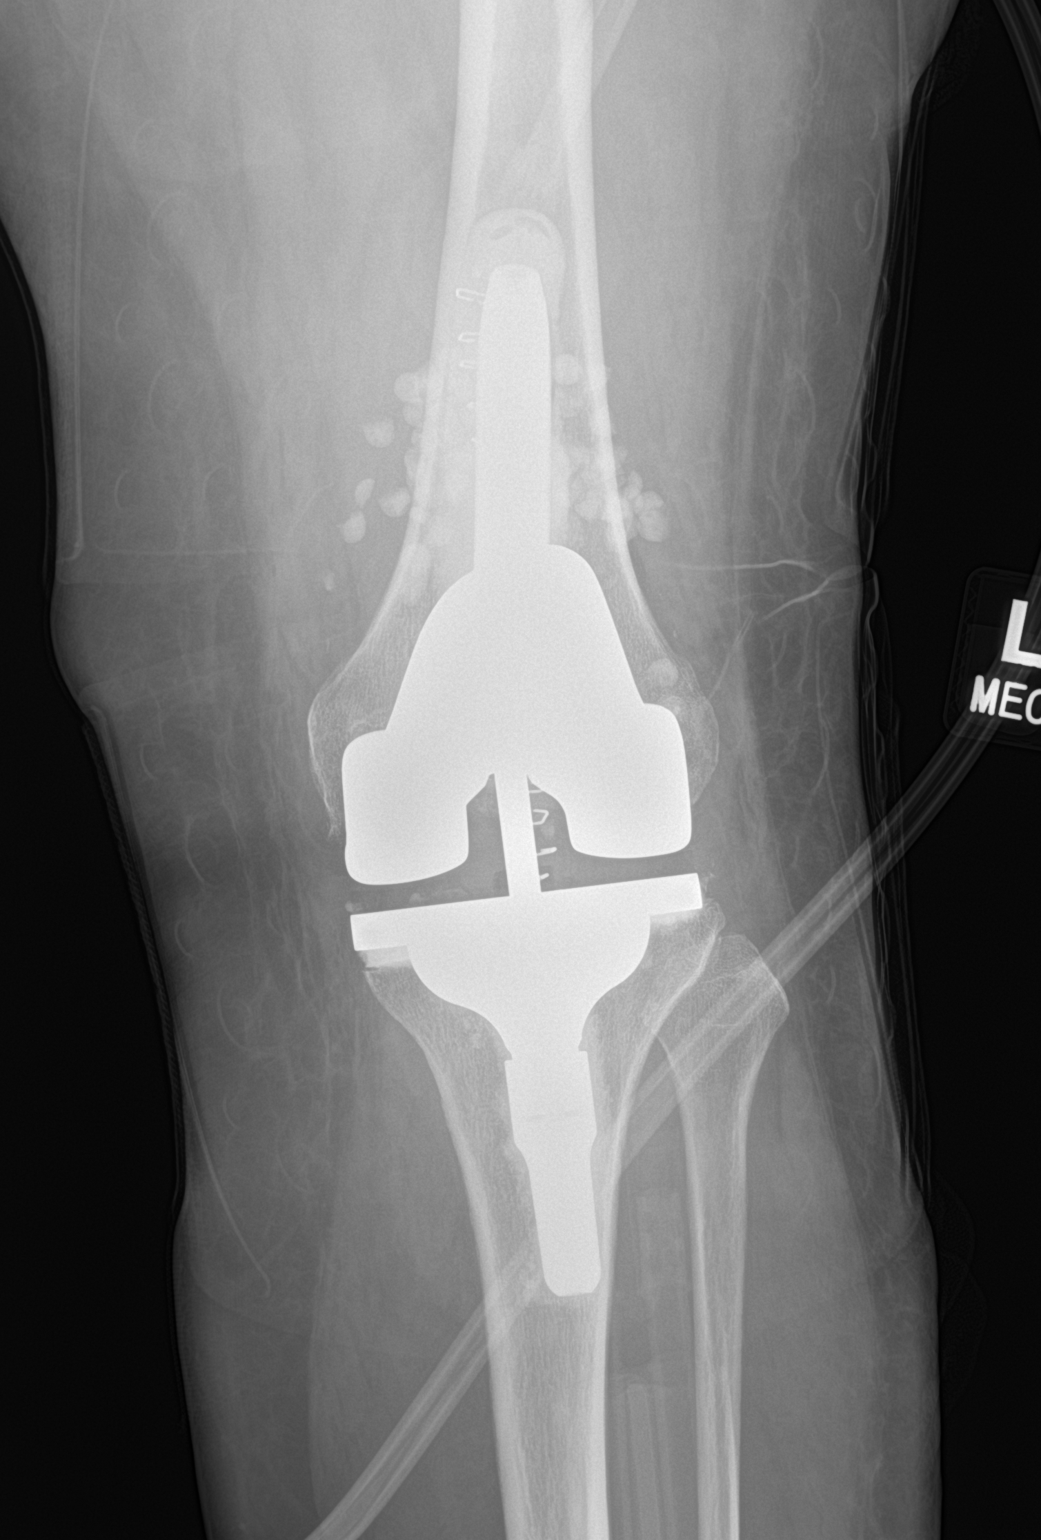

[knee lat]
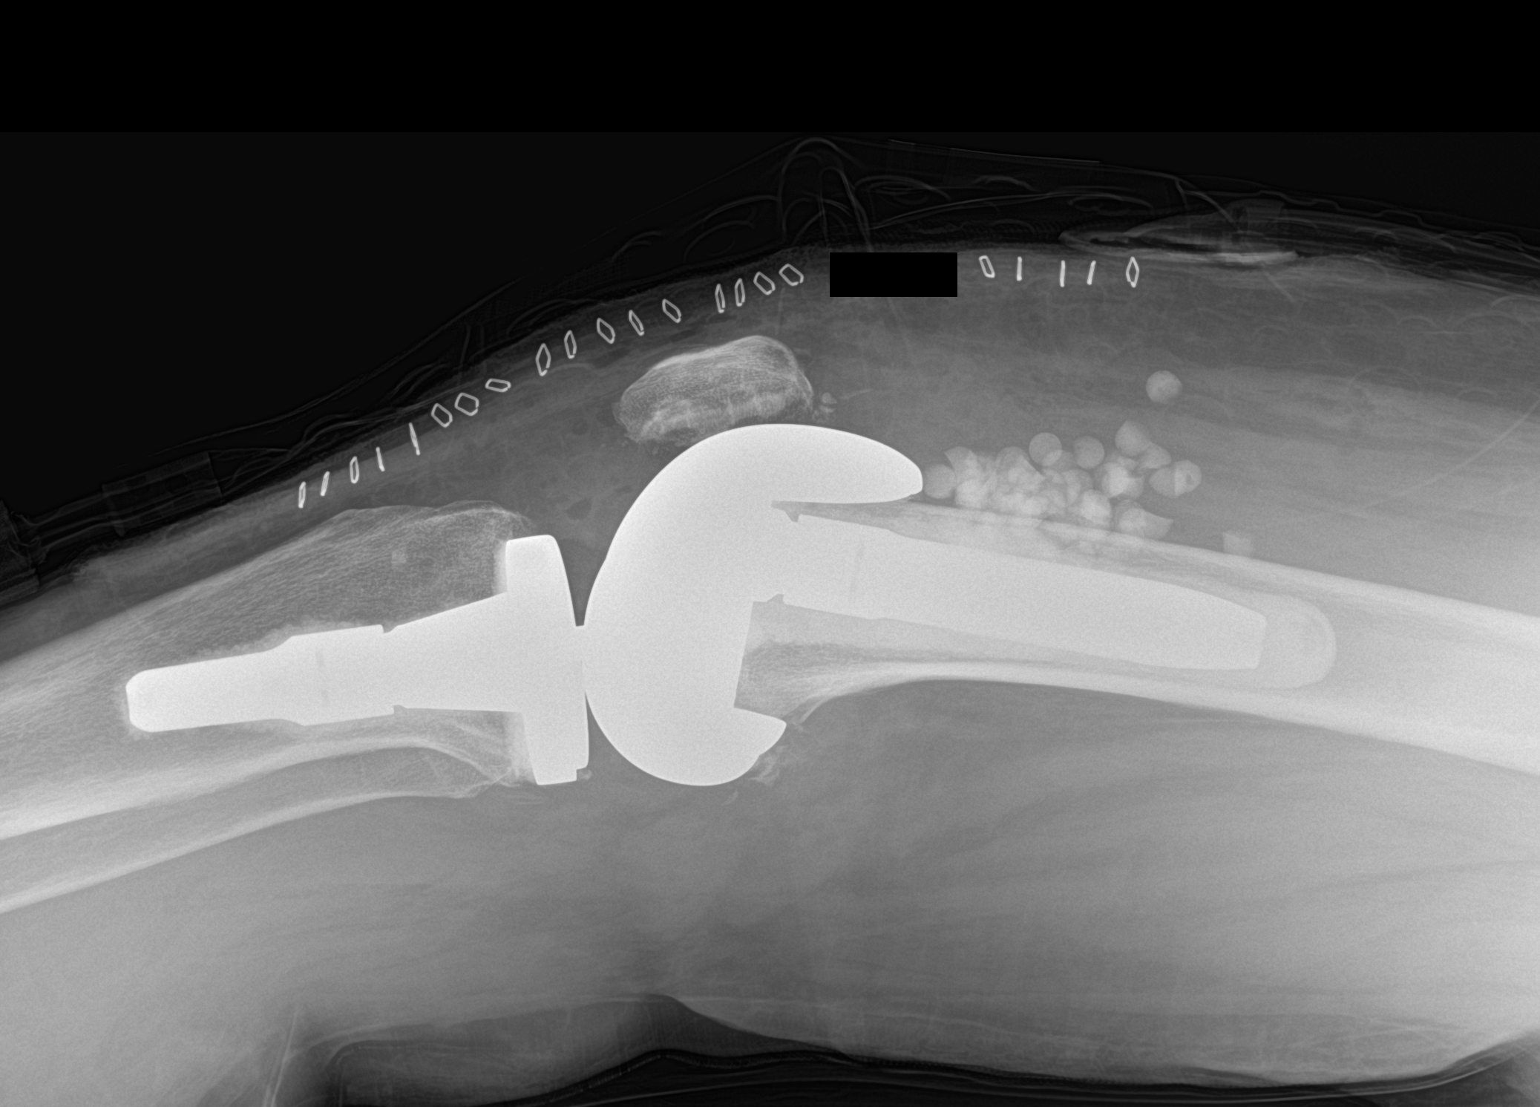

[2 of 2 positions shown; findings below may reference images not displayed]

FINDINGS: Left knee demonstrates a total knee arthroplasty without evidence of
hardware failure complication. No significant joint effusion. No
fracture or dislocation. Alignment is anatomic. Antibiotic beads are
noted along the anterior distal femoral diametaphysis. Post-surgical
changes noted in the surrounding soft tissues.
IMPRESSION: Left total knee arthroplasty.

## 2022-09-10 ENCOUNTER — Ambulatory Visit
Admission: RE | Admit: 2022-09-10 | Discharge: 2022-09-10 | Disposition: A | Discharge status: Home / Self Care | Payer: Medicare Other | Source: Ambulatory Visit | Attending: Internal Medicine | Admitting: Internal Medicine

## 2022-09-10 ENCOUNTER — Other Ambulatory Visit: Payer: Self-pay | Admitting: Internal Medicine

## 2022-09-10 DIAGNOSIS — R921 Mammographic calcification found on diagnostic imaging of breast: Secondary | ICD-10-CM

## 2022-09-10 DIAGNOSIS — R928 Other abnormal and inconclusive findings on diagnostic imaging of breast: Secondary | ICD-10-CM

## 2022-09-12 ENCOUNTER — Other Ambulatory Visit: Payer: Self-pay | Admitting: Internal Medicine

## 2022-09-12 DIAGNOSIS — R928 Other abnormal and inconclusive findings on diagnostic imaging of breast: Secondary | ICD-10-CM

## 2022-09-12 DIAGNOSIS — R921 Mammographic calcification found on diagnostic imaging of breast: Secondary | ICD-10-CM

## 2022-09-18 ENCOUNTER — Ambulatory Visit
Admission: RE | Admit: 2022-09-18 | Discharge: 2022-09-18 | Disposition: A | Discharge status: Home / Self Care | Payer: Medicare Other | Source: Ambulatory Visit | Attending: Internal Medicine | Admitting: Internal Medicine

## 2022-09-18 DIAGNOSIS — R928 Other abnormal and inconclusive findings on diagnostic imaging of breast: Secondary | ICD-10-CM | POA: Diagnosis present

## 2022-09-18 DIAGNOSIS — R921 Mammographic calcification found on diagnostic imaging of breast: Secondary | ICD-10-CM | POA: Diagnosis present

## 2022-09-18 HISTORY — PX: BREAST BIOPSY: SHX20

## 2022-09-18 MED ORDER — LIDOCAINE HCL (PF) 2 % IJ SOLN
5.0000 mL | Freq: Once | INTRAMUSCULAR | Status: DC
  Filled 2022-09-18: qty 5

## 2022-09-18 MED ORDER — LIDOCAINE-EPINEPHRINE 1 %-1:100000 IJ SOLN
20.0000 mL | Freq: Once | INTRAMUSCULAR | Status: DC
  Filled 2022-09-18: qty 20

## 2022-09-19 LAB — SURGICAL PATHOLOGY

## 2022-11-06 ENCOUNTER — Emergency Department
Admission: EM | Admit: 2022-11-06 | Discharge: 2022-11-06 | Disposition: A | Discharge status: Home / Self Care | Payer: Medicare Other | Attending: Emergency Medicine | Admitting: Emergency Medicine

## 2022-11-06 ENCOUNTER — Encounter: Payer: Self-pay | Admitting: Emergency Medicine

## 2022-11-06 DIAGNOSIS — L509 Urticaria, unspecified: Secondary | ICD-10-CM | POA: Diagnosis not present

## 2022-11-06 DIAGNOSIS — R21 Rash and other nonspecific skin eruption: Secondary | ICD-10-CM | POA: Diagnosis present

## 2022-11-06 MED ORDER — FAMOTIDINE 20 MG PO TABS
20.0000 mg | ORAL_TABLET | Freq: Once | ORAL | Status: AC
  Administered 2022-11-06: 20 mg via ORAL
  Filled 2022-11-06: qty 1

## 2022-11-06 MED ORDER — FAMOTIDINE 20 MG PO TABS: 20.0000 mg | ORAL_TABLET | Freq: Two times a day (BID) | ORAL | 0 refills | Status: DC

## 2022-11-06 MED ORDER — PREDNISONE 20 MG PO TABS
60.0000 mg | ORAL_TABLET | Freq: Once | ORAL | Status: AC
  Administered 2022-11-06: 60 mg via ORAL
  Filled 2022-11-06: qty 3

## 2022-11-06 MED ORDER — DIPHENHYDRAMINE HCL 25 MG PO TABS: ORAL_TABLET | ORAL | 0 refills | Status: DC

## 2022-11-06 MED ORDER — DIPHENHYDRAMINE HCL 25 MG PO CAPS
25.0000 mg | ORAL_CAPSULE | Freq: Once | ORAL | Status: AC
  Administered 2022-11-06: 25 mg via ORAL
  Filled 2022-11-06: qty 1

## 2022-11-06 MED ORDER — PREDNISONE 10 MG PO TABS: ORAL_TABLET | ORAL | 0 refills | Status: AC

## 2022-11-06 NOTE — Discharge Instructions (Signed)
STOP taking the Advil/Ibuprofen  START taking the prednisone course  Take Benadryl three times a day for 24 hours, then twice a day, then as needed.

## 2022-11-06 NOTE — ED Provider Notes (Signed)
[facility] Provider Note    Event Date/Time   First MD Initiated Contact with Patient 11/06/22 [phone]     (approximate)   History   Rash   HPI  Annette [name] is a 67 y.o. female here with diffuse rash.  The patient states that for the last 12 days or so, she has had a rash.  It started as a small linear area on her left ankle and lower abdomen.  She was seen by dermatologist and told that it was likely poison ivy.  She was putting topical steroids on this.  She also states that she began taking Advil at around the same time because she has been having arthritis type pain.  Over the last several days, the rash is worsened and now spread to her face, arms, and diffusely.  It is itchy.  She has had some facial swelling.  No history of similar rash.  No other new exposures other than taking Advil.     Physical Exam   Triage Vital Signs: ED Triage Vitals  Enc Vitals Group     BP 11/06/22 0644 (!) 168/84     Pulse Rate 11/06/22 0644 81     Resp 11/06/22 0644 18     Temp 11/06/22 0644 98 F (36.7 C)     Temp Source 11/06/22 0644 Oral     SpO2 11/06/22 0644 97 %     Weight 11/06/22 0641 170 lb 6.7 oz (77.3 kg)     Height 11/06/22 0641 5' (1.524 m)     Head Circumference --      Peak Flow --      Pain Score 11/06/22 0641 4     Pain Loc --      Pain Edu? --      Excl. in [place]? --     Most recent vital signs: Vitals:   11/06/22 0644  BP: (!) 168/84  Pulse: 81  Resp: 18  Temp: 98 F (36.7 C)  SpO2: 97%     General: Awake, no distress.  CV:  Good peripheral perfusion.  Regular rate and rhythm. Resp:  Normal work of breathing.  Lungs clear to auscultation bilaterally.  No wheezes. Abd:  No distention.  No tenderness.  Superficial bruising noted along area of rash to the lower abdomen, old appearing. Other:  Mild urticaria and erythematous rash across trunk and upper extremities.  There is urticaria and swelling of the face, right greater than left,  with mild erythema and urticarial swelling of the ears.  No lip or tongue swelling.  Airway is patent.   ED Results / Procedures / Treatments   Labs (all labs ordered are listed, but only abnormal results are displayed) Labs Reviewed - No data to display   EKG    RADIOLOGY    I also independently reviewed and agree with radiologist interpretations.   PROCEDURES:  Critical Care performed: No   MEDICATIONS ORDERED IN ED: Medications  predniSONE (DELTASONE) tablet 60 mg (has no administration in time range)  diphenhydrAMINE (BENADRYL) capsule 25 mg (has no administration in time range)  famotidine (PEPCID) tablet 20 mg (has no administration in time range)     IMPRESSION / MDM / ASSESSMENT AND PLAN / ED COURSE  I reviewed the triage vital signs and the nursing notes.  Differential diagnosis includes, but is not limited to, allergic reaction, drug eruption, autoimmune rash, viral exanthem  Patient's presentation is most consistent with acute presentation with potential threat to life or bodily function.  67 year old female here with diffuse rash.  While the rash of the lower leg could be consistent with poison ivy, rash today seems more consistent with diffuse drug eruption or allergic reaction.  Of note, this started after she had stopped prednisone for her arthritis and after she had started taking Advil for the first time.  Suspect this could be reaction to the Advil.  Differential includes systemic allergic reaction from unknown source.  Patient has no evidence of airway compromise.  She has no evidence to suggest more systemic illness vital signs are stable.  Will have her stop the Advil, start a long taper of steroids, and advise antihistamines.  Return precautions given.    FINAL CLINICAL IMPRESSION(S) / ED DIAGNOSES   Final diagnoses:  Rash  Urticaria     Rx / DC Orders   ED Discharge Orders          Ordered    predniSONE  (DELTASONE) 10 MG tablet  Q breakfast        11/06/22 0739    diphenhydrAMINE (BENADRYL) 25 MG tablet        11/06/22 0739    famotidine (PEPCID) 20 MG tablet  2 times daily        11/06/22 [id number]             Note:  This document was prepared using Dragon voice recognition software and may include unintentional dictation errors.   Duffy Bruce, MD 11/06/22 [phone]

## 2022-11-06 NOTE — ED Triage Notes (Signed)
Pt presents ambulatory to triage via POV with complaints of a rash that started several days ago. She notes being treated for poison oak/ivy by a Dermatologist but the prescribed medication hasn't been very effective. Erythema and edema to her face and torso; respirations equal and unlabored. A&Ox4 at this time. Denies CP or SOB.

## 2023-03-28 NOTE — Discharge Instructions (Addendum)
Instructions after Total Knee Replacement   James P. Angie Fava., M.D.    Dept. of Orthopaedics & Sports Medicine [facility] [address] [place], [place]  [address]  Phone: [phone]   Fax: [phone]       [email or web address]       DIET: Drink plenty of non-alcoholic fluids. Resume your normal diet. Include foods high in fiber.  ACTIVITY:  You may use crutches or a walker with weight-bearing as tolerated, unless instructed otherwise. You may be weaned off of the walker or crutches by your Physical Therapist.  Do NOT place pillows under the knee. Anything placed under the knee could limit your ability to straighten the knee.   Continue doing gentle exercises. Exercising will reduce the pain and swelling, increase motion, and prevent muscle weakness.   Please continue to use the TED compression stockings for 6 weeks. You may remove the stockings at night, but should reapply them in the morning. Do not drive or operate any equipment until instructed.  WOUND CARE:  Continue to use the PolarCare or ice packs periodically to reduce pain and swelling. You may bathe or shower after the staples are removed at the first office visit following surgery. The Aquacel bandage remains in place for 7 days postoperatively.  This can be changed out to a honeycomb bandage after this time.  At home physical therapy can help with this.  MEDICATIONS: You may resume your regular medications. Please take the pain medication as prescribed on the medication. Do not take pain medication on an empty stomach. You have been given a prescription for a blood thinner -please take a baby aspirin, 81 mg once in the morning and once at night.  Wear these in conjunction with your TED hose stockings to help prevent DVTs Do not drive or drink alcoholic beverages when taking pain medications.  CALL THE OFFICE FOR: Temperature above 101 degrees Excessive bleeding or drainage on the  dressing. Excessive swelling, coldness, or paleness of the toes. Persistent nausea and vomiting.  FOLLOW-UP:  You should have an appointment to return to the office in 10-14 days after surgery. Arrangements have been made for continuation of Physical Therapy (either home therapy or outpatient therapy).     [facility] Department Directory         [email or web address]       [email or web address]          Cardiology  Appointments: [place] - [phone]  Endocrinology  Appointments: [place] [phone] [place] - [phone]  Gastroenterology  Appointments: [place] [phone] [place] - [phone]        General Surgery   Appointments: [facility]  Internal Medicine/Family Medicine  Appointments: [facility] [place] - [phone] [place] - [phone]  Metabolic and Weigh Loss Surgery  Appointments: [facility]        Neurology  Appointments: [place] [phone] [place] - [phone]  Neurosurgery  Appointments: [place]  Obstetrics & Gynecology  Appointments: [place] [phone] [place] - [phone]        Pediatrics  Appointments: [place] [phone] [place] - [phone]  Physiatry  Appointments: [place] [phone]  Physical Therapy  Appointments: [place] - [phone]        Podiatry  Appointments: [place] [phone] [place] - [phone]  Pulmonology  Appointments: [place]  Rheumatology  Appointments: [place] [phone]        [place] Location: [facility]  [address] [place], [place]  [address]  [place] Location: [facility]. [address] [place], [place]  [address]  [place] Location: [facility] [address] [place], [place]  [address]

## 2023-03-31 ENCOUNTER — Encounter
Admission: RE | Admit: 2023-03-31 | Discharge: 2023-03-31 | Disposition: A | Discharge status: Home / Self Care | Payer: Medicare Other | Source: Ambulatory Visit | Attending: Orthopedic Surgery | Admitting: Orthopedic Surgery

## 2023-03-31 DIAGNOSIS — Z01818 Encounter for other preprocedural examination: Secondary | ICD-10-CM | POA: Diagnosis present

## 2023-03-31 DIAGNOSIS — E890 Postprocedural hypothyroidism: Secondary | ICD-10-CM | POA: Diagnosis not present

## 2023-03-31 DIAGNOSIS — L409 Psoriasis, unspecified: Secondary | ICD-10-CM | POA: Insufficient documentation

## 2023-03-31 DIAGNOSIS — Z01812 Encounter for preprocedural laboratory examination: Secondary | ICD-10-CM | POA: Diagnosis not present

## 2023-03-31 DIAGNOSIS — I6523 Occlusion and stenosis of bilateral carotid arteries: Secondary | ICD-10-CM | POA: Insufficient documentation

## 2023-03-31 DIAGNOSIS — R7303 Prediabetes: Secondary | ICD-10-CM | POA: Insufficient documentation

## 2023-03-31 DIAGNOSIS — Z880 Allergy status to penicillin: Secondary | ICD-10-CM | POA: Insufficient documentation

## 2023-03-31 DIAGNOSIS — K621 Rectal polyp: Secondary | ICD-10-CM | POA: Insufficient documentation

## 2023-03-31 DIAGNOSIS — Z01810 Encounter for preprocedural cardiovascular examination: Secondary | ICD-10-CM | POA: Diagnosis not present

## 2023-03-31 DIAGNOSIS — Z86010 Personal history of colonic polyps: Secondary | ICD-10-CM | POA: Diagnosis not present

## 2023-03-31 DIAGNOSIS — K635 Polyp of colon: Secondary | ICD-10-CM | POA: Diagnosis not present

## 2023-03-31 DIAGNOSIS — M1711 Unilateral primary osteoarthritis, right knee: Secondary | ICD-10-CM | POA: Insufficient documentation

## 2023-03-31 HISTORY — DX: Unspecified cataract: H26.9

## 2023-03-31 HISTORY — DX: Prediabetes: R73.03

## 2023-03-31 LAB — COMPREHENSIVE METABOLIC PANEL
ALT: 28 U/L (ref 0–44)
AST: 24 U/L (ref 15–41)
Albumin: 3.8 g/dL (ref 3.5–5.0)
Alkaline Phosphatase: 83 U/L (ref 38–126)
Anion gap: 7 (ref 5–15)
BUN: 22 mg/dL (ref 8–23)
CO2: 25 mmol/L (ref 22–32)
Calcium: 9.2 mg/dL (ref 8.9–10.3)
Chloride: 107 mmol/L (ref 98–111)
Creatinine, Ser: 0.74 mg/dL (ref 0.44–1.00)
GFR, Estimated: >60 mL/min (ref >60)
Glucose, Bld: 112 mg/dL — ABNORMAL HIGH (ref 70–99)
Potassium: 4 mmol/L (ref 3.5–5.1)
Sodium: 139 mmol/L (ref 135–145)
Total Bilirubin: 0.6 mg/dL (ref 0.3–1.2)
Total Protein: 7 g/dL (ref 6.5–8.1)

## 2023-03-31 LAB — URINALYSIS, ROUTINE W REFLEX MICROSCOPIC
Bilirubin Urine: NEGATIVE
Glucose, UA: NEGATIVE mg/dL
Hgb urine dipstick: NEGATIVE
Ketones, ur: NEGATIVE mg/dL
Leukocytes,Ua: NEGATIVE
Nitrite: NEGATIVE
Protein, ur: NEGATIVE mg/dL
Specific Gravity, Urine: 1.014 (ref 1.005–1.030)
pH: 5 (ref 5.0–8.0)

## 2023-03-31 LAB — SEDIMENTATION RATE: Sed Rate: 24 mm/hr (ref 0–30)

## 2023-03-31 LAB — TYPE AND SCREEN
ABO/RH(D): A POS
Antibody Screen: NEGATIVE

## 2023-03-31 LAB — HEMOGLOBIN AND HEMATOCRIT, BLOOD
HCT: 40.3 % (ref 36.0–46.0)
Hemoglobin: 13.6 g/dL (ref 12.0–15.0)

## 2023-03-31 LAB — SURGICAL PCR SCREEN
MRSA, PCR: NEGATIVE
Staphylococcus aureus: NEGATIVE

## 2023-03-31 LAB — C-REACTIVE PROTEIN: CRP: 1.3 mg/dL — ABNORMAL HIGH (ref <1.0)

## 2023-03-31 NOTE — Patient Instructions (Addendum)
Your procedure is scheduled on: Monday, August 12 Report to the Registration Desk on the 1st floor of the [facility] To find out your arrival time, please call [phone] between 1PM - 3PM on: Friday, August 9 If your arrival time is 6:00 am, do not arrive before that time as the [facility] entrance doors do not open until 6:00 am.  REMEMBER: Instructions that are not followed completely may result in serious medical risk, up to and including death; or upon the discretion of your surgeon and anesthesiologist your surgery may need to be rescheduled.  Do not eat food after midnight the night before surgery.  No gum chewing or hard candies.  You may however, drink CLEAR liquids up to 2 hours before you are scheduled to arrive for your surgery. Do not drink anything within 2 hours of your scheduled arrival time.  Clear liquids include: - water  - apple juice without pulp - gatorade (not RED colors) - black coffee or tea (Do NOT add milk or creamers to the coffee or tea) Do NOT drink anything that is not on this list.  In addition, your doctor has ordered for you to drink the provided:  Ensure Pre-Surgery Clear Carbohydrate Drink  Drinking this carbohydrate drink up to two hours before surgery helps to reduce insulin resistance and improve patient outcomes. Please complete drinking 2 hours before scheduled arrival time.  One week prior to surgery: starting August 5 Stop aspirin and Anti-inflammatories (NSAIDS) such as Advil, Aleve, Ibuprofen, Motrin, Naproxen, Naprosyn and Aspirin based products such as Excedrin, Goody's Powder, BC Powder. Stop ANY OVER THE COUNTER supplements until after surgery. Stop Excedrin, calcium, vitamin D You may however, continue to take Tylenol if needed for pain up until the day of surgery.  Continue taking all prescribed medications   TAKE ONLY THESE MEDICATIONS THE MORNING OF SURGERY WITH A SIP OF WATER:  Citalopram (Celexa) Levothyroxine  No  Alcohol for 24 hours before or after surgery.  No Smoking including e-cigarettes for 24 hours before surgery.  No chewable tobacco products for at least 6 hours before surgery.  No nicotine patches on the day of surgery.  Do not use any "recreational" drugs for at least a week (preferably 2 weeks) before your surgery.  Please be advised that the combination of cocaine and anesthesia may have negative outcomes, up to and including death. If you test positive for cocaine, your surgery will be cancelled.  On the morning of surgery brush your teeth with toothpaste and water, you may rinse your mouth with mouthwash if you wish. Do not swallow any toothpaste or mouthwash.  Use CHG Soap as directed on instruction sheet.  Do not wear jewelry, make-up, hairpins, clips or nail polish.  Do not wear lotions, powders, or perfumes.   Do not shave body hair from the neck down 48 hours before surgery.  Contact lenses, hearing aids and dentures may not be worn into surgery.  Do not bring valuables to the hospital. [facility] is not responsible for any missing/lost belongings or valuables.   Notify your doctor if there is any change in your medical condition (cold, fever, infection).  Wear comfortable clothing (specific to your surgery type) to the hospital.  After surgery, you can help prevent lung complications by doing breathing exercises.  Take deep breaths and cough every 1-2 hours. Your doctor may order a device called an Incentive Spirometer to help you take deep breaths.  If you are being admitted to the  hospital overnight, leave your suitcase in the car. After surgery it may be brought to your room.  In case of increased patient census, it may be necessary for you, the patient, to continue your postoperative care in the Same Day Surgery department.  If you are being discharged the day of surgery, you will not be allowed to drive home. You will need a responsible individual to drive you  home and stay with you for 24 hours after surgery.   If you are taking public transportation, you will need to have a responsible individual with you.  Please call the Pre-admissions Testing Dept. at [phone] if you have any questions about these instructions.  Surgery Visitation Policy:  Patients having surgery or a procedure may have two visitors.  Children under the age of 80 must have an adult with them who is not the patient.  Inpatient Visitation:    Visiting hours are 7 a.m. to 8 p.m. Up to four visitors are allowed at one time in a patient room. The visitors may rotate out with other people during the day.  One visitor age 68 or older may stay with the patient overnight and must be in the room by 8 p.m.    Pre-operative 5 CHG Bath Instructions   You can play a key role in reducing the risk of infection after surgery. Your skin needs to be as free of germs as possible. You can reduce the number of germs on your skin by washing with CHG (chlorhexidine gluconate) soap before surgery. CHG is an antiseptic soap that kills germs and continues to kill germs even after washing.   DO NOT use if you have an allergy to chlorhexidine/CHG or antibacterial soaps. If your skin becomes reddened or irritated, stop using the CHG and notify one of our RNs at [phone].   Please shower with the CHG soap starting 4 days before surgery using the following schedule:     Please keep in mind the following:  DO NOT shave, including legs and underarms, starting the day of your first shower.   You may shave your face at any point before/day of surgery.  Place clean sheets on your bed the day you start using CHG soap. Use a clean washcloth (not used since being washed) for each shower. DO NOT sleep with pets once you start using the CHG.   CHG Shower Instructions:  If you choose to wash your hair and private area, wash first with your normal shampoo/soap.  After you use shampoo/soap, rinse  your hair and body thoroughly to remove shampoo/soap residue.  Turn the water OFF and apply about 3 tablespoons (45 ml) of CHG soap to a CLEAN washcloth.  Apply CHG soap ONLY FROM YOUR NECK DOWN TO YOUR TOES (washing for 3-5 minutes)  DO NOT use CHG soap on face, private areas, open wounds, or sores.  Pay special attention to the area where your surgery is being performed.  If you are having back surgery, having someone wash your back for you may be helpful. Wait 2 minutes after CHG soap is applied, then you may rinse off the CHG soap.  Pat dry with a clean towel  Put on clean clothes/pajamas   If you choose to wear lotion, please use ONLY the CHG-compatible lotions on the back of this paper.     Additional instructions for the day of surgery: DO NOT APPLY any lotions, deodorants, cologne, or perfumes.   Put on clean/comfortable clothes.  Brush your  teeth.  Ask your nurse before applying any prescription medications to the skin.      CHG Compatible Lotions   Aveeno Moisturizing lotion  Cetaphil Moisturizing Cream  Cetaphil Moisturizing Lotion  Clairol Herbal Essence Moisturizing Lotion, Dry Skin  Clairol Herbal Essence Moisturizing Lotion, Extra Dry Skin  Clairol Herbal Essence Moisturizing Lotion, Normal Skin  Curel Age Defying Therapeutic Moisturizing Lotion with Alpha Hydroxy  Curel Extreme Care Body Lotion  Curel Soothing Hands Moisturizing Hand Lotion  Curel Therapeutic Moisturizing Cream, Fragrance-Free  Curel Therapeutic Moisturizing Lotion, Fragrance-Free  Curel Therapeutic Moisturizing Lotion, Original Formula  Eucerin Daily Replenishing Lotion  Eucerin Dry Skin Therapy Plus Alpha Hydroxy Crme  Eucerin Dry Skin Therapy Plus Alpha Hydroxy Lotion  Eucerin Original Crme  Eucerin Original Lotion  Eucerin Plus Crme Eucerin Plus Lotion  Eucerin TriLipid Replenishing Lotion  Keri Anti-Bacterial Hand Lotion  Keri Deep Conditioning Original Lotion Dry Skin Formula Softly  Scented  Keri Deep Conditioning Original Lotion, Fragrance Free Sensitive Skin Formula  Keri Lotion Fast Absorbing Fragrance Free Sensitive Skin Formula  Keri Lotion Fast Absorbing Softly Scented Dry Skin Formula  Keri Original Lotion  Keri Skin Renewal Lotion Keri Silky Smooth Lotion  Keri Silky Smooth Sensitive Skin Lotion  Nivea Body Creamy Conditioning Oil  Nivea Body Extra Enriched Lotion  Nivea Body Original Lotion  Nivea Body Sheer Moisturizing Lotion Nivea Crme  Nivea Skin Firming Lotion  NutraDerm 30 Skin Lotion  NutraDerm Skin Lotion  NutraDerm Therapeutic Skin Cream  NutraDerm Therapeutic Skin Lotion  ProShield Protective Hand Cream  Provon moisturizing lotion  Preoperative Educational Videos for Total Hip, Knee and Shoulder Replacements  To better prepare for surgery, please view our videos that explain the physical activity and discharge planning required to have the best surgical recovery at [facility].  [email or web address]  Questions? Call [phone] or email [email or web address]

## 2023-04-03 NOTE — H&P (Signed)
ORTHOPAEDIC HISTORY & PHYSICAL  Latanya Maudlin, PA - 03/30/2023 3:45 PM EDT Formatting of this note is different from the original. Images from the original note were not included. Chief Complaint Chief Complaint Patient presents with Knee Pain H & P RIGHT KNEE  Reason for Visit Annette [name] is a 67 y.o. who presents today for a history and physical. She is to undergo a right total knee arthroplasty on 04/06/2023. There is been no change in her condition since her last visit. The patient expresses her desire to proceed with surgery.  She reports a long history of right knee pain. She underwent right knee arthroscopy in September 2015 as per Dr. Erin Sons and has had intermittent discomfort since that time. She localizes most of the pain along the medial aspect of the knee. She reports some swelling, no locking, and some giving way of the knee. The pain is aggravated by any weight bearing. The knee pain limits the patient's ability to ambulate long distances. The patient has not appreciated any significant improvement despite activity modification and intra-articular corticosteroid injections. She has an intolerance to NSAIDs.. She is not using any ambulatory aids. The patient states that the knee pain has progressed to the point that it is significantly interfering with her activities of daily living.  Of note, the patient is somewhat apprehensive about surgical intervention. She underwent left total knee arthroplasty in April 2022 as per Dr. Rosita Kea. Due to issues with arthrofibrosis, she subsequently underwent manipulation of the left knee under anesthesia (01/01/2021), left knee arthroscopy and extensive synovectomy (01/15/2021), and left total knee revision arthroplasty (03/12/2021). Despite these interventions, she still has significant restriction with her left knee range of motion.  Past Medical History Past Medical History: Diagnosis Date Anxiety Anxiety and  depression Arthritis Elevated BP History of cataract Hyperlipidemia Hyperthyroidism Evaluated by Dr. Uvaldo Bristle at [facility] End[facility] 2009; s/p I131 ablation 9/10. 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 Migraine Migraines 01/04/2014 Osteoarthritis Psoriasis  Past Surgical History Past Surgical History: Procedure Laterality Date HYSTERECTOMY 03/2007 fibroids OTHER SURGERY N/A 2010 THYROID ABLATION Arthroscopic partial medial meniscectomy Right 05/12/2014 KNEE ARTHROSCOPY Left 09/14/2015 ARTHROSCOPY KNEE Left 09/14/2015 CAROTID ENDARTERECTOMY Right 06/24/2017 CATARACT EXTRACTION Left 06/22/2018 Left total knee arthroplasty 11/29/2020 Dr Rosita Kea CLOSED MANIPULATION KNEE (Left) 01/01/2021 Dr Rosita Kea Left knee arthroscopy for arthrofibrosis, extensive synovectomy 01/15/2021 Dr Rosita Kea REVISION TOTAL KNEE ARTHROPLASTY Left 03/12/2021 Menz-femoral component CESAREAN SECTION x 2 TUBAL LIGATION  Past Family History Family History Problem Relation Age of Onset Arthritis Mother Gout Mother Osteoporosis (Thinning of bones) Mother Coronary Artery Disease (Blocked arteries around heart) Mother Hyperlipidemia (Elevated cholesterol) Mother Hip fracture Mother High blood pressure (Hypertension) Mother Myocardial Infarction (Heart attack) Father Colon polyps Father Diabetes type II Sister Breast cancer Sister Coronary Artery Disease (Blocked arteries around heart) Brother Coronary Artery Disease (Blocked arteries around heart) Brother  Medications Current Outpatient Medications Medication Sig Dispense Refill acetaminophen (TYLENOL) 500 MG tablet Take 1,000 mg by mouth once daily as needed for Pain ALPRAZolam (XANAX) 0.25 MG tablet TAKE 1 TABLET (0.25 MG TOTAL) BY MOUTH ONCE DAILY AS NEEDED FOR SLEEP 45 tablet 1 aspirin 81 MG EC tablet Take 81 mg by mouth once daily aspirin-acetaminophen-caffeine (EXCEDRIN EXTRA STRENGTH) 250-250-65 mg per tablet Take 2 tablets by mouth once  daily atorvastatin (LIPITOR) 80 MG tablet TAKE 1 TABLET (80 MG TOTAL) BY MOUTH ONCE DAILY. 91 tablet 3 calcium carbonate/vitamin D3 (CALCIUM 600 + D,3, ORAL) Take 1 tablet by mouth once daily cholecalciferol (VITAMIN D3) 2,000 unit tablet  Take 2,000 Units by mouth once daily citalopram (CELEXA) 20 MG tablet TAKE 1 TABLET BY MOUTH DAILY 90 tablet 1 clobetasoL (CORMAX) 0.05 % external solution Apply topically 2 (two) times daily levothyroxine (SYNTHROID) 50 MCG tablet TAKE 1 TABLET BY MOUTH EVERY DAY. WAIT 30 MINUTES BBEFORE TAKING OTHER MEDICATIONS 90 tablet 1 mupirocin (BACTROBAN) 2 % ointment Apply topically 3 (three) times daily for 7 days 15 g 1 propylene glycoL (SYSTANE BALANCE) 0.6 % ophthalmic drops Place 1 drop into both eyes 2 (two) times daily as needed for Dry Eyes  No current facility-administered medications for this visit.  Allergies Allergies Allergen Reactions Celebrex [Celecoxib] Headache Aura migraines Ibuprofen Itching, Swelling and Rash No problems breathing Tramadol Headache Clindamycin Nausea Doxycycline Headache Penicillins Rash   Review of Systems A comprehensive 14 point ROS was performed, reviewed, and the pertinent orthopaedic findings are documented in the HPI.  Exam BP 130/80 (BP Location: Left upper arm, Patient Position: Sitting, BP Cuff Size: Adult)  Ht 152.4 cm (5')  Wt 79.7 kg (175 lb 12.8 oz)  LMP (LMP Unknown) Comment: Hysterectomy  BMI 34.33 kg/m  General: Well-developed well-nourished female seen in no acute distress.  HEENT: Atraumatic,normocephalic. Pupils are equal and reactive to light. Oropharynx is clear with moist mucosa  Lungs: Clear to auscultation bilaterally  Cardiovascular: Regular rate and rhythm. Normal S1, S2. No murmurs. No appreciable gallops or rubs. Peripheral pulses are palpable.  Abdomen: Soft, non-tender, nondistended. Bowel sounds present  Extremity: Right Knee: Soft tissue swelling: minimal Effusion:  none Erythema: none Crepitance: mild Tenderness: medial Alignment: relative varus Mediolateral laxity: medial pseudolaxity Posterior sag: negative Patellar tracking: Good tracking without evidence of subluxation or tilt Atrophy: No significant atrophy. Quadriceps tone was fair to good. Range of motion: 0/0/95 degrees  Neurological:  The patient is alert and oriented Sensation to light touch appears to be intact and within normal limits Gross motor strength appeared to be equal to 5/5  Vascular :  Peripheral pulses felt to be palpable. Capillary refill appears to be intact and within normal limits  X-ray  1. AP standing, lateral and sunrise view of the right knee ordered and interpreted on today's visit shows bone-on-bone to the medial compartment space. There is some mild subchondral sclerosis. Is noted to have osteophytes in a tricompartmental fashion. She is noted to have very mild varus deformity.  Impression  1. Degenerative arthrosis right knee  Plan  1. I have gone over the patient's medication on today's visit 2. Past medical history was reviewed 3. Postop rehab course was discussed 4. Return to clinic postop as scheduled. Sooner if any problems  This note was generated in part with voice recognition software and I apologize for any typographical errors that were not detected and corrected   Tera Partridge PA Electronically signed by Latanya Maudlin, PA at 03/31/2023 10:49 AM EDT

## 2023-04-06 ENCOUNTER — Encounter: Payer: Self-pay | Admitting: Orthopedic Surgery

## 2023-04-06 ENCOUNTER — Other Ambulatory Visit: Payer: Self-pay

## 2023-04-06 ENCOUNTER — Ambulatory Visit: Payer: Medicare Other | Admitting: Urgent Care

## 2023-04-06 ENCOUNTER — Observation Stay
Admission: RE | Admit: 2023-04-06 | Discharge: 2023-04-07 | Disposition: A | Discharge status: Home / Self Care | Payer: Medicare Other | Attending: Orthopedic Surgery | Admitting: Orthopedic Surgery

## 2023-04-06 ENCOUNTER — Encounter
Admission: RE | Disposition: A | Discharge status: Home / Self Care | Payer: Self-pay | Source: Home / Self Care | Attending: Orthopedic Surgery

## 2023-04-06 ENCOUNTER — Ambulatory Visit: Payer: Medicare Other

## 2023-04-06 DIAGNOSIS — Z79899 Other long term (current) drug therapy: Secondary | ICD-10-CM | POA: Diagnosis not present

## 2023-04-06 DIAGNOSIS — K621 Rectal polyp: Secondary | ICD-10-CM

## 2023-04-06 DIAGNOSIS — L409 Psoriasis, unspecified: Secondary | ICD-10-CM

## 2023-04-06 DIAGNOSIS — E039 Hypothyroidism, unspecified: Secondary | ICD-10-CM | POA: Diagnosis not present

## 2023-04-06 DIAGNOSIS — I10 Essential (primary) hypertension: Secondary | ICD-10-CM | POA: Diagnosis not present

## 2023-04-06 DIAGNOSIS — M1711 Unilateral primary osteoarthritis, right knee: Secondary | ICD-10-CM | POA: Diagnosis present

## 2023-04-06 DIAGNOSIS — R7303 Prediabetes: Secondary | ICD-10-CM

## 2023-04-06 DIAGNOSIS — Z96652 Presence of left artificial knee joint: Secondary | ICD-10-CM | POA: Insufficient documentation

## 2023-04-06 DIAGNOSIS — Z86010 Personal history of colonic polyps: Secondary | ICD-10-CM

## 2023-04-06 DIAGNOSIS — Z01812 Encounter for preprocedural laboratory examination: Secondary | ICD-10-CM

## 2023-04-06 DIAGNOSIS — Z7982 Long term (current) use of aspirin: Secondary | ICD-10-CM | POA: Diagnosis not present

## 2023-04-06 DIAGNOSIS — Z880 Allergy status to penicillin: Secondary | ICD-10-CM

## 2023-04-06 DIAGNOSIS — K635 Polyp of colon: Secondary | ICD-10-CM

## 2023-04-06 DIAGNOSIS — Z96659 Presence of unspecified artificial knee joint: Secondary | ICD-10-CM

## 2023-04-06 DIAGNOSIS — E890 Postprocedural hypothyroidism: Secondary | ICD-10-CM

## 2023-04-06 DIAGNOSIS — I6523 Occlusion and stenosis of bilateral carotid arteries: Secondary | ICD-10-CM

## 2023-04-06 HISTORY — PX: KNEE ARTHROPLASTY: SHX992

## 2023-04-06 LAB — GLUCOSE, CAPILLARY: Glucose-Capillary: 139 mg/dL — ABNORMAL HIGH (ref 70–99)

## 2023-04-06 SURGERY — ARTHROPLASTY, KNEE, TOTAL, USING IMAGELESS COMPUTER-ASSISTED NAVIGATION
Anesthesia: Spinal | Site: Knee | Laterality: Right

## 2023-04-06 MED ORDER — MAGNESIUM HYDROXIDE 400 MG/5ML PO SUSP
30.0000 mL | Freq: Every day | ORAL | Status: DC
  Administered 2023-04-06 – 2023-04-07 (×2): 30 mL via ORAL

## 2023-04-06 MED ORDER — ASPIRIN 81 MG PO CHEW
81.0000 mg | CHEWABLE_TABLET | Freq: Two times a day (BID) | ORAL | Status: DC
  Administered 2023-04-06 – 2023-04-07 (×2): 81 mg via ORAL

## 2023-04-06 MED ORDER — ATORVASTATIN CALCIUM 20 MG PO TABS
80.0000 mg | ORAL_TABLET | Freq: Every day | ORAL | Status: DC
  Administered 2023-04-06: 80 mg via ORAL

## 2023-04-06 MED ORDER — ACETAMINOPHEN 10 MG/ML IV SOLN
INTRAVENOUS | Status: AC
  Filled 2023-04-06: qty 100

## 2023-04-06 MED ORDER — ONDANSETRON HCL 4 MG PO TABS: 4.0000 mg | ORAL_TABLET | Freq: Four times a day (QID) | ORAL | Status: DC | PRN

## 2023-04-06 MED ORDER — PHENYLEPHRINE HCL-NACL 20-0.9 MG/250ML-% IV SOLN
INTRAVENOUS | Status: DC | PRN
  Administered 2023-04-06: 25 ug/min via INTRAVENOUS

## 2023-04-06 MED ORDER — LIDOCAINE HCL (PF) 2 % IJ SOLN
INTRAMUSCULAR | Status: AC
  Filled 2023-04-06: qty 5

## 2023-04-06 MED ORDER — DEXAMETHASONE SODIUM PHOSPHATE 10 MG/ML IJ SOLN
INTRAMUSCULAR | Status: AC
  Filled 2023-04-06: qty 1

## 2023-04-06 MED ORDER — HYDROMORPHONE HCL 1 MG/ML IJ SOLN: 0.5000 mg | INTRAMUSCULAR | Status: DC | PRN

## 2023-04-06 MED ORDER — TRANEXAMIC ACID-NACL 1000-0.7 MG/100ML-% IV SOLN
INTRAVENOUS | Status: AC
  Filled 2023-04-06: qty 100

## 2023-04-06 MED ORDER — FAMOTIDINE 20 MG PO TABS
ORAL_TABLET | ORAL | Status: AC
  Filled 2023-04-06: qty 1

## 2023-04-06 MED ORDER — ORAL CARE MOUTH RINSE: 15.0000 mL | Freq: Once | OROMUCOSAL | Status: AC

## 2023-04-06 MED ORDER — EPHEDRINE 5 MG/ML INJ
INTRAVENOUS | Status: AC
  Filled 2023-04-06: qty 5

## 2023-04-06 MED ORDER — ONDANSETRON HCL 4 MG/2ML IJ SOLN
INTRAMUSCULAR | Status: AC
  Filled 2023-04-06: qty 2

## 2023-04-06 MED ORDER — SENNOSIDES-DOCUSATE SODIUM 8.6-50 MG PO TABS
ORAL_TABLET | ORAL | Status: AC
  Filled 2023-04-06: qty 1

## 2023-04-06 MED ORDER — MAGNESIUM HYDROXIDE 400 MG/5ML PO SUSP
ORAL | Status: AC
  Filled 2023-04-06: qty 30

## 2023-04-06 MED ORDER — ALUM & MAG HYDROXIDE-SIMETH 200-200-20 MG/5ML PO SUSP: 30.0000 mL | ORAL | Status: DC | PRN

## 2023-04-06 MED ORDER — DEXAMETHASONE SODIUM PHOSPHATE 10 MG/ML IJ SOLN
8.0000 mg | Freq: Once | INTRAMUSCULAR | Status: AC
  Administered 2023-04-06: 8 mg via INTRAVENOUS

## 2023-04-06 MED ORDER — ACETAMINOPHEN 325 MG PO TABS: 325.0000 mg | ORAL_TABLET | Freq: Four times a day (QID) | ORAL | Status: DC | PRN

## 2023-04-06 MED ORDER — MIDAZOLAM HCL 5 MG/5ML IJ SOLN
INTRAMUSCULAR | Status: DC | PRN
  Administered 2023-04-06: 2 mg via INTRAVENOUS

## 2023-04-06 MED ORDER — PHENYLEPHRINE HCL (PRESSORS) 10 MG/ML IV SOLN
INTRAVENOUS | Status: DC | PRN
  Administered 2023-04-06 (×2): 80 ug via INTRAVENOUS

## 2023-04-06 MED ORDER — TRANEXAMIC ACID-NACL 1000-0.7 MG/100ML-% IV SOLN
1000.0000 mg | Freq: Once | INTRAVENOUS | Status: AC
  Administered 2023-04-06: 1000 mg via INTRAVENOUS

## 2023-04-06 MED ORDER — TRANEXAMIC ACID-NACL 1000-0.7 MG/100ML-% IV SOLN
1000.0000 mg | INTRAVENOUS | Status: AC
  Administered 2023-04-06: 1000 mg via INTRAVENOUS

## 2023-04-06 MED ORDER — ORAL CARE MOUTH RINSE: 15.0000 mL | OROMUCOSAL | Status: DC | PRN

## 2023-04-06 MED ORDER — GABAPENTIN 300 MG PO CAPS
300.0000 mg | ORAL_CAPSULE | Freq: Once | ORAL | Status: AC
  Administered 2023-04-06: 300 mg via ORAL

## 2023-04-06 MED ORDER — SENNOSIDES-DOCUSATE SODIUM 8.6-50 MG PO TABS
1.0000 | ORAL_TABLET | Freq: Two times a day (BID) | ORAL | Status: DC
  Administered 2023-04-06 – 2023-04-07 (×2): 1 via ORAL

## 2023-04-06 MED ORDER — FENTANYL CITRATE (PF) 100 MCG/2ML IJ SOLN: 25.0000 ug | INTRAMUSCULAR | Status: DC | PRN

## 2023-04-06 MED ORDER — ALPRAZOLAM 0.5 MG PO TABS
ORAL_TABLET | ORAL | Status: AC
  Filled 2023-04-06: qty 1

## 2023-04-06 MED ORDER — CHLORHEXIDINE GLUCONATE 0.12 % MT SOLN
OROMUCOSAL | Status: AC
  Filled 2023-04-06: qty 15

## 2023-04-06 MED ORDER — OXYCODONE HCL 5 MG/5ML PO SOLN: 5.0000 mg | Freq: Once | ORAL | Status: DC | PRN

## 2023-04-06 MED ORDER — PANTOPRAZOLE SODIUM 40 MG PO TBEC
DELAYED_RELEASE_TABLET | ORAL | Status: AC
  Filled 2023-04-06: qty 1

## 2023-04-06 MED ORDER — FLEET ENEMA 7-19 GM/118ML RE ENEM: 1.0000 | ENEMA | Freq: Once | RECTAL | Status: DC | PRN

## 2023-04-06 MED ORDER — SODIUM CHLORIDE 0.9 % IV SOLN: INTRAVENOUS | Status: DC

## 2023-04-06 MED ORDER — ACETAMINOPHEN 10 MG/ML IV SOLN: 1000.0000 mg | Freq: Once | INTRAVENOUS | Status: DC | PRN

## 2023-04-06 MED ORDER — ONDANSETRON HCL 4 MG/2ML IJ SOLN
INTRAMUSCULAR | Status: DC | PRN
  Administered 2023-04-06: 4 mg via INTRAVENOUS

## 2023-04-06 MED ORDER — SURGIRINSE WOUND IRRIGATION SYSTEM - OPTIME
TOPICAL | Status: DC | PRN
  Administered 2023-04-06: 450 mL via TOPICAL

## 2023-04-06 MED ORDER — ONDANSETRON HCL 4 MG/2ML IJ SOLN
4.0000 mg | Freq: Four times a day (QID) | INTRAMUSCULAR | Status: DC | PRN
  Administered 2023-04-06: 4 mg via INTRAVENOUS

## 2023-04-06 MED ORDER — CEFAZOLIN SODIUM-DEXTROSE 2-4 GM/100ML-% IV SOLN
INTRAVENOUS | Status: AC
  Filled 2023-04-06: qty 100

## 2023-04-06 MED ORDER — BUPIVACAINE HCL (PF) 0.25 % IJ SOLN
INTRAMUSCULAR | Status: DC | PRN
  Administered 2023-04-06: 60 mL

## 2023-04-06 MED ORDER — OXYCODONE HCL 5 MG PO TABS
5.0000 mg | ORAL_TABLET | ORAL | Status: DC | PRN
  Administered 2023-04-06 – 2023-04-07 (×3): 5 mg via ORAL

## 2023-04-06 MED ORDER — METOCLOPRAMIDE HCL 10 MG PO TABS
ORAL_TABLET | ORAL | Status: AC
  Filled 2023-04-06: qty 1

## 2023-04-06 MED ORDER — BUPIVACAINE HCL (PF) 0.5 % IJ SOLN
INTRAMUSCULAR | Status: DC | PRN
  Administered 2023-04-06: 3 mL

## 2023-04-06 MED ORDER — CEFAZOLIN SODIUM-DEXTROSE 2-4 GM/100ML-% IV SOLN
2.0000 g | Freq: Four times a day (QID) | INTRAVENOUS | Status: AC
  Administered 2023-04-06 (×2): 2 g via INTRAVENOUS

## 2023-04-06 MED ORDER — LIDOCAINE HCL (CARDIAC) PF 100 MG/5ML IV SOSY
PREFILLED_SYRINGE | INTRAVENOUS | Status: DC | PRN
  Administered 2023-04-06: 20 mg via INTRAVENOUS

## 2023-04-06 MED ORDER — MENTHOL 3 MG MT LOZG: 1.0000 | LOZENGE | OROMUCOSAL | Status: DC | PRN

## 2023-04-06 MED ORDER — CITALOPRAM HYDROBROMIDE 10 MG PO TABS
10.0000 mg | ORAL_TABLET | ORAL | Status: DC
  Administered 2023-04-07: 10 mg via ORAL
  Filled 2023-04-06: qty 1

## 2023-04-06 MED ORDER — ASPIRIN 81 MG PO CHEW
CHEWABLE_TABLET | ORAL | Status: AC
  Filled 2023-04-06: qty 1

## 2023-04-06 MED ORDER — SODIUM CHLORIDE 0.9 % IV SOLN
INTRAVENOUS | Status: DC | PRN
  Administered 2023-04-06: 60 mL

## 2023-04-06 MED ORDER — FLUTICASONE PROPIONATE 50 MCG/ACT NA SUSP: 1.0000 | Freq: Every day | NASAL | Status: DC | PRN

## 2023-04-06 MED ORDER — PHENYLEPHRINE 80 MCG/ML (10ML) SYRINGE FOR IV PUSH (FOR BLOOD PRESSURE SUPPORT)
PREFILLED_SYRINGE | INTRAVENOUS | Status: AC
  Filled 2023-04-06: qty 10

## 2023-04-06 MED ORDER — LACTATED RINGERS IV SOLN: INTRAVENOUS | Status: DC

## 2023-04-06 MED ORDER — PHENOL 1.4 % MT LIQD: 1.0000 | OROMUCOSAL | Status: DC | PRN

## 2023-04-06 MED ORDER — ALPRAZOLAM 0.5 MG PO TABS
0.2500 mg | ORAL_TABLET | Freq: Every day | ORAL | Status: DC
  Administered 2023-04-06: 0.25 mg via ORAL

## 2023-04-06 MED ORDER — ACETAMINOPHEN 10 MG/ML IV SOLN
INTRAVENOUS | Status: DC | PRN
  Administered 2023-04-06: 1000 mg via INTRAVENOUS

## 2023-04-06 MED ORDER — PANTOPRAZOLE SODIUM 40 MG PO TBEC
40.0000 mg | DELAYED_RELEASE_TABLET | Freq: Two times a day (BID) | ORAL | Status: DC
  Administered 2023-04-06 – 2023-04-07 (×2): 40 mg via ORAL

## 2023-04-06 MED ORDER — ATORVASTATIN CALCIUM 20 MG PO TABS
ORAL_TABLET | ORAL | Status: AC
  Filled 2023-04-06: qty 4

## 2023-04-06 MED ORDER — ACETAMINOPHEN 10 MG/ML IV SOLN
1000.0000 mg | Freq: Four times a day (QID) | INTRAVENOUS | Status: DC
  Administered 2023-04-06 – 2023-04-07 (×2): 1000 mg via INTRAVENOUS

## 2023-04-06 MED ORDER — LEVOTHYROXINE SODIUM 50 MCG PO TABS
50.0000 ug | ORAL_TABLET | Freq: Every day | ORAL | Status: DC
  Administered 2023-04-07: 50 ug via ORAL
  Filled 2023-04-06: qty 1

## 2023-04-06 MED ORDER — DROPERIDOL 2.5 MG/ML IJ SOLN: 0.6250 mg | Freq: Once | INTRAMUSCULAR | Status: DC | PRN

## 2023-04-06 MED ORDER — DIPHENHYDRAMINE HCL 12.5 MG/5ML PO ELIX: 12.5000 mg | ORAL_SOLUTION | ORAL | Status: DC | PRN

## 2023-04-06 MED ORDER — CLOBETASOL PROPIONATE 0.05 % EX OINT: 1.0000 | TOPICAL_OINTMENT | Freq: Two times a day (BID) | CUTANEOUS | Status: DC | PRN

## 2023-04-06 MED ORDER — OXYCODONE HCL 5 MG PO TABS
10.0000 mg | ORAL_TABLET | ORAL | Status: DC | PRN
  Administered 2023-04-07: 10 mg via ORAL

## 2023-04-06 MED ORDER — POLYVINYL ALCOHOL 1.4 % OP SOLN: 1.0000 [drp] | OPHTHALMIC | Status: DC | PRN

## 2023-04-06 MED ORDER — FENTANYL CITRATE (PF) 100 MCG/2ML IJ SOLN
INTRAMUSCULAR | Status: DC | PRN
  Administered 2023-04-06: 50 ug via INTRAVENOUS

## 2023-04-06 MED ORDER — PROMETHAZINE HCL 25 MG/ML IJ SOLN: 6.2500 mg | INTRAMUSCULAR | Status: DC | PRN

## 2023-04-06 MED ORDER — OXYCODONE HCL 5 MG PO TABS
ORAL_TABLET | ORAL | Status: AC
  Filled 2023-04-06: qty 1

## 2023-04-06 MED ORDER — FAMOTIDINE 20 MG PO TABS
20.0000 mg | ORAL_TABLET | Freq: Once | ORAL | Status: AC
  Administered 2023-04-06: 20 mg via ORAL

## 2023-04-06 MED ORDER — CHLORHEXIDINE GLUCONATE 4 % EX SOLN
60.0000 mL | Freq: Once | CUTANEOUS | Status: AC
  Administered 2023-04-06: 4 via TOPICAL

## 2023-04-06 MED ORDER — FENTANYL CITRATE (PF) 100 MCG/2ML IJ SOLN
INTRAMUSCULAR | Status: AC
  Filled 2023-04-06: qty 2

## 2023-04-06 MED ORDER — METOCLOPRAMIDE HCL 10 MG PO TABS
10.0000 mg | ORAL_TABLET | Freq: Three times a day (TID) | ORAL | Status: DC
  Administered 2023-04-06 – 2023-04-07 (×3): 10 mg via ORAL

## 2023-04-06 MED ORDER — OXYCODONE HCL 5 MG PO TABS: 5.0000 mg | ORAL_TABLET | Freq: Once | ORAL | Status: DC | PRN

## 2023-04-06 MED ORDER — SODIUM CHLORIDE 0.9 % IR SOLN
Status: DC | PRN
  Administered 2023-04-06: 3000 mL

## 2023-04-06 MED ORDER — BISACODYL 10 MG RE SUPP: 10.0000 mg | Freq: Every day | RECTAL | Status: DC | PRN

## 2023-04-06 MED ORDER — PROPOFOL 500 MG/50ML IV EMUL
INTRAVENOUS | Status: DC | PRN
  Administered 2023-04-06: 75 ug/kg/min via INTRAVENOUS

## 2023-04-06 MED ORDER — PROPOFOL 1000 MG/100ML IV EMUL
INTRAVENOUS | Status: AC
  Filled 2023-04-06: qty 100

## 2023-04-06 MED ORDER — CHLORHEXIDINE GLUCONATE 0.12 % MT SOLN
15.0000 mL | Freq: Once | OROMUCOSAL | Status: AC
  Administered 2023-04-06: 15 mL via OROMUCOSAL

## 2023-04-06 MED ORDER — MIDAZOLAM HCL 2 MG/2ML IJ SOLN
INTRAMUSCULAR | Status: AC
  Filled 2023-04-06: qty 2

## 2023-04-06 MED ORDER — PHENYLEPHRINE HCL-NACL 20-0.9 MG/250ML-% IV SOLN
INTRAVENOUS | Status: AC
  Filled 2023-04-06: qty 250

## 2023-04-06 MED ORDER — POLYETHYL GLYCOL-PROPYL GLYCOL 0.4-0.3 % OP GEL: Freq: Every day | OPHTHALMIC | Status: DC | PRN

## 2023-04-06 MED ORDER — CEFAZOLIN SODIUM-DEXTROSE 2-4 GM/100ML-% IV SOLN
2.0000 g | INTRAVENOUS | Status: AC
  Administered 2023-04-06: 2 g via INTRAVENOUS

## 2023-04-06 MED ORDER — ENSURE PRE-SURGERY PO LIQD
296.0000 mL | Freq: Once | ORAL | Status: AC
  Administered 2023-04-06: 296 mL via ORAL
  Filled 2023-04-06: qty 296

## 2023-04-06 MED ORDER — GABAPENTIN 300 MG PO CAPS
ORAL_CAPSULE | ORAL | Status: AC
  Filled 2023-04-06: qty 1

## 2023-04-06 MED ORDER — FERROUS SULFATE 325 (65 FE) MG PO TABS
ORAL_TABLET | ORAL | Status: AC
  Filled 2023-04-06: qty 1

## 2023-04-06 MED ORDER — FERROUS SULFATE 325 (65 FE) MG PO TABS
325.0000 mg | ORAL_TABLET | Freq: Two times a day (BID) | ORAL | Status: DC
  Administered 2023-04-06: 325 mg via ORAL

## 2023-04-06 SURGICAL SUPPLY — 77 items
ATTUNE PSFEM RTSZ4 NARCEM KNEE (Femur) IMPLANT
ATTUNE PSRP INSR SZ4 5 KNEE (Insert) IMPLANT
BASEPLATE TIBIAL ROTATING SZ 4 (Knees) IMPLANT
BATTERY INSTRU NAVIGATION (MISCELLANEOUS) ×8 IMPLANT
BLADE SAW 70X12.5 (BLADE) ×2 IMPLANT
BLADE SAW 90X13X1.19 OSCILLAT (BLADE) ×2 IMPLANT
BLADE SAW 90X25X1.19 OSCILLAT (BLADE) ×2 IMPLANT
BONE CEMENT GENTAMICIN (Cement) ×2 IMPLANT
BRUSH SCRUB EZ PLAIN DRY (MISCELLANEOUS) ×2 IMPLANT
BSPLAT TIB 4 CMNT ROT PLAT STR (Knees) ×1 IMPLANT
BTRY SRG DRVR LF (MISCELLANEOUS) ×4
CEMENT BONE GENTAMICIN 40 (Cement) IMPLANT
COOLER POLAR GLACIER W/PUMP (MISCELLANEOUS) ×2 IMPLANT
CUFF TOURN SGL QUICK 24 (TOURNIQUET CUFF)
CUFF TOURN SGL QUICK 30 (TOURNIQUET CUFF) ×1
CUFF TRNQT CYL 24X4X16.5-23 (TOURNIQUET CUFF) IMPLANT
CUFF TRNQT CYL 30X4X21-28X (TOURNIQUET CUFF) IMPLANT
DRAPE 3/4 80X56 (DRAPES) ×2 IMPLANT
DRAPE INCISE IOBAN 66X45 STRL (DRAPES) IMPLANT
DRSG AQUACEL AG ADV 3.5X14 (GAUZE/BANDAGES/DRESSINGS) ×2 IMPLANT
DRSG DERMACEA NONADH 3X8 (GAUZE/BANDAGES/DRESSINGS) ×2 IMPLANT
DRSG MEPILEX SACRM 8.7X9.8 (GAUZE/BANDAGES/DRESSINGS) ×2 IMPLANT
DRSG TEGADERM 4X4.75 (GAUZE/BANDAGES/DRESSINGS) ×2 IMPLANT
DURAPREP 26ML APPLICATOR (WOUND CARE) ×4 IMPLANT
ELECT CAUTERY BLADE 6.4 (BLADE) ×2 IMPLANT
ELECT REM PT RETURN 9FT ADLT (ELECTROSURGICAL) ×1
ELECTRODE REM PT RTRN 9FT ADLT (ELECTROSURGICAL) ×2 IMPLANT
EX-PIN ORTHOLOCK NAV 4X150 (PIN) ×4 IMPLANT
GLOVE BIOGEL M STRL SZ7.5 (GLOVE) ×8 IMPLANT
GLOVE SRG 8 PF TXTR STRL LF DI (GLOVE) ×4 IMPLANT
GLOVE SURG UNDER POLY LF SZ8 (GLOVE) ×2
GOWN STRL REUS W/ TWL LRG LVL3 (GOWN DISPOSABLE) ×2 IMPLANT
GOWN STRL REUS W/ TWL XL LVL3 (GOWN DISPOSABLE) ×2 IMPLANT
GOWN STRL REUS W/TWL LRG LVL3 (GOWN DISPOSABLE) ×1
GOWN STRL REUS W/TWL XL LVL3 (GOWN DISPOSABLE) ×1
GOWN TOGA ZIPPER T7+ PEEL AWAY (MISCELLANEOUS) ×2 IMPLANT
HANDLE YANKAUER SUCT OPEN TIP (MISCELLANEOUS) ×2 IMPLANT
HEMOVAC 400CC 10FR (MISCELLANEOUS) ×2 IMPLANT
HOLDER FOLEY CATH W/STRAP (MISCELLANEOUS) ×2 IMPLANT
HOOD PEEL AWAY T7 (MISCELLANEOUS) ×2 IMPLANT
IV NS IRRIG 3000ML ARTHROMATIC (IV SOLUTION) ×2 IMPLANT
KIT TURNOVER KIT A (KITS) ×2 IMPLANT
KNIFE SCULPS 14X20 (INSTRUMENTS) ×2 IMPLANT
MANIFOLD NEPTUNE II (INSTRUMENTS) ×4 IMPLANT
NDL SPNL 20GX3.5 QUINCKE YW (NEEDLE) ×4 IMPLANT
NEEDLE SPNL 20GX3.5 QUINCKE YW (NEEDLE) ×2 IMPLANT
PACK TOTAL KNEE (MISCELLANEOUS) ×2 IMPLANT
PAD ABD DERMACEA PRESS 5X9 (GAUZE/BANDAGES/DRESSINGS) ×4 IMPLANT
PAD ARMBOARD 7.5X6 YLW CONV (MISCELLANEOUS) ×6 IMPLANT
PAD WRAPON POLAR KNEE (MISCELLANEOUS) ×2 IMPLANT
PATELLA MEDIAL ATTUN 35MM KNEE (Knees) IMPLANT
PENCIL SMOKE EVACUATOR COATED (MISCELLANEOUS) ×2 IMPLANT
PIN DRILL FIX HALF THREAD (BIT) ×4 IMPLANT
PIN DRILL QUICK PACK (PIN) ×4 IMPLANT
PIN FIXATION 1/8DIA X 3INL (PIN) ×2 IMPLANT
PULSAVAC PLUS IRRIG FAN TIP (DISPOSABLE) ×1
SOL PREP PVP 2OZ (MISCELLANEOUS) ×1
SOLUTION IRRIG SURGIPHOR (IV SOLUTION) ×2 IMPLANT
SOLUTION PREP PVP 2OZ (MISCELLANEOUS) ×2 IMPLANT
SPONGE DRAIN TRACH 4X4 STRL 2S (GAUZE/BANDAGES/DRESSINGS) ×2 IMPLANT
STAPLER SKIN PROX 35W (STAPLE) ×2 IMPLANT
STOCKINETTE BIAS CUT 6 980064 (GAUZE/BANDAGES/DRESSINGS) IMPLANT
STOCKINETTE IMPERV 14X48 (MISCELLANEOUS) ×2 IMPLANT
STRAP TIBIA SHORT (MISCELLANEOUS) ×2 IMPLANT
SUCTION TUBE FRAZIER 10FR DISP (SUCTIONS) ×2 IMPLANT
SUT VIC AB 0 CT1 36 (SUTURE) ×2 IMPLANT
SUT VIC AB 1 CT1 36 (SUTURE) ×4 IMPLANT
SUT VIC AB 2-0 CT2 27 (SUTURE) ×2 IMPLANT
SYR 30ML LL (SYRINGE) ×4 IMPLANT
TIP FAN IRRIG PULSAVAC PLUS (DISPOSABLE) ×2 IMPLANT
TOWEL OR 17X26 4PK STRL BLUE (TOWEL DISPOSABLE) IMPLANT
TOWER CARTRIDGE SMART MIX (DISPOSABLE) ×2 IMPLANT
TRAP FLUID SMOKE EVACUATOR (MISCELLANEOUS) ×2 IMPLANT
TRAY FOLEY MTR SLVR 16FR STAT (SET/KITS/TRAYS/PACK) ×2 IMPLANT
TUBING CONNECTING 10 (TUBING) ×4 IMPLANT
WATER STERILE IRR 1000ML POUR (IV SOLUTION) ×2 IMPLANT
WRAPON POLAR PAD KNEE (MISCELLANEOUS) ×1

## 2023-04-06 NOTE — Anesthesia Preprocedure Evaluation (Signed)
Anesthesia Evaluation  Patient identified by MRN, date of birth, ID band Patient awake    Reviewed: Allergy & Precautions, H&P , NPO status , Patient's Chart, lab work & pertinent test results, reviewed documented beta blocker date and time   History of Anesthesia Complications (+) PONV and history of anesthetic complications  Airway Mallampati: III   Neck ROM: full    Dental  (+) Poor Dentition   Pulmonary neg pulmonary ROS, former smoker   Pulmonary exam normal        Cardiovascular Exercise Tolerance: Poor negative cardio ROS Normal cardiovascular exam Rhythm:regular Rate:Normal     Neuro/Psych  Headaches PSYCHIATRIC DISORDERS Anxiety Depression     Neuromuscular disease    GI/Hepatic Neg liver ROS,GERD  Medicated,,  Endo/Other  Hypothyroidism    Renal/GU negative Renal ROS  negative genitourinary   Musculoskeletal   Abdominal   Peds  Hematology  (+) Blood dyscrasia, anemia   Anesthesia Other Findings Past Medical History: No date: Anemia     Comment:  Jan slightly anemic No date: Anxiety No date: Arthritis     Comment:  FINGERS, knees No date: Carotid artery stenosis No date: Cataract No date: Complication of anesthesia     Comment:  nausea, headaches, achy legs No date: Family history of adverse reaction to anesthesia     Comment:  SISTERS BOTH GET NAUSEATED No date: GERD (gastroesophageal reflux disease) 05/2017: Headache     Comment:  migraines - precipitated by extended use of               celebrex/meloxicam 07/11/2020: History of 2019 novel coronavirus disease (COVID-19) No date: Hyperlipidemia 04/2009: Hypothyroidism     Comment:  s/p ablation/ GRAVES DISEASE No date: Neuromuscular disorder (HCC)     Comment:  NUMBNESS IN HANDS OR LIPS OCCASIONALLY/ carpal tunnel No date: PONV (postoperative nausea and vomiting)     Comment:  AFTER 11-29-20 SURGERY No date: Pre-diabetes No date:  Psoriasis Past Surgical History: 03/2007: ABDOMINAL HYSTERECTOMY 10/25/2021: ACHILLES TENDON SURGERY; Right     Comment:  Procedure: ACHILLES TENDON REPAIR - SECONDARY;  Surgeon:              Gwyneth Revels, DPM;  Location: [facility];  Service:               Podiatry;  Laterality: Right; 09/18/2022: BREAST BIOPSY; Right     Comment:  Stereo bx, Calcs, X-clip, path pending 09/18/2022: BREAST BIOPSY; Right     Comment:  MM RT BREAST BX W LOC DEV 1ST LESION IMAGE BX SPEC               STEREO GUIDE 09/18/2022 [facility] 06/16/2018: CATARACT EXTRACTION W/PHACO; Left     Comment:  Procedure: CATARACT EXTRACTION PHACO AND INTRAOCULAR               LENS PLACEMENT (IOC) LEFT;  Surgeon: Lockie Mola, MD;  Location: [facility];  Service:               Ophthalmology;  Laterality: Left; 07/27/2019: CATARACT EXTRACTION W/PHACO; Right     Comment:  Procedure: CATARACT EXTRACTION PHACO AND INTRAOCULAR               LENS PLACEMENT (IOC) RIGHT TECNIS ADD 6.17, 00:56.9,               27.1%;  Surgeon: Lockie Mola, MD;  Location:  [facility];  Service: Ophthalmology;                Laterality: Right; No date: CESAREAN SECTION     Comment:  x2 10/20/2016: COLONOSCOPY WITH PROPOFOL; N/A     Comment:  Procedure: COLONOSCOPY WITH PROPOFOL;  Surgeon: Midge Minium, MD;  Location: [facility];  Service:               Endoscopy;  Laterality: N/A; 01/11/2018: COLONOSCOPY WITH PROPOFOL; N/A     Comment:  Procedure: COLONOSCOPY WITH PROPOFOL;  Surgeon: Midge Minium, MD;  Location: [facility];  Service:               Endoscopy;  Laterality: N/A; 07/11/2022: COLONOSCOPY WITH PROPOFOL; N/A     Comment:  Procedure: COLONOSCOPY WITH BIOPSY;  Surgeon: Midge Minium, MD;  Location: [facility];  Service:               Endoscopy;  Laterality: N/A; No date: DILATION AND CURETTAGE OF  UTERUS 06/24/2017: ENDARTERECTOMY; Right     Comment:  Procedure: ENDARTERECTOMY CAROTID;  Surgeon: Annice Needy, MD;  Location: [facility];  Service: Vascular;                Laterality: Right; 09/14/2015: KNEE ARTHROSCOPY; Left     Comment:  Procedure: ARTHROSCOPY KNEE;  Surgeon: Erin Sons,               MD;  Location: [facility];  Service:               Orthopedics;  Laterality: Left; 01/15/2021: KNEE ARTHROSCOPY; Left     Comment:  Procedure: Left knee arthroscopy for arthrofibrosis,               extensive synovectomy;  Surgeon: Kennedy Bucker, MD;                Location: [facility];  Service: Orthopedics;  Laterality:               Left; 05/12/2014: KNEE ARTHROSCOPY W/ PARTIAL MEDIAL MENISCECTOMY; Right 01/01/2021: KNEE CLOSED REDUCTION; Left     Comment:  Procedure: CLOSED MANIPULATION KNEE;  Surgeon: Kennedy Bucker, MD;  Location: [facility];  Service: Orthopedics;               Laterality: Left; 04/30/2021: KNEE CLOSED REDUCTION; Left     Comment:  Procedure: CLOSED MANIPULATION KNEE;  Surgeon: Kennedy Bucker, MD;  Location: [facility];  Service: Orthopedics;               Laterality: Left; 10/25/2021: OSTECTOMY; Right     Comment:  Procedure: CALCANEAL EXOSTECTOMY;  Surgeon: Gwyneth Revels, DPM;  Location: [facility];  Service: Podiatry;                Laterality: Right; 10/20/2016: POLYPECTOMY     Comment:  Procedure: POLYPECTOMY;  Surgeon: Midge Minium, MD;  Location: [facility];  Service: Endoscopy;; 01/11/2018: POLYPECTOMY     Comment:  Procedure: POLYPECTOMY;  Surgeon: Midge Minium, MD;                Location: [facility];  Service: Endoscopy;; 07/11/2022: POLYPECTOMY     Comment:  Procedure: POLYPECTOMY;  Surgeon: Midge Minium, MD;                Location: [facility];  Service: Endoscopy;; 2010: THYROID SURGERY     Comment:  ablation 11/29/2020: TOTAL KNEE  ARTHROPLASTY; Left     Comment:  Procedure: Left total knee arthroplasty - [place]               to Assist;  Surgeon: Kennedy Bucker, MD;  Location: [facility]               [facility];  Service: Orthopedics;  Laterality: Left; 03/12/2021: TOTAL KNEE REVISION; Left     Comment:  Procedure: Revision of femoral component of total knee,               left - RNFA;  Surgeon: Kennedy Bucker, MD;  Location: [facility]              [facility];  Service: Orthopedics;  Laterality: Left; No date: TUBAL LIGATION BMI    Body Mass Index: 34.37 kg/m     Reproductive/Obstetrics negative OB ROS                             Anesthesia Physical Anesthesia Plan  ASA: 3  Anesthesia Plan: Spinal   Post-op Pain Management:    Induction:   PONV Risk Score and Plan: 4 or greater  Airway Management Planned:   Additional Equipment:   Intra-op Plan:   Post-operative Plan:   Informed Consent: I have reviewed the patients History and Physical, chart, labs and discussed the procedure including the risks, benefits and alternatives for the proposed anesthesia with the patient or authorized representative who has indicated his/her understanding and acceptance.     Dental Advisory Given  Plan Discussed with: CRNA  Anesthesia Plan Comments:        Anesthesia Quick Evaluation

## 2023-04-06 NOTE — Progress Notes (Signed)
PT Cancellation Note  Patient Details Name: Annette [name] MRN: [id number] DOB: 1955-12-16   Cancelled Treatment:    Reason Eval/Treat Not Completed: Patient not medically ready.  PT consult received.  Chart reviewed.  Nurse reports pt not appropriate for therapy participation at this time (pt s/p R TKA with spinal anesthesia and currently in PACU).  Will re-attempt PT evaluation tomorrow.  Hendricks Limes, PT 04/06/23, 4:20 PM

## 2023-04-06 NOTE — Anesthesia Procedure Notes (Signed)
Spinal  Patient location during procedure: OR Start time: 04/06/2023 11:42 AM End time: 04/06/2023 11:52 AM Staffing Performed: resident/CRNA  Resident/CRNA: Lanell Matar, CRNA Performed by: Lanell Matar, CRNA Authorized by: Yevette Edwards, MD   Preanesthetic Checklist Completed: patient identified, IV checked, site marked, risks and benefits discussed, surgical consent, monitors and equipment checked, pre-op evaluation and timeout performed Spinal Block Patient position: sitting Prep: ChloraPrep Patient monitoring: heart rate, continuous pulse ox and blood pressure Approach: midline Location: L3-4 Injection technique: single-shot Needle Needle type: Pencan  Needle gauge: 24 G Needle length: 10 cm Needle insertion depth: 7 cm Assessment Sensory level: T10 Events: CSF return

## 2023-04-06 NOTE — Op Note (Signed)
OPERATIVE NOTE  DATE OF SURGERY:  04/06/2023  PATIENT NAME:  DABRIA GUDIEL   DOB: [birth year]  MRN: [id number]  PRE-OPERATIVE DIAGNOSIS: Degenerative arthrosis of the right knee, primary  POST-OPERATIVE DIAGNOSIS:  Same  PROCEDURE:  Right total knee arthroplasty using computer-assisted navigation  SURGEON:  Jena Gauss. M.D.  ASSISTANT:  Gean Birchwood, PA-C (present and scrubbed throughout the case, critical for assistance with exposure, retraction, instrumentation, and closure)  ANESTHESIA: spinal  ESTIMATED BLOOD LOSS: 50 mL  FLUIDS REPLACED: 1300 mL of crystalloid  TOURNIQUET TIME: 92 minutes  DRAINS: 2 medium Hemovac drains  SOFT TISSUE RELEASES: Anterior cruciate ligament, posterior cruciate ligament, deep medial collateral ligament, patellofemoral ligament  IMPLANTS UTILIZED: DePuy Attune size 4N posterior stabilized femoral component (cemented), size 4 rotating platform tibial component (cemented), 35 mm medialized dome patella (cemented), and a 5 mm stabilized rotating platform polyethylene insert.  INDICATIONS FOR SURGERY: Annette [name] is a 67 y.o. year old female with a long history of progressive knee pain. X-rays demonstrated severe degenerative changes in tricompartmental fashion. The patient had not seen any significant improvement despite conservative nonsurgical intervention. After discussion of the risks and benefits of surgical intervention, the patient expressed understanding of the risks benefits and agree with plans for total knee arthroplasty.   The risks, benefits, and alternatives were discussed at length including but not limited to the risks of infection, bleeding, nerve injury, stiffness, blood clots, the need for revision surgery, cardiopulmonary complications, among others, and they were willing to proceed.  PROCEDURE IN DETAIL: The patient was brought into the operating room and, after adequate spinal anesthesia was achieved, a tourniquet  was placed on the patient's upper thigh. The patient's knee and leg were cleaned and prepped with alcohol and DuraPrep and draped in the usual sterile fashion. A "timeout" was performed as per usual protocol. The lower extremity was exsanguinated using an Esmarch, and the tourniquet was inflated to 300 mmHg. An anterior longitudinal incision was made followed by a standard mid vastus approach. The deep fibers of the medial collateral ligament were elevated in a subperiosteal fashion off of the medial flare of the tibia so as to maintain a continuous soft tissue sleeve. The patella was subluxed laterally and the patellofemoral ligament was incised. Inspection of the knee demonstrated severe degenerative changes with full-thickness loss of articular cartilage. Osteophytes were debrided using a rongeur. Anterior and posterior cruciate ligaments were excised. Two 4.0 mm Schanz pins were inserted in the femur and into the tibia for attachment of the array of trackers used for computer-assisted navigation. Hip center was identified using a circumduction technique. Distal landmarks were mapped using the computer. The distal femur and proximal tibia were mapped using the computer. The distal femoral cutting guide was positioned using computer-assisted navigation so as to achieve a 5 distal valgus cut. The femur was sized and it was felt that a size 4N femoral component was appropriate. A size 4 femoral cutting guide was positioned and the anterior cut was performed and verified using the computer. This was followed by completion of the posterior and chamfer cuts. Femoral cutting guide for the central box was then positioned in the center box cut was performed.  Attention was then directed to the proximal tibia. Medial and lateral menisci were excised. The extramedullary tibial cutting guide was positioned using computer-assisted navigation so as to achieve a 0 varus-valgus alignment and 3 posterior slope. The cut was  performed and verified using the computer. The proximal tibia  was sized and it was felt that a size 4 tibial tray was appropriate. Tibial and femoral trials were inserted followed by insertion of a 5 mm polyethylene insert. This allowed for excellent mediolateral soft tissue balancing both in flexion and in full extension. Finally, the patella was cut and prepared so as to accommodate a 35 mm medialized dome patella. A patella trial was placed and the knee was placed through a range of motion with excellent patellar tracking appreciated. The femoral trial was removed after debridement of posterior osteophytes. The central post-hole for the tibial component was reamed followed by insertion of a keel punch. Tibial trials were then removed. Cut surfaces of bone were irrigated with copious amounts of normal saline using pulsatile lavage and then suctioned dry. Polymethylmethacrylate cement with gentamicin was prepared in the usual fashion using a vacuum mixer. Cement was applied to the cut surface of the proximal tibia as well as along the undersurface of a size 4 rotating platform tibial component. Tibial component was positioned and impacted into place. Excess cement was removed using Personal assistant. Cement was then applied to the cut surfaces of the femur as well as along the posterior flanges of the size 4N femoral component. The femoral component was positioned and impacted into place. Excess cement was removed using Personal assistant. A 5 mm polyethylene trial was inserted and the knee was brought into full extension with steady axial compression applied. Finally, cement was applied to the backside of a 35 mm medialized dome patella and the patellar component was positioned and patellar clamp applied. Excess cement was removed using Personal assistant. After adequate curing of the cement, the tourniquet was deflated after a total tourniquet time of 92 minutes. Hemostasis was achieved using electrocautery. The knee was  irrigated with copious amounts of normal saline using pulsatile lavage followed by 450 ml of Surgiphor and then suctioned dry. 20 mL of 1.3% Exparel and 60 mL of 0.25% Marcaine in 40 mL of normal saline was injected along the posterior capsule, medial and lateral gutters, and along the arthrotomy site. A 5 mm stabilized rotating platform polyethylene insert was inserted and the knee was placed through a range of motion with excellent mediolateral soft tissue balancing appreciated and excellent patellar tracking noted. 2 medium drains were placed in the wound bed and brought out through separate stab incisions. The medial parapatellar portion of the incision was reapproximated using interrupted sutures of #1 Vicryl. Subcutaneous tissue was approximated in layers using first #0 Vicryl followed #2-0 Vicryl. The skin was approximated with skin staples. A sterile dressing was applied.  The patient tolerated the procedure well and was transported to the recovery room in stable condition.     P. Angie Fava., M.D.

## 2023-04-06 NOTE — Interval H&P Note (Signed)
History and Physical Interval Note:  04/06/2023 10:50 AM  Annette [name]  has presented today for surgery, with the diagnosis of PRIMARY OSTEOARTHRITIS OF RIGHT KNEE..  The various methods of treatment have been discussed with the patient and family. After consideration of risks, benefits and other options for treatment, the patient has consented to  Procedure(s): COMPUTER ASSISTED TOTAL KNEE ARTHROPLASTY (Right) as a surgical intervention.  The patient's history has been reviewed, patient examined, no change in status, stable for surgery.  I have reviewed the patient's chart and labs.  Questions were answered to the patient's satisfaction.      P 

## 2023-04-06 NOTE — Plan of Care (Signed)
Continue to progress toward discharge

## 2023-04-06 NOTE — Transfer of Care (Signed)
Immediate Anesthesia Transfer of Care Note  Patient: Annette [name]  Procedure(s) Performed: COMPUTER ASSISTED TOTAL KNEE ARTHROPLASTY (Right: Knee)  Patient Location: PACU  Anesthesia Type:Spinal  Level of Consciousness: awake and drowsy  Airway & Oxygen Therapy: Patient Spontanous Breathing and Patient connected to face mask oxygen  Post-op Assessment: Report given to RN and Post -op Vital signs reviewed and stable  Post vital signs: Reviewed and stable  Last Vitals:  Vitals Value Taken Time  BP 110/51 04/06/23 1522  Temp    Pulse 91 04/06/23 1522  Resp 16 04/06/23 1522  SpO2 96 % 04/06/23 1522    Last Pain:  Vitals:   04/06/23 0951  TempSrc: Temporal  PainSc: 0-No pain         Complications: No notable events documented.

## 2023-04-06 NOTE — Progress Notes (Signed)
Patient is not able to walk the distance required to go the bathroom, or he/she is unable to safely negotiate stairs required to access the bathroom.  A 3in1 BSC will alleviate this problem   James P. Hooten, Jr. M.D.  

## 2023-04-07 ENCOUNTER — Encounter: Payer: Self-pay | Admitting: Orthopedic Surgery

## 2023-04-07 DIAGNOSIS — M1711 Unilateral primary osteoarthritis, right knee: Secondary | ICD-10-CM | POA: Diagnosis not present

## 2023-04-07 MED ORDER — SENNOSIDES-DOCUSATE SODIUM 8.6-50 MG PO TABS
ORAL_TABLET | ORAL | Status: AC
  Filled 2023-04-07: qty 1

## 2023-04-07 MED ORDER — OXYCODONE HCL 5 MG PO TABS
ORAL_TABLET | ORAL | Status: AC
  Filled 2023-04-07: qty 2

## 2023-04-07 MED ORDER — ASPIRIN EC 81 MG PO TBEC: 81.0000 mg | DELAYED_RELEASE_TABLET | Freq: Two times a day (BID) | ORAL | Status: AC

## 2023-04-07 MED ORDER — OXYCODONE HCL 5 MG PO TABS: 5.0000 mg | ORAL_TABLET | ORAL | 0 refills | Status: AC | PRN

## 2023-04-07 MED ORDER — TRAMADOL HCL 50 MG PO TABS: 50.0000 mg | ORAL_TABLET | Freq: Four times a day (QID) | ORAL | 0 refills | Status: AC | PRN

## 2023-04-07 MED ORDER — METOCLOPRAMIDE HCL 10 MG PO TABS
ORAL_TABLET | ORAL | Status: AC
  Filled 2023-04-07: qty 1

## 2023-04-07 MED ORDER — OXYCODONE HCL 5 MG PO TABS
ORAL_TABLET | ORAL | Status: AC
  Filled 2023-04-07: qty 1

## 2023-04-07 MED ORDER — PANTOPRAZOLE SODIUM 40 MG PO TBEC
DELAYED_RELEASE_TABLET | ORAL | Status: AC
  Filled 2023-04-07: qty 1

## 2023-04-07 MED ORDER — MAGNESIUM HYDROXIDE 400 MG/5ML PO SUSP
ORAL | Status: AC
  Filled 2023-04-07: qty 30

## 2023-04-07 MED ORDER — ASPIRIN 81 MG PO CHEW
CHEWABLE_TABLET | ORAL | Status: AC
  Filled 2023-04-07: qty 1

## 2023-04-07 NOTE — Plan of Care (Signed)
  Problem: Pain Management: Goal: Pain level will decrease with appropriate interventions Outcome: Progressing   Problem: Skin Integrity: Goal: Will show signs of wound healing Outcome: Progressing   

## 2023-04-07 NOTE — Discharge Summary (Cosign Needed Addendum)
Physician Discharge Summary  Subjective: 1 Day Post-Op Procedure(s) (LRB): COMPUTER ASSISTED TOTAL KNEE ARTHROPLASTY (Right) Patient reports pain as 5 on 0-10 scale.  States that however she has not taken any pain medications recently, is waiting to have breakfast that way she has something in her stomach.  Is planning to take pain medication also before working with PT. Patient seen in rounds with Dr. Ernest Pine. Patient is well, and has had no acute complaints or problems.  Denies having any fevers, chills, shortness of breath, chest pain or N/V.  Patient has been able to go to the bathroom. Patient has passed her PT protocols and feels that she is ready to go home. Patient is ready to go home  Physician Discharge Summary  Patient ID: Annette [name] MRN: [id number] DOB/AGE: November 02, 1955 67 y.o.  Admit date: 04/06/2023 Discharge date: 04/07/2023  Admission Diagnoses:  Discharge Diagnoses:  Principal Problem:   Total knee replacement status   Discharged Condition: good  Hospital Course: Patient presented to the hospital on 04/06/2023 for an elective right total knee arthroplasty performed by Dr. Ernest Pine.  Patient was given 1 g of TXA and 2 g of Ancef perioperatively.  She tolerated the surgery well without any complications.  See operative details below.  Postoperatively, the patient states that she did well, she was not able to work with PT same day of surgery, but was able to pass her PT protocols postop day 1.  She has gone to the bathroom.  She states that her pain was slightly increased postop day 1, but it was because she had not taken any pain medication overnight, was waiting to have breakfast and before working with PT before taking any pain medication.  Vital signs are stable, denies any chest pain, shortness of breath, fevers, chills, nausea or vomiting.  Patient stable for discharge.  PROCEDURE:  Right total knee arthroplasty using computer-assisted navigation   SURGEON:  Jena Gauss. M.D.   ASSISTANT:  Gean Birchwood, PA-C (present and scrubbed throughout the case, critical for assistance with exposure, retraction, instrumentation, and closure)   ANESTHESIA: spinal   ESTIMATED BLOOD LOSS: 50 mL   FLUIDS REPLACED: 1300 mL of crystalloid   TOURNIQUET TIME: 92 minutes   DRAINS: 2 medium Hemovac drains   SOFT TISSUE RELEASES: Anterior cruciate ligament, posterior cruciate ligament, deep medial collateral ligament, patellofemoral ligament   IMPLANTS UTILIZED: DePuy Attune size 4N posterior stabilized femoral component (cemented), size 4 rotating platform tibial component (cemented), 35 mm medialized dome patella (cemented), and a 5 mm stabilized rotating platform polyethylene insert.  Treatments: None  Discharge Exam: Blood pressure (!) 107/49, pulse 63, temperature 97.9 F (36.6 C), resp. rate 16, height 5' (1.524 m), weight 79.8 kg, SpO2 97%.   Disposition: Home   Allergies as of 04/07/2023       Reactions   Penicillins Swelling, Rash   IgE = 9 (WNL) on 03/31/2023   Celebrex [celecoxib] Nausea Only, Other (See Comments)   Precipitates migraines with extended use    Doxycycline    Headache   Tramadol Other (See Comments)   headaches   Advil [ibuprofen] Rash   Facial swelling   Clindamycin/lincomycin Nausea And Vomiting   headaches   Meloxicam    Causes headaches when taken for an extended period         Medication List     STOP taking these medications    Excedrin Extra Strength 250-250-65 MG tablet Generic drug: aspirin-acetaminophen-caffeine  TAKE these medications    acetaminophen 500 MG tablet Commonly known as: TYLENOL Take 1,000 mg by mouth every 6 (six) hours as needed.   ALPRAZolam 0.25 MG tablet Commonly known as: XANAX Take 0.25 mg by mouth at bedtime.   alum & mag hydroxide-simeth 200-200-20 MG/5ML suspension Commonly known as: MAALOX/MYLANTA Take 15 mLs by mouth at bedtime as needed for indigestion or  heartburn.   aspirin EC 81 MG tablet Take 1 tablet (81 mg total) by mouth in the morning and at bedtime. Swallow whole. What changed: when to take this   atorvastatin 80 MG tablet Commonly known as: LIPITOR Take 80 mg by mouth at bedtime.   CALCIUM 600 + D PO Take 1 tablet by mouth daily.   citalopram 20 MG tablet Commonly known as: CELEXA Take 10 mg by mouth every morning. am   clobetasol ointment 0.05 % Commonly known as: TEMOVATE Apply 1 application topically 2 (two) times daily as needed (psoriasis).   fluticasone 50 MCG/ACT nasal spray Commonly known as: FLONASE Place 1 spray into both nostrils daily as needed for allergies or rhinitis.   levothyroxine 50 MCG tablet Commonly known as: SYNTHROID Take 50 mcg by mouth daily before breakfast.   oxyCODONE 5 MG immediate release tablet Commonly known as: Oxy IR/ROXICODONE Take 1 tablet (5 mg total) by mouth every 4 (four) hours as needed for moderate pain (pain score 4-6).   SYSTANE OP Place 1 drop into both eyes daily as needed (dry eyes).   traMADol 50 MG tablet Commonly known as: Ultram Take 1 tablet (50 mg total) by mouth every 6 (six) hours as needed.   Vitamin D3 50 MCG (2000 UT) Tabs Take 1 tablet by mouth daily.               Durable Medical Equipment  (From admission, onward)           Start     Ordered   04/06/23 1632  DME Walker rolling  Once       Question:  Patient needs a walker to treat with the following condition  Answer:  Total knee replacement status   04/06/23 1632   04/06/23 1632  DME Bedside commode  Once       Comments: Patient is not able to walk the distance required to go the bathroom, or he/she is unable to safely negotiate stairs required to access the bathroom.  A 3in1 BSC will alleviate this problem  Question:  Patient needs a bedside commode to treat with the following condition  Answer:  Total knee replacement status   04/06/23 1632            Follow-up Information      Rayburn Go, PA-C Follow up on 04/21/2023.   Specialty: Orthopedic Surgery Why: at 1:45pm Contact information: [address] [place] [address] [phone]         Donato Heinz, MD Follow up on 05/19/2023.   Specialty: Orthopedic Surgery Why: at 11:30am Contact information: [address] [facility] [place] [address] [phone]                 Signed: Gean Birchwood 04/07/2023, 8:33 AM   Objective: Vital signs in last 24 hours: Temp:  [97.3 F (36.3 C)-98.4 F (36.9 C)] 97.9 F (36.6 C) (08/13 0720) Pulse Rate:  [63-101] 63 (08/13 0720) Resp:  [13-18] 16 (08/13 0720) BP: (107-134)/(43-70) 107/49 (08/13 0720) SpO2:  [93 %-100 %] 97 % (08/13  0720) Weight:  [79.8 kg] 79.8 kg (08/12 0951)  Intake/Output from previous day:  Intake/Output Summary (Last 24 hours) at 04/07/2023 0833 Last data filed at 04/07/2023 0548 Gross per 24 hour  Intake 3057.59 ml  Output 1520 ml  Net 1537.59 ml    Intake/Output this shift: No intake/output data recorded.  Labs: No results for input(s): "HGB" in the last 72 hours. No results for input(s): "WBC", "RBC", "HCT", "PLT" in the last 72 hours. No results for input(s): "NA", "K", "CL", "CO2", "BUN", "CREATININE", "GLUCOSE", "CALCIUM" in the last 72 hours. No results for input(s): "LABPT", "INR" in the last 72 hours.  EXAM: General - Patient is Alert, Appropriate, and Oriented Extremity - Neurologically intact ABD soft Neurovascular intact Sensation intact distally Intact pulses distally Dorsiflexion/Plantar flexion intact No cellulitis present Compartment soft Dressing - dressing C/D/I and scant drainage appreciated at the inferior portion of the bandage Motor Function - intact, moving foot and toes well on exam.  Patient is able to straight leg raise without difficulty.  She is also able to plantar and dorsiflex with good range of motion and strength.  Patient is neurovascularly  intact down her right lower extremity to all dermatomes.  Pedal pulses appreciated, 2+. JP Drain pulled without difficulty. Intact  Assessment/Plan: 1 Day Post-Op Procedure(s) (LRB): COMPUTER ASSISTED TOTAL KNEE ARTHROPLASTY (Right) Procedure(s) (LRB): COMPUTER ASSISTED TOTAL KNEE ARTHROPLASTY (Right) Past Medical History:  Diagnosis Date   Anemia    Jan slightly anemic   Anxiety    Arthritis    FINGERS, knees   Carotid artery stenosis    Cataract    Complication of anesthesia    nausea, headaches, achy legs   Family history of adverse reaction to anesthesia    SISTERS BOTH GET NAUSEATED   GERD (gastroesophageal reflux disease)    Headache 05/2017   migraines - precipitated by extended use of celebrex/meloxicam   History of 2019 novel coronavirus disease (COVID-19) 07/11/2020   Hyperlipidemia    Hypothyroidism 04/2009   s/p ablation/ GRAVES DISEASE   Neuromuscular disorder (HCC)    NUMBNESS IN HANDS OR LIPS OCCASIONALLY/ carpal tunnel   PONV (postoperative nausea and vomiting)    AFTER 11-29-20 SURGERY   Pre-diabetes    Psoriasis    Principal Problem:   Total knee replacement status  Estimated body mass index is 34.37 kg/m as calculated from the following:   Height as of this encounter: 5' (1.524 m).   Weight as of this encounter: 79.8 kg.  Patient has passed her PT protocols, and is stable for discharge.  Look to transition to home health physical therapy to continue to work on ambulation, strength and range of motion training with his right leg.  Will look to transition to outpatient physical therapy at 2 weeks time.  Discussed with the patient continuing to utilize Polar Care   Patient will use bone foam in 20-30 minute intervals   Patient will wear TED hose bilaterally to help prevent DVT and clot formation   Discussed the Aquacel bandage.  This bandage will stay in place 7 days postoperatively.  Can be replaced with honeycomb bandages that will be sent home with  the patient   Discussed sending the patient home with tramadol and oxycodone for as needed pain management.  Patient has previously had issues taking tramadol, however states that she would like to try it, just reports having a headache last time she took it.  Patient has an allergy to NSAIDs, we will not send this  patient home with Celebrex.  Patient will take an 81 mg aspirin twice daily for DVT prophylaxis   JP drain removed without difficulty, intact   Weight-Bearing as tolerated to right leg   Patient will follow-up with [facility] orthopedics in 2 weeks for staple removal and reevaluation  Diet - Regular diet Follow up - in 2 weeks Activity - WBAT Disposition - Home Condition Upon Discharge - Good DVT Prophylaxis - Aspirin and TED hose  Danise Edge, PA-C Orthopaedic Surgery 04/07/2023, 8:33 AM

## 2023-04-07 NOTE — Progress Notes (Signed)
DISCHARGE NOTE:  Pt given discharge instructions and scripts. TED hose on both legs, 2 honeycomb dressing sent with pt, along with BSC. Pt wheeled to car by staff, husband providing transportation.

## 2023-04-07 NOTE — Progress Notes (Signed)
Subjective: 1 Day Post-Op Procedure(s) (LRB): COMPUTER ASSISTED TOTAL KNEE ARTHROPLASTY (Right) Patient reports pain as 5 on 0-10 scale.  States that however she has not taken any pain medications recently, is waiting to have breakfast that way she has something in her stomach.  Is planning to take pain medication also before working with PT. Patient seen in rounds with Dr. Ernest Pine. Patient is well, and has had no acute complaints or problems.  Denies having any fevers, chills, shortness of breath, chest pain or N/V.  Patient has been able to go to the bathroom. We will start therapy today.  Plan is to go Home after hospital stay.  Objective: Vital signs in last 24 hours: Temp:  [97.3 F (36.3 C)-98.4 F (36.9 C)] 97.9 F (36.6 C) (08/13 0720) Pulse Rate:  [63-101] 63 (08/13 0720) Resp:  [13-18] 16 (08/13 0720) BP: (107-134)/(43-70) 107/49 (08/13 0720) SpO2:  [93 %-100 %] 97 % (08/13 0720) Weight:  [79.8 kg] 79.8 kg (08/12 0951)  Intake/Output from previous day:  Intake/Output Summary (Last 24 hours) at 04/07/2023 0746 Last data filed at 04/07/2023 0548 Gross per 24 hour  Intake 3057.59 ml  Output 1520 ml  Net 1537.59 ml    Intake/Output this shift: No intake/output data recorded.  Labs: No results for input(s): "HGB" in the last 72 hours. No results for input(s): "WBC", "RBC", "HCT", "PLT" in the last 72 hours. No results for input(s): "NA", "K", "CL", "CO2", "BUN", "CREATININE", "GLUCOSE", "CALCIUM" in the last 72 hours. No results for input(s): "LABPT", "INR" in the last 72 hours.  EXAM General - Patient is Alert, Appropriate, and Oriented Extremity - Neurologically intact ABD soft Neurovascular intact Sensation intact distally Intact pulses distally Dorsiflexion/Plantar flexion intact No cellulitis present Compartment soft Dressing - dressing C/D/I and scant drainage appreciated at the inferior portion of the bandage Motor Function - intact, moving foot and toes  well on exam.  Patient is able to straight leg raise without difficulty.  She is also able to plantar and dorsiflex with good range of motion and strength.  Patient is neurovascularly intact down her right lower extremity to all dermatomes.  Pedal pulses appreciated, 2+. JP Drain pulled without difficulty. Intact  Past Medical History:  Diagnosis Date   Anemia    Jan slightly anemic   Anxiety    Arthritis    FINGERS, knees   Carotid artery stenosis    Cataract    Complication of anesthesia    nausea, headaches, achy legs   Family history of adverse reaction to anesthesia    SISTERS BOTH GET NAUSEATED   GERD (gastroesophageal reflux disease)    Headache 05/2017   migraines - precipitated by extended use of celebrex/meloxicam   History of 2019 novel coronavirus disease (COVID-19) 07/11/2020   Hyperlipidemia    Hypothyroidism 04/2009   s/p ablation/ GRAVES DISEASE   Neuromuscular disorder (HCC)    NUMBNESS IN HANDS OR LIPS OCCASIONALLY/ carpal tunnel   PONV (postoperative nausea and vomiting)    AFTER 11-29-20 SURGERY   Pre-diabetes    Psoriasis     Assessment/Plan: 1 Day Post-Op Procedure(s) (LRB): COMPUTER ASSISTED TOTAL KNEE ARTHROPLASTY (Right) Principal Problem:   Total knee replacement status  Estimated body mass index is 34.37 kg/m as calculated from the following:   Height as of this encounter: 5' (1.524 m).   Weight as of this encounter: 79.8 kg. Advance diet Up with therapy  Patient will continue to work with physical therapy to pass postoperative PT protocols,  ROM and strengthening  Discussed with the patient continuing to utilize Polar Care  Patient will use bone foam in 20-30 minute intervals  Patient will wear TED hose bilaterally to help prevent DVT and clot formation  Discussed the Aquacel bandage.  This bandage will stay in place 7 days postoperatively.  Can be replaced with honeycomb bandages that will be sent home with the patient  Discussed sending  the patient home with tramadol and oxycodone for as needed pain management.  Patient has previously had issues taking tramadol, however states that she would like to try it, just reports having a headache last time she took it.  Patient has an allergy to NSAIDs, we will not send this patient home with Celebrex.  Patient will take an 81 mg aspirin twice daily for DVT prophylaxis  JP drain removed without difficulty, intact  Weight-Bearing as tolerated to right leg  Patient will follow-up with [facility] orthopedics in 2 weeks for staple removal and reevaluation  Rayburn Go, PA-C [facility] 04/07/2023, 7:46 AM

## 2023-04-07 NOTE — Evaluation (Signed)
Occupational Therapy Evaluation Patient Details Name: Annette [name] MRN: [id number] DOB: 1955/11/24 Today's Date: 04/07/2023   History of Present Illness 67 y/o female s/p R TKA on 04/06/23. PMH: anxiety, hx of L TKA, Grave's disease   Clinical Impression   Patient received for OT evaluation. See flowsheet below for details of function. Pt participated well in OT training on ADLs/IADLs s/p R TKA. Ready for d/c home once cleared by surgical team and PT.        If plan is discharge home, recommend the following: Assistance with cooking/housework;Assist for transportation    Functional Status Assessment   (all OT goals met in acute care)  Equipment Recommendations  BSC/3in1    Recommendations for Other Services       Precautions / Restrictions Precautions Precautions: Knee;Fall Precaution Booklet Issued: Yes (comment) Restrictions Weight Bearing Restrictions: No RLE Weight Bearing: Weight bearing as tolerated      Mobility Bed Mobility Overal bed mobility: Modified Independent                  Transfers Overall transfer level: Modified independent Equipment used: Rolling walker (2 wheels)                      Balance                                           ADL either performed or assessed with clinical judgement   ADL Overall ADL's : Needs assistance/impaired                 Upper Body Dressing : Set up   Lower Body Dressing: Set up Lower Body Dressing Details (indicate cue type and reason): OT brought bag of clothes to pt. She was able to don them with out assist while long sitting on EOB; good flexibility in hips. Did not don shoes, but pt able to touch toes and would be able to don slip-on shoes which he has at bedside.   Toilet Transfer Details (indicate cue type and reason): pt states she went to toilet earlier with other staff and no difficulty t/f on/off of toilet.   Toileting - Clothing Manipulation Details  (indicate cue type and reason): anticipate MOD (I)   Tub/Shower Transfer Details (indicate cue type and reason): pt  has walk-in shower and seat; anticipate no difficulty   General ADL Comments: Pt using RW for sit to stand t/f during LB dressing; no difficulty.     Vision Patient Visual Report: No change from baseline       Perception         Praxis         Pertinent Vitals/Pain Pain Assessment Pain Assessment: 0-10 Pain Score: 5  Pain Location: R knee Pain Descriptors / Indicators: Aching Pain Intervention(s): Limited activity within patient's tolerance, Monitored during session, Premedicated before session     Extremity/Trunk Assessment Upper Extremity Assessment Upper Extremity Assessment: Overall WFL for tasks assessed   Lower Extremity Assessment Lower Extremity Assessment: Defer to PT evaluation       Communication Communication Communication: No apparent difficulties   Cognition Arousal: Alert Behavior During Therapy: WFL for tasks assessed/performed Overall Cognitive Status: Within Functional Limits for tasks assessed  General Comments: oriented and pleasant     General Comments  On room air; no distress. States husband knows how to use polarice machine.    Exercises     Shoulder Instructions      Home Living Family/patient expects to be discharged to:: Private residence Living Arrangements: Spouse/significant other Available Help at Discharge: Family Type of Home: House Home Access: Stairs to enter Secretary/administrator of Steps: 3 Entrance Stairs-Rails: Right;Left;Can reach both Home Layout: Able to live on main level with bedroom/bathroom;Two level     Bathroom Shower/Tub: Producer, television/film/video: Standard     Home Equipment: TEFL teacher Comments: medical team ordering RW and BSC.      Prior Functioning/Environment Prior Level of Function : Independent/Modified  Independent             Mobility Comments: (I) ADLs Comments: (I)        OT Problem List:        OT Treatment/Interventions:      OT Goals(Current goals can be found in the care plan section) Acute Rehab OT Goals Patient Stated Goal: Go home  OT Frequency:      Co-evaluation              AM-PAC OT "6 Clicks" Daily Activity     Outcome Measure Help from another person eating meals?: None Help from another person taking care of personal grooming?: None Help from another person toileting, which includes using toliet, bedpan, or urinal?: None Help from another person bathing (including washing, rinsing, drying)?: None Help from another person to put on and taking off regular upper body clothing?: None Help from another person to put on and taking off regular lower body clothing?: None 6 Click Score: 24   End of Session Equipment Utilized During Treatment: Rolling walker (2 wheels) Nurse Communication: Other (comment) (pt ready for d/c)  Activity Tolerance: Patient tolerated treatment well Patient left: in bed;with call bell/phone within reach                   Time: 1610-9604 OT Time Calculation (min): 15 min Charges:  OT General Charges $OT Visit: 1 Visit OT Evaluation $OT Eval Moderate Complexity: 1 Mod   Junie Panning, MS, OTR/L  Alvester Morin 04/07/2023, 10:10 AM

## 2023-04-07 NOTE — Evaluation (Signed)
Physical Therapy Evaluation Patient Details Name: Annette [name] MRN: [id number] DOB: September 10, 1955 Today's Date: 04/07/2023  History of Present Illness  67 y/o female s/p R TKA on 04/06/23. PMH: anxiety, hx of L TKA, Grave's disease  Clinical Impression  Patient admitted following the above procedure. PTA, patient lives with husband and reports independence with no AD. Patient currently presents with decreased R knee ROM, weakness, and decreased activity tolerance. Patient able to complete bed mobility and sit to stand modI. Ambulated 200' with supervision and RW. Negotiated 4 stairs with B handrails and supervision. Able to flex R knee to 85 degrees. Husband will be able to assist patient at home. HEP handout provided and instructed on technique and frequency of exercises. Patient will benefit from skilled PT services during acute stay to address listed deficits. Patient will benefit from ongoing therapy at discharge to maximize functional independence and safety.         If plan is discharge home, recommend the following: A little help with bathing/dressing/bathroom;Assistance with cooking/housework;Assist for transportation;Help with stairs or ramp for entrance   Can travel by private vehicle        Equipment Recommendations Rolling  (2 wheels);BSC/3in1  Recommendations for Other Services       Functional Status Assessment Patient has had a recent decline in their functional status and demonstrates the ability to make significant improvements in function in a reasonable and predictable amount of time.     Precautions / Restrictions Precautions Precautions: Knee;Fall Precaution Booklet Issued: Yes (comment) Restrictions Weight Bearing Restrictions: Yes RLE Weight Bearing: Weight bearing as tolerated      Mobility  Bed Mobility Overal bed mobility: Modified Independent                  Transfers Overall transfer level: Modified independent Equipment used: Rolling   (2 wheels)                    Ambulation/Gait Ambulation/Gait assistance: Supervision Gait Distance (Feet): 200 Feet Assistive device: Rolling  (2 wheels) Gait Pattern/deviations: Step-to pattern, Decreased stride length Gait velocity: decreased     General Gait Details: step to gait pattern. Cues for heel strike on R to encourage knee extension  Stairs Stairs: Yes Stairs assistance: Supervision Stair Management: Two rails, Step to pattern, Forwards Number of Stairs: 4 General stair comments: instructed on stair negotiation with up with good leg and down with bad leg. Good follow through  Wheelchair Mobility     Tilt Bed    Modified Rankin (Stroke Patients Only)       Balance Overall balance assessment: Mild deficits observed, not formally tested                                           Pertinent Vitals/Pain Pain Assessment Pain Assessment: Faces Faces Pain Scale: Hurts even more Pain Location: R knee Pain Descriptors / Indicators: Sore Pain Intervention(s): Monitored during session, Premedicated before session    Home Living Family/patient expects to be discharged to:: Private residence Living Arrangements: Spouse/significant other Available Help at Discharge: Family Type of Home: House Home Access: Stairs to enter Entrance Stairs-Rails: Right;Left;Can reach both Secretary/administrator of Steps: 3   Home Layout: Two level (bedroom and bathroom on main level) Home Equipment: Shower seat Additional Comments: medical team ordering RW and BSC.    Prior Function Prior Level of Function :  Independent/Modified Independent             Mobility Comments: (I) ADLs Comments: (I)     Extremity/Trunk Assessment   Upper Extremity Assessment Upper Extremity Assessment: Defer to OT evaluation    Lower Extremity Assessment Lower Extremity Assessment: RLE deficits/detail RLE Deficits / Details: grossly 3+/5, R knee  flexion 85 degrees and R knee extension -3 degrees    Cervical / Trunk Assessment Cervical / Trunk Assessment: Normal  Communication   Communication Communication: No apparent difficulties  Cognition Arousal: Alert Behavior During Therapy: WFL for tasks assessed/performed Overall Cognitive Status: Within Functional Limits for tasks assessed                                          General Comments General comments (skin integrity, edema, etc.): On room air; no distress. States husband knows how to use polarice machine.    Exercises Other Exercises Other Exercises: HEP handout provided and instructed patient on techinque and frequency of exercises   Assessment/Plan    PT Assessment Patient needs continued PT services  PT Problem List Decreased strength;Decreased range of motion;Decreased activity tolerance;Decreased balance;Decreased mobility;Decreased knowledge of use of DME;Decreased knowledge of precautions       PT Treatment Interventions DME instruction;Gait training;Functional mobility training;Stair training;Therapeutic activities;Therapeutic exercise;Balance training;Patient/family education    PT Goals (Current goals can be found in the Care Plan section)  Acute Rehab PT Goals Patient Stated Goal: to go home PT Goal Formulation: With patient Time For Goal Achievement: 04/21/23 Potential to Achieve Goals: Good    Frequency BID     Co-evaluation               AM-PAC PT "6 Clicks" Mobility  Outcome Measure Help needed turning from your back to your side while in a flat bed without using bedrails?: None Help needed moving from lying on your back to sitting on the side of a flat bed without using bedrails?: None Help needed moving to and from a bed to a chair (including a wheelchair)?: None Help needed standing up from a chair using your arms (e.g., wheelchair or bedside chair)?: None Help needed to walk in hospital room?: A Little Help  needed climbing 3-5 steps with a railing? : A Little 6 Click Score: 22    End of Session   Activity Tolerance: Patient tolerated treatment well Patient left: in bed;with call bell/phone within reach Nurse Communication: Mobility status PT Visit Diagnosis: Unsteadiness on feet (R26.81);Muscle weakness (generalized) (M62.81);Other abnormalities of gait and mobility (R26.89)    Time: 4098-1191 PT Time Calculation (min) (ACUTE ONLY): 24 min   Charges:   PT Evaluation $PT Eval Low Complexity: 1 Low PT Treatments $Gait Training: 8-22 mins PT General Charges $$ ACUTE PT VISIT: 1 Visit         Maylon Peppers, PT, DPT Physical Therapist - [facility]  [facility]    A  04/07/2023, 11:02 AM

## 2023-04-07 NOTE — TOC Progression Note (Signed)
Transition of Care [facility] - Progression Note    Patient Details  Name: LAYCEE NUMBERS MRN: [id number] Date of Birth: 1955-12-05  [facility]) CM/SW Contact  Marlowe Sax, RN Phone Number: 04/07/2023, 8:53 AM  Clinical Narrative:     The patient is [facility] up with [facility] prior to surgery by Surgeons office, [employer] to deliver 3 in 1 and RW to the bedside prior to DC  Expected Discharge Plan: Home w Home Health Services Barriers to Discharge: No Barriers Identified  Expected Discharge Plan and Services       Living arrangements for the past 2 months: Single Family Home Expected Discharge Date: 04/07/23               DME Arranged: Dan Humphreys rolling, 3-N-1 DME Agency: [employer]       HH Arranged: PT HH Agency: [facility] Date HH Agency Contacted: 04/07/23 Time HH Agency Contacted: [phone] Representative spoke with at [facility] Agency: [place]   Social Determinants of Health (SDOH) Interventions SDOH Screenings   Food Insecurity: No Food Insecurity (04/06/2023)  Housing: Low Risk  (04/06/2023)  Transportation Needs: No Transportation Needs (04/06/2023)  Utilities: Not At Risk (04/06/2023)  Financial Resource Strain: Low Risk  (02/03/2023)   Received from [facility]  Tobacco Use: Medium Risk (04/06/2023)    Readmission Risk Interventions     No data to display

## 2023-04-22 NOTE — Anesthesia Postprocedure Evaluation (Addendum)
Anesthesia Post Note  Patient: Annette [name]  Procedure(s) Performed: COMPUTER ASSISTED TOTAL KNEE ARTHROPLASTY (Right: Knee)  Anesthesia Type: Spinal Pain management: pain level controlled Vital Signs Assessment: post-procedure vital signs reviewed and stable Cardiovascular status: blood pressure returned to baseline Anesthetic complications: no Comments: Patient was discharged prior to being seen by the anesthesia post op team, but notes/vitals were reviewed and she was doing well at discharge without any complaints.  Moving well with PT.   No notable events documented.   Last Vitals:  Vitals:   04/07/23 0720 04/07/23 1132  BP: (!) 107/49 121/68  Pulse: 63 (!) 58  Resp: 16 14  Temp: 36.6 C 36.6 C  SpO2: 97% 96%    Last Pain:  Vitals:   04/07/23 1211  TempSrc:   PainSc: 0-No pain                 Lenard Simmer

## 2023-07-07 ENCOUNTER — Ambulatory Visit (INDEPENDENT_AMBULATORY_CARE_PROVIDER_SITE_OTHER): Payer: PRIVATE HEALTH INSURANCE | Admitting: Vascular Surgery

## 2023-07-07 ENCOUNTER — Encounter (INDEPENDENT_AMBULATORY_CARE_PROVIDER_SITE_OTHER): Payer: Medicare Other

## 2023-07-20 ENCOUNTER — Other Ambulatory Visit (INDEPENDENT_AMBULATORY_CARE_PROVIDER_SITE_OTHER): Payer: Self-pay | Admitting: Vascular Surgery

## 2023-07-20 DIAGNOSIS — I6523 Occlusion and stenosis of bilateral carotid arteries: Secondary | ICD-10-CM

## 2023-07-22 ENCOUNTER — Ambulatory Visit: Payer: Medicare Other

## 2023-07-22 ENCOUNTER — Ambulatory Visit
Admission: EM | Admit: 2023-07-22 | Discharge: 2023-07-22 | Disposition: A | Discharge status: Home / Self Care | Payer: Medicare Other | Attending: Emergency Medicine | Admitting: Emergency Medicine

## 2023-07-22 DIAGNOSIS — J0100 Acute maxillary sinusitis, unspecified: Secondary | ICD-10-CM | POA: Diagnosis not present

## 2023-07-22 DIAGNOSIS — R051 Acute cough: Secondary | ICD-10-CM

## 2023-07-22 MED ORDER — FLUTICASONE PROPIONATE 50 MCG/ACT NA SUSP: 2.0000 | Freq: Every day | NASAL | 0 refills | Status: AC

## 2023-07-22 MED ORDER — AEROCHAMBER MV MISC: 1 refills | Status: AC

## 2023-07-22 MED ORDER — ALBUTEROL SULFATE HFA 108 (90 BASE) MCG/ACT IN AERS: 1.0000 | INHALATION_SPRAY | RESPIRATORY_TRACT | 0 refills | Status: AC | PRN

## 2023-07-22 MED ORDER — PROMETHAZINE-DM 6.25-15 MG/5ML PO SYRP: 5.0000 mL | ORAL_SOLUTION | Freq: Four times a day (QID) | ORAL | 0 refills | Status: AC | PRN

## 2023-07-22 MED ORDER — DOXYCYCLINE HYCLATE 100 MG PO CAPS: 100.0000 mg | ORAL_CAPSULE | Freq: Two times a day (BID) | ORAL | 0 refills | Status: AC

## 2023-07-22 NOTE — ED Provider Notes (Signed)
HPI  SUBJECTIVE:  Annette [name] is a 67 y.o. female who presents with 6 days of headaches, body aches, cough productive of yellow sputum, malaise, fatigue, sinus pain and pressure, nasal congestion, clear rhinorrhea, postnasal drip, sore throat, wheezing.  She is waking up coughing for hours starting this morning.  She had a negative home COVID test today.  No fevers, facial swelling, upper dental pain, shortness of breath, dyspnea on exertion, double sickening.  Patient states that she is getting worse.  Her husband had similar symptoms last week, and is currently on antibiotics.  No antibiotics in the past 3 months.  No antipyretic in the past 6 hours.  She tried Mucinex DM once without improvement in her symptoms.  No aggravating factors. Patient has a past medical history of carotid artery stenosis, GERD, migraines, hyperlipidemia, hypothyroidism.  No history of hypertension, pulmonary disease, smoking.  She is allergic to ibuprofen and penicillin.  [facility]  Past Medical History:  Diagnosis Date   Anemia    Jan slightly anemic   Anxiety    Arthritis    FINGERS, knees   Carotid artery stenosis    Cataract    Complication of anesthesia    nausea, headaches, achy legs   Family history of adverse reaction to anesthesia    SISTERS BOTH GET NAUSEATED   GERD (gastroesophageal reflux disease)    Headache 05/2017   migraines - precipitated by extended use of celebrex/meloxicam   History of 2019 novel coronavirus disease (COVID-19) 07/11/2020   Hyperlipidemia    Hypothyroidism 04/2009   s/p ablation/ GRAVES DISEASE   Neuromuscular disorder (HCC)    NUMBNESS IN HANDS OR LIPS OCCASIONALLY/ carpal tunnel   PONV (postoperative nausea and vomiting)    AFTER 11-29-20 SURGERY   Pre-diabetes    Psoriasis     Past Surgical History:  Procedure Laterality Date   ABDOMINAL HYSTERECTOMY  03/2007   ACHILLES TENDON SURGERY Right 10/25/2021   Procedure: ACHILLES TENDON REPAIR -  SECONDARY;  Surgeon: Gwyneth Revels, DPM;  Location: [facility];  Service: Podiatry;  Laterality: Right;   BREAST BIOPSY Right 09/18/2022   Stereo bx, Calcs, X-clip, path pending   BREAST BIOPSY Right 09/18/2022   MM RT BREAST BX W LOC DEV 1ST LESION IMAGE BX SPEC STEREO GUIDE 09/18/2022 [facility]   CATARACT EXTRACTION W/PHACO Left 06/16/2018   Procedure: CATARACT EXTRACTION PHACO AND INTRAOCULAR LENS PLACEMENT (IOC) LEFT;  Surgeon: Lockie Mola, MD;  Location: [facility];  Service: Ophthalmology;  Laterality: Left;   CATARACT EXTRACTION W/PHACO Right 07/27/2019   Procedure: CATARACT EXTRACTION PHACO AND INTRAOCULAR LENS PLACEMENT (IOC) RIGHT TECNIS ADD 6.17, 00:56.9, 27.1%;  Surgeon: Lockie Mola, MD;  Location: [facility];  Service: Ophthalmology;  Laterality: Right;   CESAREAN SECTION     x2   COLONOSCOPY WITH PROPOFOL N/A 10/20/2016   Procedure: COLONOSCOPY WITH PROPOFOL;  Surgeon: Midge Minium, MD;  Location: [facility];  Service: Endoscopy;  Laterality: N/A;   COLONOSCOPY WITH PROPOFOL N/A 01/11/2018   Procedure: COLONOSCOPY WITH PROPOFOL;  Surgeon: Midge Minium, MD;  [facility];  Service: Endoscopy;  Laterality: N/A;   COLONOSCOPY WITH PROPOFOL N/A 07/11/2022   Procedure: COLONOSCOPY WITH BIOPSY;  Surgeon: Midge Minium, MD;  Location: [facility];  Service: Endoscopy;  Laterality: N/A;   DILATION AND CURETTAGE OF UTERUS     ENDARTERECTOMY Right 06/24/2017   Procedure: ENDARTERECTOMY CAROTID;  Surgeon: Annice Needy, MD;  Location: [facility];  Service: Vascular;  Laterality: Right;   KNEE ARTHROPLASTY Right 04/06/2023   Procedure: COMPUTER ASSISTED TOTAL KNEE ARTHROPLASTY;  Surgeon: Donato Heinz, MD;  Location: [facility];  Service: Orthopedics;  Laterality: Right;   KNEE ARTHROSCOPY Left 09/14/2015   Procedure: ARTHROSCOPY KNEE;  Surgeon: Erin Sons, MD;  Location: [facility];  Service: Orthopedics;   Laterality: Left;   KNEE ARTHROSCOPY Left 01/15/2021   Procedure: Left knee arthroscopy for arthrofibrosis, extensive synovectomy;  Surgeon: Kennedy Bucker, MD;  Location: [facility];  Service: Orthopedics;  Laterality: Left;   KNEE ARTHROSCOPY W/ PARTIAL MEDIAL MENISCECTOMY Right 05/12/2014   KNEE CLOSED REDUCTION Left 01/01/2021   Procedure: CLOSED MANIPULATION KNEE;  Surgeon: Kennedy Bucker, MD;  Location: [facility];  Service: Orthopedics;  Laterality: Left;   KNEE CLOSED REDUCTION Left 04/30/2021   Procedure: CLOSED MANIPULATION KNEE;  Surgeon: Kennedy Bucker, MD;  Location: [facility];  Service: Orthopedics;  Laterality: Left;   OSTECTOMY Right 10/25/2021   Procedure: CALCANEAL EXOSTECTOMY;  Surgeon: Gwyneth Revels, DPM;  Location: [facility];  Service: Podiatry;  Laterality: Right;   POLYPECTOMY  10/20/2016   Procedure: POLYPECTOMY;  Surgeon: Midge Minium, MD;  Location: [facility];  Service: Endoscopy;;   POLYPECTOMY  01/11/2018   Procedure: POLYPECTOMY;  Surgeon: Midge Minium, MD;  Location: [facility];  Service: Endoscopy;;   POLYPECTOMY  07/11/2022   Procedure: POLYPECTOMY;  Surgeon: Midge Minium, MD;  Location: [facility];  Service: Endoscopy;;   THYROID SURGERY  2010   ablation   TOTAL KNEE ARTHROPLASTY Left 11/29/2020   Procedure: Left total knee arthroplasty - [place] Neighbor to Assist;  Surgeon: Kennedy Bucker, MD;  Location: [facility];  Service: Orthopedics;  Laterality: Left;   TOTAL KNEE REVISION Left 03/12/2021   Procedure: Revision of femoral component of total knee, left - RNFA;  Surgeon: Kennedy Bucker, MD;  Location: [facility];  Service: Orthopedics;  Laterality: Left;   TUBAL LIGATION      Family History  Problem Relation Age of Onset   Breast cancer Paternal Grandmother 18   Breast cancer Maternal Grandmother 44   Breast cancer Sister 68    Social History   Tobacco Use   Smoking status: Former    Current packs/day: 0.00    Average packs/day: 1  pack/day for 12.0 years (12.0 ttl pk-yrs)    Types: Cigarettes    Start date: 06/25/1973    Quit date: 06/25/1985    Years since quitting: 38.1   Smokeless tobacco: Never  Vaping Use   Vaping status: Never Used  Substance Use Topics   Alcohol use: Yes    Comment: rare wine   Drug use: No    No current facility-administered medications for this encounter.  Current Outpatient Medications:    albuterol (VENTOLIN HFA) 108 (90 Base) MCG/ACT inhaler, Inhale 1-2 puffs into the lungs every 4 (four) hours as needed for wheezing or shortness of breath., Disp: 1 each, Rfl: 0   ALPRAZolam (XANAX) 0.25 MG tablet, Take 0.25 mg by mouth at bedtime., Disp: , Rfl:    aspirin EC 81 MG tablet, Take 1 tablet (81 mg total) by mouth in the morning and at bedtime. Swallow whole., Disp: , Rfl:    atorvastatin (LIPITOR) 80 MG tablet, Take 80 mg by mouth at bedtime., Disp: , Rfl:    Calcium Carb-Cholecalciferol (CALCIUM 600 + D PO), Take 1 tablet by mouth daily., Disp: , Rfl:    Cholecalciferol (VITAMIN D3) 50 MCG (2000 UT) TABS, Take 1 tablet by mouth  daily., Disp: , Rfl:    citalopram (CELEXA) 20 MG tablet, Take 10 mg by mouth every morning. am, Disp: , Rfl:    doxycycline (VIBRAMYCIN) 100 MG capsule, Take 1 capsule (100 mg total) by mouth 2 (two) times daily for 10 days., Disp: 20 capsule, Rfl: 0   fluticasone (FLONASE) 50 MCG/ACT nasal spray, Place 2 sprays into both nostrils daily., Disp: 16 g, Rfl: 0   levothyroxine (SYNTHROID, LEVOTHROID) 50 MCG tablet, Take 50 mcg by mouth daily before breakfast., Disp: , Rfl:    promethazine-dextromethorphan (PROMETHAZINE-DM) 6.25-15 MG/5ML syrup, Take 5 mLs by mouth 4 (four) times daily as needed for cough., Disp: 118 mL, Rfl: 0   Spacer/Aero-Holding Chambers (AEROCHAMBER MV) inhaler, Use as instructed, Disp: 1 each, Rfl: 1   acetaminophen (TYLENOL) 500 MG tablet, Take 1,000 mg by mouth every 6 (six) hours as needed., Disp: , Rfl:    alum & mag hydroxide-simeth  (MAALOX/MYLANTA) 200-200-20 MG/5ML suspension, Take 15 mLs by mouth at bedtime as needed for indigestion or heartburn., Disp: , Rfl:    clobetasol ointment (TEMOVATE) 0.05 %, Apply 1 application topically 2 (two) times daily as needed (psoriasis)., Disp: , Rfl:    oxyCODONE (OXY IR/ROXICODONE) 5 MG immediate release tablet, Take 1 tablet (5 mg total) by mouth every 4 (four) hours as needed for moderate pain (pain score 4-6)., Disp: 30 tablet, Rfl: 0   Polyethyl Glycol-Propyl Glycol (SYSTANE OP), Place 1 drop into both eyes daily as needed (dry eyes)., Disp: , Rfl:    traMADol (ULTRAM) 50 MG tablet, Take 1 tablet (50 mg total) by mouth every 6 (six) hours as needed., Disp: 20 tablet, Rfl: 0  Allergies  Allergen Reactions   Penicillins Swelling and Rash    IgE = 9 (WNL) on 03/31/2023   Celebrex [Celecoxib] Nausea Only and Other (See Comments)    Precipitates migraines with extended use    Doxycycline     Headache   Tramadol Other (See Comments)    headaches   Advil [Ibuprofen] Rash    Facial swelling   Clindamycin/Lincomycin Nausea And Vomiting    headaches   Meloxicam     Causes headaches when taken for an extended period      ROS  As noted in HPI.   Physical Exam  BP (!) 151/76 (BP Location: Right Arm)   Pulse 62   Temp 99 F (37.2 C) (Oral)   Resp 19   SpO2 98%   Constitutional: Well developed, well nourished, no acute distress Eyes:  EOMI, conjunctiva normal bilaterally HENT: Normocephalic, atraumatic,mucus membranes moist.  Erythematous, swollen turbinates.  Positive maxillary sinus tenderness.  Mucoid nasal congestion.  Positive postnasal drip. Respiratory: Normal inspiratory effort, lungs clear bilaterally.  No chest wall tenderness Cardiovascular: Normal rate, regular rhythm, no murmurs rubs or gallops. GI: nondistended skin: No rash, skin intact Musculoskeletal: no deformities Neurologic: Alert & oriented x 3, no focal neuro deficits Psychiatric: Speech and  behavior appropriate   ED Course   Medications - No data to display  Orders Placed This Encounter  Procedures   DG Chest 2 View    Standing Status:   Standing    Number of Occurrences:   1    Order Specific Question:   Reason for Exam (SYMPTOM  OR DIAGNOSIS REQUIRED)    Answer:   Productive cough for 6 days rule out pneumonia    No results found for this or any previous visit (from the past 24 hour(s)). DG Chest 2 View  Result Date: 07/22/2023 CLINICAL DATA:  Productive cough for 6 days. EXAM: CHEST - 2 VIEW COMPARISON:  April 01, 2007. FINDINGS: The heart size and mediastinal contours are within normal limits. Both lungs are clear. The visualized skeletal structures are unremarkable. IMPRESSION: No active cardiopulmonary disease. Electronically Signed   By: Lupita Raider M.D.   On: 07/22/2023 13:39    ED Clinical Impression  1. Acute non-recurrent maxillary sinusitis   2. Acute cough      ED Assessment/Plan      Patient presents with a sinusitis and a cough, most likely from postnasal drip.  Will check a chest x-ray to rule out pneumonia.  Reviewed imaging independently.  No pneumonia as read by me.  Discussed with patient that we will contact her if radiology overread differs enough from mine and we need to change management.  Reviewed radiology report.  No pneumonia.  See radiology report for full details.  Sent patient MyChart note notifying her of negative x-ray result.  Patient has no pneumonia on x-ray.  Home with Promethazine DM, saline nasal irrigation, regularly scheduled albuterol inhaler with spacer, Mucinex D and a wait-and-see prescription of doxycycline for 10 days.  She does not yet meet ISDA criteria for antibiotics to treat a sinus infection yet.  This will cover sinusitis and any possible pneumonia not showing up on x-ray.  She will start this if she is not better in several days, or if she gets worse in the interim. Follow-up with PCP as  needed.  Discussed imaging, MDM, treatment plan, and plan for follow-up with patient.patient agrees with plan.   Meds ordered this encounter  Medications   doxycycline (VIBRAMYCIN) 100 MG capsule    Sig: Take 1 capsule (100 mg total) by mouth 2 (two) times daily for 10 days.    Dispense:  20 capsule    Refill:  0   fluticasone (FLONASE) 50 MCG/ACT nasal spray    Sig: Place 2 sprays into both nostrils daily.    Dispense:  16 g    Refill:  0   promethazine-dextromethorphan (PROMETHAZINE-DM) 6.25-15 MG/5ML syrup    Sig: Take 5 mLs by mouth 4 (four) times daily as needed for cough.    Dispense:  118 mL    Refill:  0   Spacer/Aero-Holding Chambers (AEROCHAMBER MV) inhaler    Sig: Use as instructed    Dispense:  1 each    Refill:  1   albuterol (VENTOLIN HFA) 108 (90 Base) MCG/ACT inhaler    Sig: Inhale 1-2 puffs into the lungs every 4 (four) hours as needed for wheezing or shortness of breath.    Dispense:  1 each    Refill:  0      *This clinic note was created using Scientist, clinical (histocompatibility and immunogenetics). Therefore, there may be occasional mistakes despite careful proofreading.  ?    Domenick Gong, MD 07/23/23 [phone]

## 2023-07-22 NOTE — ED Triage Notes (Addendum)
Cough x 6 days. Gotten worse 2-3 days  Congestion-bodyaches-fatigue started Friday. No fever. Sore throat   Patient took covid test at home and was negative this morning.

## 2023-07-22 NOTE — Discharge Instructions (Addendum)
I did not appreciate any obvious pneumonia on your x-ray, but the radiology overread is pending.  We will contact you if they see something different.  I am sending you home with a wait-and-see prescription of doxycycline for 10 days, which will take care of a sinus infection.  I would wait a few more days to start this, less there is a pneumonia on your x-ray.  If there is a pneumonia on your x-ray, then go ahead and started today.  Promethazine DM, saline nasal irrigation with a [employer]Med sinus rinse and distilled water as often as you want, Mucinex D.  2 puffs from your albuterol inhaler using your spacer every 4 hours for 2 days, then every 6 hours for 2 days, then as needed.  You can back off on the albuterol if you start to improve sooner.

## 2023-07-28 ENCOUNTER — Encounter (INDEPENDENT_AMBULATORY_CARE_PROVIDER_SITE_OTHER): Payer: Medicare Other

## 2023-07-28 ENCOUNTER — Ambulatory Visit (INDEPENDENT_AMBULATORY_CARE_PROVIDER_SITE_OTHER): Payer: PRIVATE HEALTH INSURANCE | Admitting: Vascular Surgery

## 2023-09-25 ENCOUNTER — Encounter (INDEPENDENT_AMBULATORY_CARE_PROVIDER_SITE_OTHER): Payer: Medicare Other

## 2023-09-25 ENCOUNTER — Ambulatory Visit (INDEPENDENT_AMBULATORY_CARE_PROVIDER_SITE_OTHER): Payer: PRIVATE HEALTH INSURANCE | Admitting: Vascular Surgery

## 2023-11-10 ENCOUNTER — Ambulatory Visit (INDEPENDENT_AMBULATORY_CARE_PROVIDER_SITE_OTHER): Payer: PRIVATE HEALTH INSURANCE | Admitting: Vascular Surgery

## 2023-11-10 ENCOUNTER — Encounter (INDEPENDENT_AMBULATORY_CARE_PROVIDER_SITE_OTHER): Payer: Medicare Other

## 2024-01-05 ENCOUNTER — Encounter (INDEPENDENT_AMBULATORY_CARE_PROVIDER_SITE_OTHER): Payer: Self-pay | Admitting: Vascular Surgery

## 2024-01-05 ENCOUNTER — Ambulatory Visit (INDEPENDENT_AMBULATORY_CARE_PROVIDER_SITE_OTHER)

## 2024-01-05 ENCOUNTER — Ambulatory Visit (INDEPENDENT_AMBULATORY_CARE_PROVIDER_SITE_OTHER): Payer: PRIVATE HEALTH INSURANCE | Admitting: Vascular Surgery

## 2024-01-05 VITALS — BP 168/74 | HR 57 | Resp 18 | Ht 60.0 in | Wt 173.4 lb

## 2024-01-05 DIAGNOSIS — I6523 Occlusion and stenosis of bilateral carotid arteries: Secondary | ICD-10-CM | POA: Diagnosis not present

## 2024-01-05 DIAGNOSIS — E785 Hyperlipidemia, unspecified: Secondary | ICD-10-CM | POA: Diagnosis not present

## 2024-01-05 DIAGNOSIS — H93A1 Pulsatile tinnitus, right ear: Secondary | ICD-10-CM

## 2024-01-05 NOTE — Progress Notes (Signed)
 MRN : [id number]  Annette [name] is a 68 y.o. (01/16/1956) female who presents with chief complaint of  Chief Complaint  Patient presents with   Follow-up    1 year carotid.  Aaron Aas  History of Present Illness: Patient returns today in follow up of her carotid disease.  She is about 6 or 7 years status post right carotid endarterectomy at this point.  She is doing well.  She denies any focal neurologic symptoms. Specifically, the patient denies amaurosis fugax, speech or swallowing difficulties, or arm or leg weakness or numbness.  Her duplex today demonstrates a patent right carotid endarterectomy without significant recurrent stenosis and stable 1 to 39% left ICA stenosis.  Current Outpatient Medications  Medication Sig Dispense Refill   ALPRAZolam  (XANAX ) 0.25 MG tablet Take 0.25 mg by mouth at bedtime.     alum & mag hydroxide-simeth (MAALOX/MYLANTA) 200-200-20 MG/5ML suspension Take 15 mLs by mouth at bedtime as needed for indigestion or heartburn.     aspirin  EC 81 MG tablet Take 1 tablet (81 mg total) by mouth in the morning and at bedtime. Swallow whole.     atorvastatin  (LIPITOR) 80 MG tablet Take 80 mg by mouth at bedtime.     Calcium  Carb-Cholecalciferol (CALCIUM  600 + D PO) Take 1 tablet by mouth daily.     Cholecalciferol (VITAMIN D3) 50 MCG (2000 UT) TABS Take 1 tablet by mouth daily.     citalopram  (CELEXA ) 20 MG tablet Take 10 mg by mouth every morning. am     clobetasol  ointment (TEMOVATE ) 0.05 % Apply 1 application topically 2 (two) times daily as needed (psoriasis).     levothyroxine  (SYNTHROID , LEVOTHROID) 50 MCG tablet Take 50 mcg by mouth daily before breakfast.     Polyethyl Glycol-Propyl Glycol (SYSTANE OP) Place 1 drop into both eyes daily as needed (dry eyes).     acetaminophen  (TYLENOL ) 500 MG tablet Take 1,000 mg by mouth every 6 (six) hours as needed.     albuterol  (VENTOLIN  HFA) 108 (90 Base) MCG/ACT inhaler Inhale 1-2 puffs into the lungs every 4 (four) hours  as needed for wheezing or shortness of breath. 1 each 0   fluticasone  (FLONASE ) 50 MCG/ACT nasal spray Place 2 sprays into both nostrils daily. 16 g 0   oxyCODONE  (OXY IR/ROXICODONE ) 5 MG immediate release tablet Take 1 tablet (5 mg total) by mouth every 4 (four) hours as needed for moderate pain (pain score 4-6). 30 tablet 0   promethazine -dextromethorphan (PROMETHAZINE -DM) 6.25-15 MG/5ML syrup Take 5 mLs by mouth 4 (four) times daily as needed for cough. 118 mL 0   Spacer/Aero-Holding Chambers (AEROCHAMBER MV) inhaler Use as instructed 1 each 1   traMADol  (ULTRAM ) 50 MG tablet Take 1 tablet (50 mg total) by mouth every 6 (six) hours as needed. 20 tablet 0   No current facility-administered medications for this visit.    Past Medical History:  Diagnosis Date   Anemia    Jan slightly anemic   Anxiety    Arthritis    FINGERS, knees   Carotid artery stenosis    Cataract    Complication of anesthesia    nausea, headaches, achy legs   Family history of adverse reaction to anesthesia    SISTERS BOTH GET NAUSEATED   GERD (gastroesophageal reflux disease)    Headache 05/2017   migraines - precipitated by extended use of celebrex/meloxicam   History of 2019 novel coronavirus disease (COVID-19) 07/11/2020   Hyperlipidemia    Hypothyroidism  04/2009   s/p ablation/ GRAVES DISEASE   Neuromuscular disorder (HCC)    NUMBNESS IN HANDS OR LIPS OCCASIONALLY/ carpal tunnel   PONV (postoperative nausea and vomiting)    AFTER 11-29-20 SURGERY   Pre-diabetes    Psoriasis     Past Surgical History:  Procedure Laterality Date   ABDOMINAL HYSTERECTOMY  03/2007   ACHILLES TENDON SURGERY Right 10/25/2021   Procedure: ACHILLES TENDON REPAIR - SECONDARY;  Surgeon: Anell Baptist, DPM;  Location: [facility];  Service: Podiatry;  Laterality: Right;   BREAST BIOPSY Right 09/18/2022   Stereo bx, Calcs, X-clip, path pending   BREAST BIOPSY Right 09/18/2022   MM RT BREAST BX W LOC DEV 1ST LESION IMAGE BX  SPEC STEREO GUIDE 09/18/2022 [facility]   CATARACT EXTRACTION W/PHACO Left 06/16/2018   Procedure: CATARACT EXTRACTION PHACO AND INTRAOCULAR LENS PLACEMENT (IOC) LEFT;  Surgeon: Annell Kidney, MD;  Location: [facility];  Service: Ophthalmology;  Laterality: Left;   CATARACT EXTRACTION W/PHACO Right 07/27/2019   Procedure: CATARACT EXTRACTION PHACO AND INTRAOCULAR LENS PLACEMENT (IOC) RIGHT TECNIS ADD 6.17, 00:56.9, 27.1%;  Surgeon: Annell Kidney, MD;  Location: [facility];  Service: Ophthalmology;  Laterality: Right;   CESAREAN SECTION     x2   COLONOSCOPY WITH PROPOFOL  N/A 10/20/2016   Procedure: COLONOSCOPY WITH PROPOFOL ;  Surgeon: Marnee Sink, MD;  Location: [facility];  Service: Endoscopy;  Laterality: N/A;   COLONOSCOPY WITH PROPOFOL  N/A 01/11/2018   Procedure: COLONOSCOPY WITH PROPOFOL ;  Surgeon: Marnee Sink, MD;  Location: [facility];  Service: Endoscopy;  Laterality: N/A;   COLONOSCOPY WITH PROPOFOL  N/A 07/11/2022   Procedure: COLONOSCOPY WITH BIOPSY;  Surgeon: Marnee Sink, MD;  Location: [facility];  Service: Endoscopy;  Laterality: N/A;   DILATION AND CURETTAGE OF UTERUS     ENDARTERECTOMY Right 06/24/2017   Procedure: ENDARTERECTOMY CAROTID;  Surgeon: Celso College, MD;  Location: [facility];  Service: Vascular;  Laterality: Right;   KNEE ARTHROPLASTY Right 04/06/2023   Procedure: COMPUTER ASSISTED TOTAL KNEE ARTHROPLASTY;  Surgeon: Arlyne Lame, MD;  Location: [facility];  Service: Orthopedics;  Laterality: Right;   KNEE ARTHROSCOPY Left 09/14/2015   Procedure: ARTHROSCOPY KNEE;  Surgeon: Josephus Nida, MD;  Location: [facility];  Service: Orthopedics;  Laterality: Left;   KNEE ARTHROSCOPY Left 01/15/2021   Procedure: Left knee arthroscopy for arthrofibrosis, extensive synovectomy;  Surgeon: Molli Angelucci, MD;  Location: [facility];  Service: Orthopedics;  Laterality: Left;   KNEE ARTHROSCOPY W/ PARTIAL MEDIAL  MENISCECTOMY Right 05/12/2014   KNEE CLOSED REDUCTION Left 01/01/2021   Procedure: CLOSED MANIPULATION KNEE;  Surgeon: Molli Angelucci, MD;  Location: [facility];  Service: Orthopedics;  Laterality: Left;   KNEE CLOSED REDUCTION Left 04/30/2021   Procedure: CLOSED MANIPULATION KNEE;  Surgeon: Molli Angelucci, MD;  Location: [facility];  Service: Orthopedics;  Laterality: Left;   OSTECTOMY Right 10/25/2021   Procedure: CALCANEAL EXOSTECTOMY;  Surgeon: Anell Baptist, DPM;  Location: [facility];  Service: Podiatry;  Laterality: Right;   POLYPECTOMY  10/20/2016   Procedure: POLYPECTOMY;  Surgeon: Marnee Sink, MD;  Location: [facility];  Service: Endoscopy;;   POLYPECTOMY  01/11/2018   Procedure: POLYPECTOMY;  Surgeon: Marnee Sink, MD;  Location: [facility];  Service: Endoscopy;;   POLYPECTOMY  07/11/2022   Procedure: POLYPECTOMY;  Surgeon: Marnee Sink, MD;  Location: [facility];  Service: Endoscopy;;   THYROID  SURGERY  2010   ablation   TOTAL KNEE ARTHROPLASTY Left 11/29/2020   Procedure:  Left total knee arthroplasty - Thomos Flies to Assist;  Surgeon: Molli Angelucci, MD;  Location: [facility];  Service: Orthopedics;  Laterality: Left;   TOTAL KNEE REVISION Left 03/12/2021   Procedure: Revision of femoral component of total knee, left - RNFA;  Surgeon: Molli Angelucci, MD;  Location: [facility];  Service: Orthopedics;  Laterality: Left;   TUBAL LIGATION       Social History   Tobacco Use   Smoking status: Former    Current packs/day: 0.00    Average packs/day: 1 pack/day for 12.0 years (12.0 ttl pk-yrs)    Types: Cigarettes    Start date: 06/25/1973    Quit date: 06/25/1985    Years since quitting: 38.5   Smokeless tobacco: Never  Vaping Use   Vaping status: Never Used  Substance Use Topics   Alcohol  use: Yes    Comment: rare wine   Drug use: No       Family History  Problem Relation Age of Onset   Breast cancer Paternal Grandmother 93   Breast cancer Maternal  Grandmother 27   Breast cancer Sister 80     Allergies  Allergen Reactions   Penicillins Swelling and Rash    IgE = 9 (WNL) on 03/31/2023   Celebrex [Celecoxib] Nausea Only and Other (See Comments)    Precipitates migraines with extended use    Doxycycline      Headache   Tramadol  Other (See Comments)    headaches   Advil [Ibuprofen] Rash    Facial swelling   Clindamycin /Lincomycin Nausea And Vomiting    headaches   Meloxicam     Causes headaches when taken for an extended period      REVIEW OF SYSTEMS (Negative unless checked)  Constitutional: [] Weight loss  [] Fever  [] Chills Cardiac: [] Chest pain   [] Chest pressure   [] Palpitations   [] Shortness of breath when laying flat   [] Shortness of breath at rest   [] Shortness of breath with exertion. Vascular:  [] Pain in legs with walking   [] Pain in legs at rest   [] Pain in legs when laying flat   [] Claudication   [] Pain in feet when walking  [] Pain in feet at rest  [] Pain in feet when laying flat   [] History of DVT   [] Phlebitis   [] Swelling in legs   [] Varicose veins   [] Non-healing ulcers Pulmonary:   [] Uses home oxygen   [] Productive cough   [] Hemoptysis   [] Wheeze  [] COPD   [] Asthma Neurologic:  [x] Dizziness  [] Blackouts   [] Seizures   [] History of stroke   [] History of TIA  [] Aphasia   [] Temporary blindness   [] Dysphagia   [] Weakness or numbness in arms   [] Weakness or numbness in legs Musculoskeletal:  [] Arthritis   [] Joint swelling   [] Joint pain   [] Low back pain Hematologic:  [] Easy bruising  [] Easy bleeding   [] Hypercoagulable state   [] Anemic   Gastrointestinal:  [] Blood in stool   [] Vomiting blood  [] Gastroesophageal reflux/heartburn   [] Abdominal pain Genitourinary:  [] Chronic kidney disease   [] Difficult urination  [] Frequent urination  [] Burning with urination   [] Hematuria Skin:  [] Rashes   [] Ulcers   [] Wounds Psychological:  [x] History of anxiety   []  History of major depression.  Physical Examination  BP (!) 168/74    Pulse (!) 57   Resp 18   Ht 5' (1.524 m)   Wt 173 lb 6.4 oz (78.7 kg)   BMI 33.86 kg/m  Gen:  WD/WN, NAD. Appears younger than stated  age. Head: [place]/AT, No temporalis wasting. Ear/Nose/Throat: Hearing grossly intact, nares w/o erythema or drainage Eyes: Conjunctiva clear. Sclera non-icteric Neck: Supple.  Trachea midline Pulmonary:  Good air movement, no use of accessory muscles.  Cardiac: somewhat bradycardic Vascular:  Vessel Right Left  Radial Palpable Palpable                   Musculoskeletal: M/S 5/5 throughout.  No deformity or atrophy. No edema. Neurologic: Sensation grossly intact in extremities.  Symmetrical.  Speech is fluent.  Psychiatric: Judgment intact, Mood & affect appropriate for pt's clinical situation. Dermatologic: No rashes or ulcers noted.  No cellulitis or open wounds.      Labs No results found for this or any previous visit (from the past 2160 hours).  Radiology No results found.  Assessment/Plan  Hyperlipidemia, unspecified lipid control important in reducing the progression of atherosclerotic disease. 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 Better after CEA   Carotid artery stenosis Duplex today shows normal velocities in her right ICA status post endarterectomy with stable 1 to 39% left ICA stenosis.  Doing well.  No change in medical regimen.  Recheck in 1 year   Mikki Alexander, MD  01/05/2024 1:01 PM    This note was created with Dragon medical transcription system.  Any errors from dictation are purely unintentional

## 2024-01-12 ENCOUNTER — Encounter (INDEPENDENT_AMBULATORY_CARE_PROVIDER_SITE_OTHER): Payer: Self-pay

## 2024-01-22 ENCOUNTER — Other Ambulatory Visit: Payer: Self-pay | Admitting: Internal Medicine

## 2024-01-22 DIAGNOSIS — Z1231 Encounter for screening mammogram for malignant neoplasm of breast: Secondary | ICD-10-CM

## 2024-02-11 ENCOUNTER — Ambulatory Visit
Admission: RE | Admit: 2024-02-11 | Discharge: 2024-02-11 | Disposition: A | Discharge status: Home / Self Care | Source: Ambulatory Visit | Attending: Internal Medicine | Admitting: Internal Medicine

## 2024-02-11 DIAGNOSIS — Z1231 Encounter for screening mammogram for malignant neoplasm of breast: Secondary | ICD-10-CM | POA: Insufficient documentation

## 2025-01-03 ENCOUNTER — Ambulatory Visit (INDEPENDENT_AMBULATORY_CARE_PROVIDER_SITE_OTHER): Payer: PRIVATE HEALTH INSURANCE | Admitting: Vascular Surgery

## 2025-01-03 ENCOUNTER — Encounter (INDEPENDENT_AMBULATORY_CARE_PROVIDER_SITE_OTHER)
